# Patient Record
Sex: Male | Born: 1975 | Race: White | Hispanic: No | Marital: Married | Smoking: Former smoker
Health system: Southern US, Community
[De-identification: ages and names within clinical notes are randomized; demographics above are authoritative.]

## PROBLEM LIST (undated history)

## (undated) DIAGNOSIS — E785 Hyperlipidemia, unspecified: Secondary | ICD-10-CM

## (undated) DIAGNOSIS — Z720 Tobacco use: Secondary | ICD-10-CM

## (undated) DIAGNOSIS — I219 Acute myocardial infarction, unspecified: Secondary | ICD-10-CM

## (undated) DIAGNOSIS — Z8719 Personal history of other diseases of the digestive system: Secondary | ICD-10-CM

## (undated) DIAGNOSIS — K219 Gastro-esophageal reflux disease without esophagitis: Secondary | ICD-10-CM

## (undated) DIAGNOSIS — I10 Essential (primary) hypertension: Secondary | ICD-10-CM

## (undated) DIAGNOSIS — F32A Depression, unspecified: Secondary | ICD-10-CM

## (undated) DIAGNOSIS — J302 Other seasonal allergic rhinitis: Secondary | ICD-10-CM

## (undated) DIAGNOSIS — Q8740 Marfan's syndrome, unspecified: Secondary | ICD-10-CM

## (undated) DIAGNOSIS — M25561 Pain in right knee: Secondary | ICD-10-CM

## (undated) DIAGNOSIS — Z872 Personal history of diseases of the skin and subcutaneous tissue: Secondary | ICD-10-CM

## (undated) DIAGNOSIS — R7301 Impaired fasting glucose: Secondary | ICD-10-CM

## (undated) DIAGNOSIS — G8929 Other chronic pain: Secondary | ICD-10-CM

## (undated) HISTORY — DX: Other seasonal allergic rhinitis: J30.2

## (undated) HISTORY — DX: Impaired fasting glucose: R73.01

## (undated) HISTORY — DX: Essential (primary) hypertension: I10

## (undated) HISTORY — DX: Hyperlipidemia, unspecified: E78.5

## (undated) HISTORY — DX: Tobacco use: Z72.0

## (undated) HISTORY — DX: Marfan syndrome, unspecified: Q87.40

## (undated) HISTORY — DX: Pain in right knee: M25.561

## (undated) HISTORY — DX: Acute myocardial infarction, unspecified: I21.9

## (undated) HISTORY — DX: Depression, unspecified: F32.A

## (undated) HISTORY — DX: Personal history of diseases of the skin and subcutaneous tissue: Z87.2

## (undated) HISTORY — PX: TONSILECTOMY, ADENOIDECTOMY, BILATERAL MYRINGOTOMY AND TUBES: SHX2538

## (undated) HISTORY — DX: Personal history of other diseases of the digestive system: Z87.19

## (undated) HISTORY — DX: Gastro-esophageal reflux disease without esophagitis: K21.9

## (undated) HISTORY — DX: Other chronic pain: G89.29

---

## 1998-06-06 HISTORY — PX: OTHER SURGICAL HISTORY: SHX169

## 2004-02-06 HISTORY — PX: AORTIC VALVE SURGERY: SHX549

## 2006-07-30 ENCOUNTER — Ambulatory Visit: Payer: Self-pay | Admitting: Family Medicine

## 2012-02-06 DIAGNOSIS — Z8711 Personal history of peptic ulcer disease: Secondary | ICD-10-CM

## 2012-02-06 HISTORY — DX: Personal history of peptic ulcer disease: Z87.11

## 2012-06-27 ENCOUNTER — Ambulatory Visit: Payer: Self-pay | Admitting: Physician Assistant

## 2012-06-27 ENCOUNTER — Ambulatory Visit (INDEPENDENT_AMBULATORY_CARE_PROVIDER_SITE_OTHER): Payer: PRIVATE HEALTH INSURANCE | Admitting: Physician Assistant

## 2012-06-27 ENCOUNTER — Encounter: Payer: Self-pay | Admitting: Physician Assistant

## 2012-06-27 VITALS — BP 132/81 | HR 60 | Temp 97.0°F | Ht >= 80 in | Wt 300.0 lb

## 2012-06-27 DIAGNOSIS — I10 Essential (primary) hypertension: Secondary | ICD-10-CM | POA: Insufficient documentation

## 2012-06-27 DIAGNOSIS — M25569 Pain in unspecified knee: Secondary | ICD-10-CM

## 2012-06-27 DIAGNOSIS — F411 Generalized anxiety disorder: Secondary | ICD-10-CM | POA: Insufficient documentation

## 2012-06-27 MED ORDER — CITALOPRAM HYDROBROMIDE 20 MG PO TABS: 40.0000 mg | ORAL_TABLET | Freq: Every day | ORAL | Status: DC

## 2012-06-27 MED ORDER — METOPROLOL TARTRATE 100 MG PO TABS: 100.0000 mg | ORAL_TABLET | Freq: Two times a day (BID) | ORAL | Status: DC

## 2012-06-27 NOTE — Progress Notes (Signed)
Subjective:     Patient ID: Carl Bruce, male   DOB: [birth year], 37 y.o.   MRN: [id number]  HPI Pt here for review of HTN, anxiety, and knee pain He states he has been doing well Pt had episode of chest pain ~ 3 weeks ago and went to the ER Full work up there was negative and it was felt sx were due to stress He states he has been working a lot recently and stress at the work place has been high He has noted a shorter fuse than usually due to the stress His knee pain has been pretty good and he is having more issues with his feet than knees Pt has appt with his Card in the future regarding his aortic arch   Review of Systems  All other systems reviewed and are negative.       Objective:   Physical Exam  Nursing note and vitals reviewed. Constitutional: He appears well-developed and well-nourished.  Neck: Neck supple. No JVD present. No tracheal deviation present.  No bruits  Cardiovascular: Normal rate, regular rhythm, normal heart sounds and intact distal pulses.   No lower ext edema  Pulmonary/Chest: Effort normal and breath sounds normal.  Lymphadenopathy:    He has no cervical adenopathy.  Psychiatric: He has a normal mood and affect. His behavior is normal. Judgment and thought content normal.       Assessment:     1. HTN (hypertension)   2. Anxiety state, unspecified   3. Knee pain, unspecified laterality        Plan:     Will increase the Celexa to 40mg  qd Cont other meds Will get EKG/labs from recent hosp eval Cont regular f/u with Card Cont other meds

## 2012-06-27 NOTE — Patient Instructions (Signed)

## 2012-07-10 ENCOUNTER — Ambulatory Visit (INDEPENDENT_AMBULATORY_CARE_PROVIDER_SITE_OTHER): Payer: PRIVATE HEALTH INSURANCE | Admitting: Physician Assistant

## 2012-07-10 ENCOUNTER — Encounter: Payer: Self-pay | Admitting: Physician Assistant

## 2012-07-10 VITALS — BP 134/81 | HR 64 | Temp 97.2°F | Ht >= 80 in | Wt 310.0 lb

## 2012-07-10 DIAGNOSIS — M25561 Pain in right knee: Secondary | ICD-10-CM

## 2012-07-10 DIAGNOSIS — M25569 Pain in unspecified knee: Secondary | ICD-10-CM

## 2012-07-10 NOTE — Progress Notes (Signed)
  Subjective:    Patient ID: Carl Bruce, male    DOB: 1975/07/12, 37 y.o.   MRN: [id number]  HPI Pt with chronic intermit R knee pain He has been doing well with intermit use of Mobic There has been a recent flare of his R knee pain since having to kneel under a machine at work He would like injection that prev has worked well   Review of Systems  All other systems reviewed and are negative.       Objective:   Physical Exam  Nursing note and vitals reviewed. + effusion to the R knee FROM of the knee Sl click No laxity noted + TTP ant medial/lateral knee Good strength Betadine pre done to knee 1.5cc Mar/1.5cc Kenalog under sterile techniq. to the R knee Bandage placed        Assessment & Plan:  A- chronic knee pain P- Cont Mobic      Heat/Ice      Try to minimize kneeling      F/U prn

## 2012-07-10 NOTE — Patient Instructions (Signed)
Knee Pain  The knee is the complex joint between your thigh and your lower leg. It is made up of bones, tendons, ligaments, and cartilage. The bones that make up the knee are:   The femur in the thigh.   The tibia and fibula in the lower leg.   The patella or kneecap riding in the groove on the lower femur.  CAUSES   Knee pain is a common complaint with many causes. A few of these causes are:   Injury, such as:   A ruptured ligament or tendon injury.   Torn cartilage.   Medical conditions, such as:   Gout   Arthritis   Infections   Overuse, over training or overdoing a physical activity.  Knee pain can be minor or severe. Knee pain can accompany debilitating injury. Minor knee problems often respond well to self-care measures or get well on their own. More serious injuries may need medical intervention or even surgery.  SYMPTOMS  The knee is complex. Symptoms of knee problems can vary widely. Some of the problems are:   Pain with movement and weight bearing.   Swelling and tenderness.   Buckling of the knee.   Inability to straighten or extend your knee.   Your knee locks and you cannot straighten it.   Warmth and redness with pain and fever.   Deformity or dislocation of the kneecap.  DIAGNOSIS   Determining what is wrong may be very straight forward such as when there is an injury. It can also be challenging because of the complexity of the knee. Tests to make a diagnosis may include:   Your caregiver taking a history and doing a physical exam.   Routine X-rays can be used to rule out other problems. X-rays will not reveal a cartilage tear. Some injuries of the knee can be diagnosed by:   Arthroscopy a surgical technique by which a small video camera is inserted through tiny incisions on the sides of the knee. This procedure is used to examine and repair internal knee joint problems. Tiny instruments can be used during arthroscopy to repair the torn knee cartilage (meniscus).   Arthrography  is a radiology technique. A contrast liquid is directly injected into the knee joint. Internal structures of the knee joint then become visible on X-ray film.   An MRI scan is a non x-ray radiology procedure in which magnetic fields and a computer produce two- or three-dimensional images of the inside of the knee. Cartilage tears are often visible using an MRI scanner. MRI scans have largely replaced arthrography in diagnosing cartilage tears of the knee.   Blood work.   Examination of the fluid that helps to lubricate the knee joint (synovial fluid). This is done by taking a sample out using a needle and a syringe.  TREATMENT  The treatment of knee problems depends on the cause. Some of these treatments are:   Depending on the injury, proper casting, splinting, surgery or physical therapy care will be needed.   Give yourself adequate recovery time. Do not overuse your joints. If you begin to get sore during workout routines, back off. Slow down or do fewer repetitions.   For repetitive activities such as cycling or running, maintain your strength and nutrition.   Alternate muscle groups. For example if you are a weight lifter, work the upper body on one day and the lower body the next.   Either tight or weak muscles do not give the proper support for your   knee. Tight or weak muscles do not absorb the stress placed on the knee joint. Keep the muscles surrounding the knee strong.   Take care of mechanical problems.   If you have flat feet, orthotics or special shoes may help. See your caregiver if you need help.   Arch supports, sometimes with wedges on the inner or outer aspect of the heel, can help. These can shift pressure away from the side of the knee most bothered by osteoarthritis.   A brace called an "unloader" brace also may be used to help ease the pressure on the most arthritic side of the knee.   If your caregiver has prescribed crutches, braces, wraps or ice, use as directed. The acronym for  this is PRICE. This means protection, rest, ice, compression and elevation.   Nonsteroidal anti-inflammatory drugs (NSAID's), can help relieve pain. But if taken immediately after an injury, they may actually increase swelling. Take NSAID's with food in your stomach. Stop them if you develop stomach problems. Do not take these if you have a history of ulcers, stomach pain or bleeding from the bowel. Do not take without your caregiver's approval if you have problems with fluid retention, heart failure, or kidney problems.   For ongoing knee problems, physical therapy may be helpful.   Glucosamine and chondroitin are over-the-counter dietary supplements. Both may help relieve the pain of osteoarthritis in the knee. These medicines are different from the usual anti-inflammatory drugs. Glucosamine may decrease the rate of cartilage destruction.   Injections of a corticosteroid drug into your knee joint may help reduce the symptoms of an arthritis flare-up. They may provide pain relief that lasts a few months. You may have to wait a few months between injections. The injections do have a small increased risk of infection, water retention and elevated blood sugar levels.   Hyaluronic acid injected into damaged joints may ease pain and provide lubrication. These injections may work by reducing inflammation. A series of shots may give relief for as long as 6 months.   Topical painkillers. Applying certain ointments to your skin may help relieve the pain and stiffness of osteoarthritis. Ask your pharmacist for suggestions. Many over the-counter products are approved for temporary relief of arthritis pain.   In some countries, doctors often prescribe topical NSAID's for relief of chronic conditions such as arthritis and tendinitis. A review of treatment with NSAID creams found that they worked as well as oral medications but without the serious side effects.  PREVENTION   Maintain a healthy weight. Extra pounds put  more strain on your joints.   Get strong, stay limber. Weak muscles are a common cause of knee injuries. Stretching is important. Include flexibility exercises in your workouts.   Be smart about exercise. If you have osteoarthritis, chronic knee pain or recurring injuries, you may need to change the way you exercise. This does not mean you have to stop being active. If your knees ache after jogging or playing basketball, consider switching to swimming, water aerobics or other low-impact activities, at least for a few days a week. Sometimes limiting high-impact activities will provide relief.   Make sure your shoes fit well. Choose footwear that is right for your sport.   Protect your knees. Use the proper gear for knee-sensitive activities. Use kneepads when playing volleyball or laying carpet. Buckle your seat belt every time you drive. Most shattered kneecaps occur in car accidents.   Rest when you are tired.  SEEK MEDICAL CARE IF:     You have knee pain that is continual and does not seem to be getting better.   SEEK IMMEDIATE MEDICAL CARE IF:   Your knee joint feels hot to the touch and you have a high fever.  MAKE SURE YOU:    Understand these instructions.   Will watch your condition.   Will get help right away if you are not doing well or get worse.  Document Released: 11/19/2006 Document Revised: 04/16/2011 Document Reviewed: 11/19/2006  [employer] Patient Information 2014 [employer].

## 2012-09-02 ENCOUNTER — Telehealth: Payer: Self-pay | Admitting: *Deleted

## 2012-09-02 NOTE — Telephone Encounter (Signed)
Please set up for referral Knee pain failure of NSAIDS and joint injections

## 2012-09-02 NOTE — Telephone Encounter (Signed)
Can we please referral him to [facility] here at our office for knee pain please.

## 2012-09-05 ENCOUNTER — Other Ambulatory Visit: Payer: Self-pay

## 2012-09-05 DIAGNOSIS — M25561 Pain in right knee: Secondary | ICD-10-CM

## 2012-09-11 ENCOUNTER — Other Ambulatory Visit: Payer: Self-pay | Admitting: Orthopedic Surgery

## 2012-09-11 ENCOUNTER — Ambulatory Visit (INDEPENDENT_AMBULATORY_CARE_PROVIDER_SITE_OTHER): Payer: PRIVATE HEALTH INSURANCE

## 2012-09-11 DIAGNOSIS — R52 Pain, unspecified: Secondary | ICD-10-CM

## 2012-11-04 ENCOUNTER — Encounter: Payer: Self-pay | Admitting: Nurse Practitioner

## 2012-11-04 ENCOUNTER — Ambulatory Visit (INDEPENDENT_AMBULATORY_CARE_PROVIDER_SITE_OTHER): Payer: PRIVATE HEALTH INSURANCE | Admitting: Nurse Practitioner

## 2012-11-04 ENCOUNTER — Ambulatory Visit (INDEPENDENT_AMBULATORY_CARE_PROVIDER_SITE_OTHER): Payer: PRIVATE HEALTH INSURANCE

## 2012-11-04 VITALS — BP 149/84 | HR 69 | Temp 97.1°F | Ht >= 80 in | Wt 313.0 lb

## 2012-11-04 DIAGNOSIS — M79609 Pain in unspecified limb: Secondary | ICD-10-CM

## 2012-11-04 DIAGNOSIS — M79641 Pain in right hand: Secondary | ICD-10-CM

## 2012-11-04 IMAGING — CR DG HAND COMPLETE 3+V*R*
3 series · 3 of 3 positions shown · non-contrast
Comparison: None.

CLINICAL DATA: Pain

EXAM:
RIGHT HAND - COMPLETE 3+ VIEW

[view not recorded (1 of 3)]
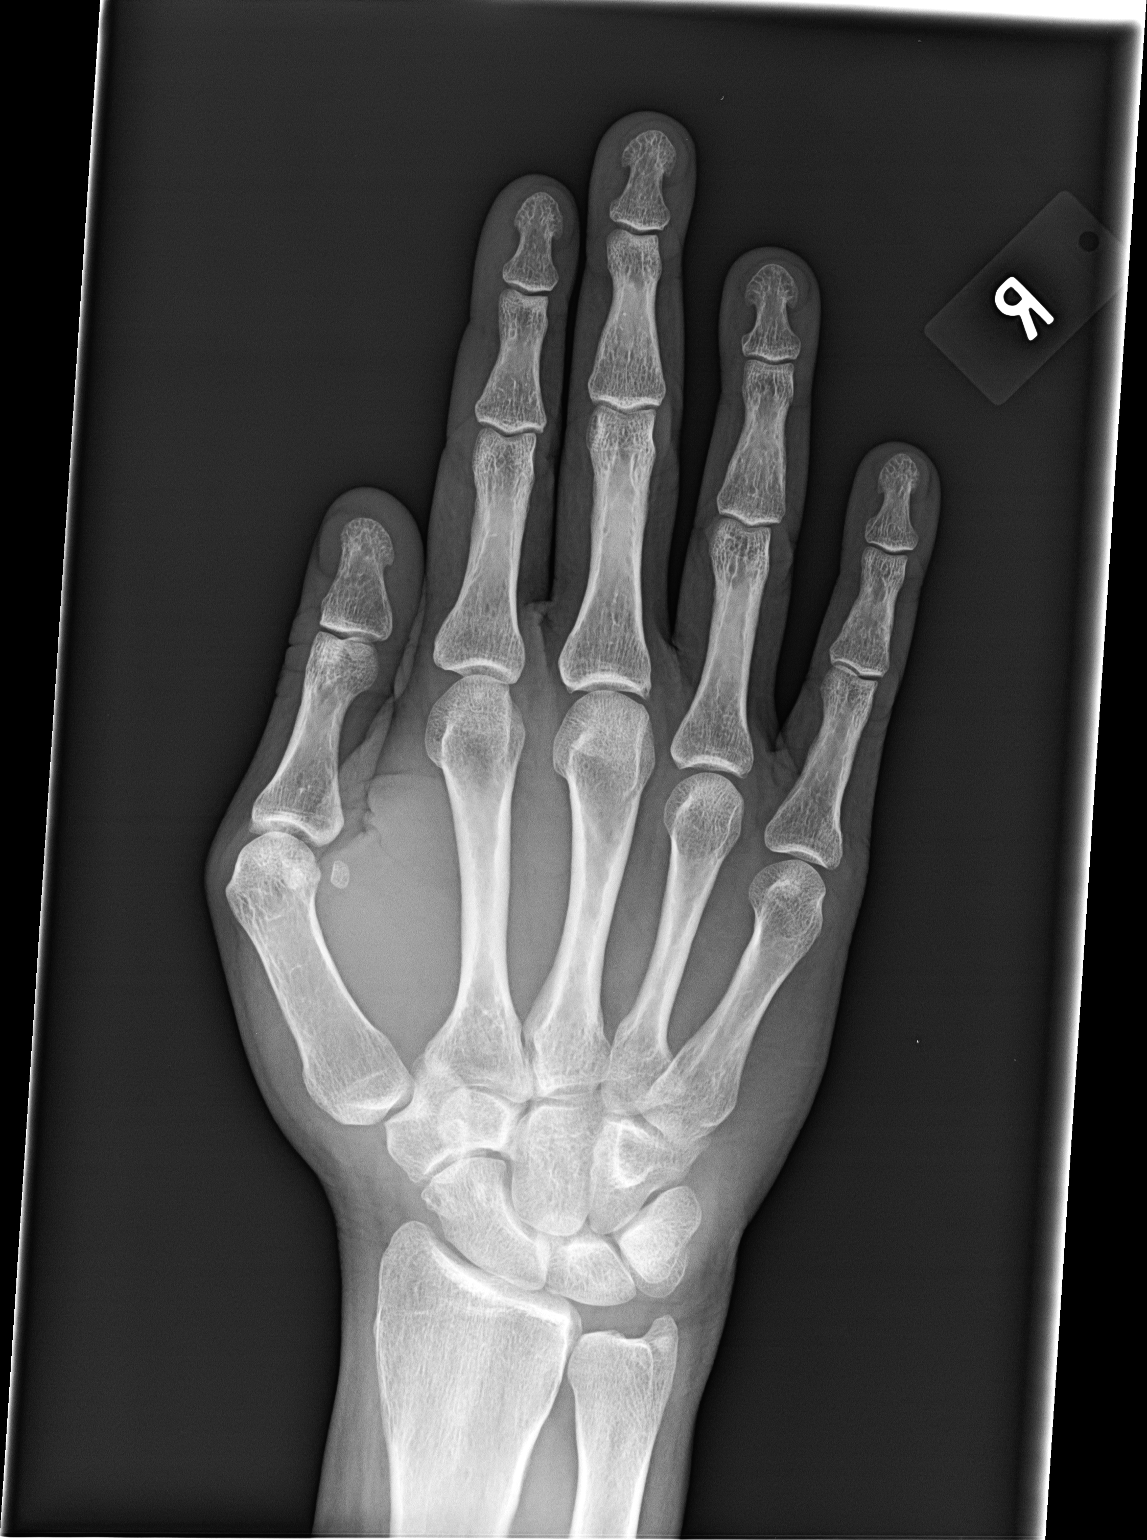

[view not recorded (2 of 3)]
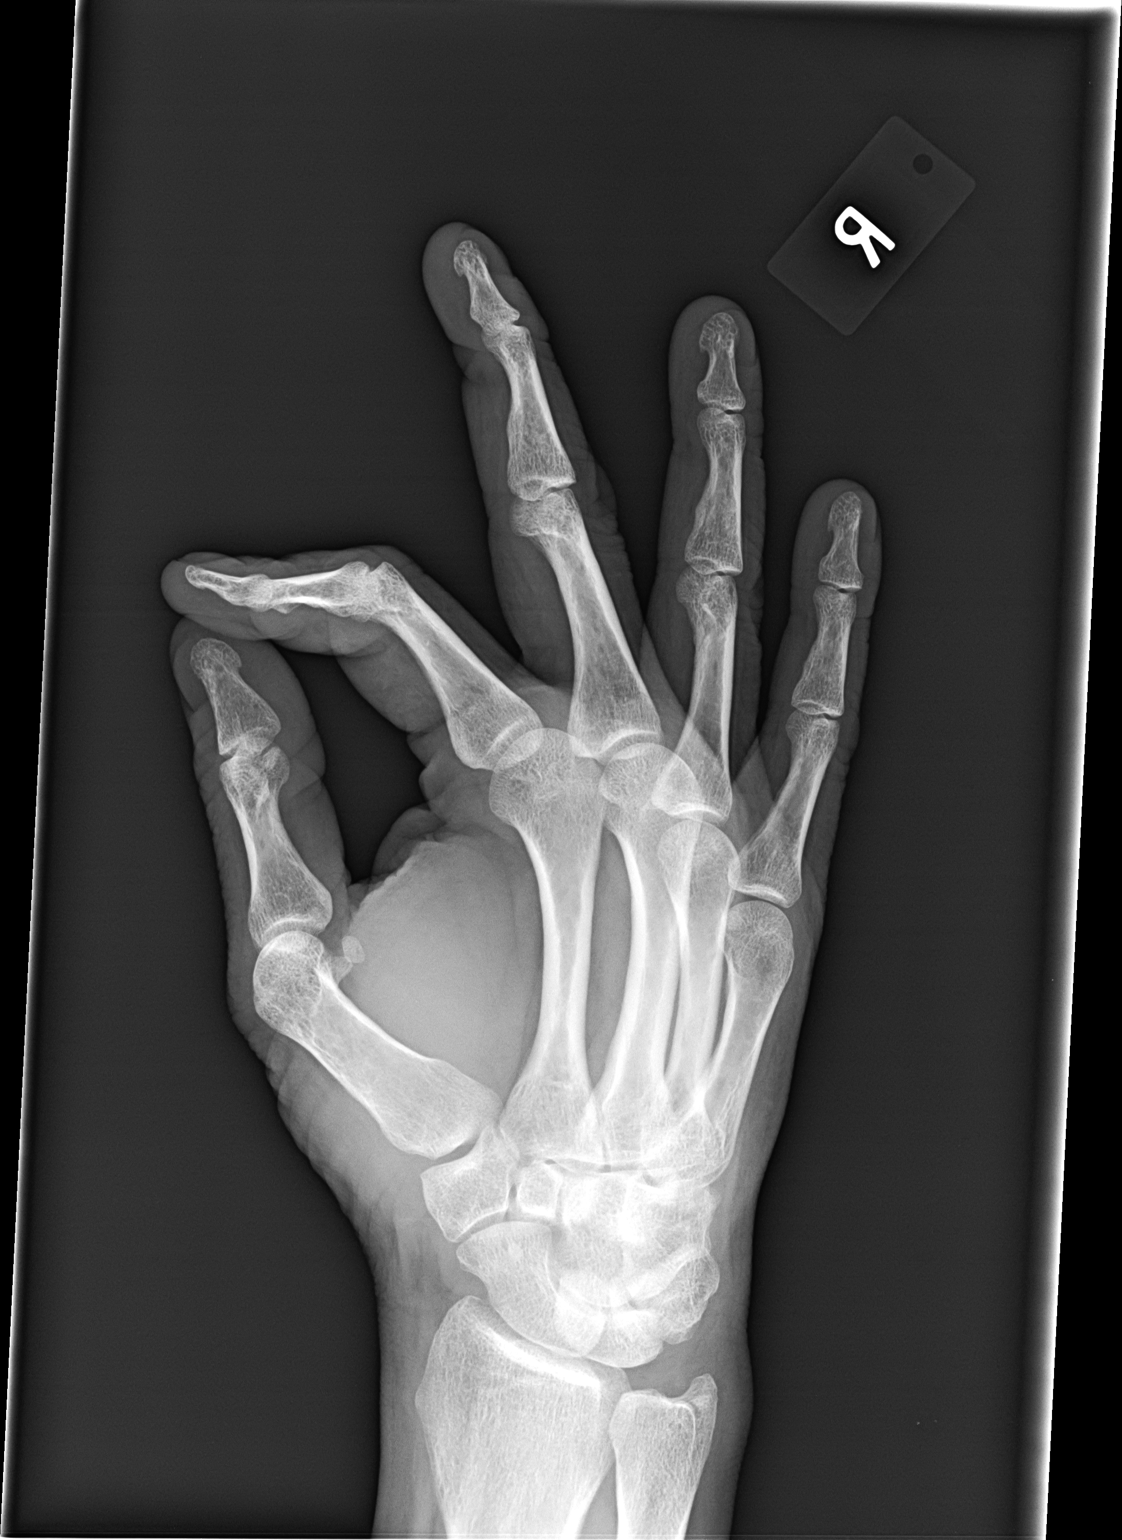

[view not recorded (3 of 3)]
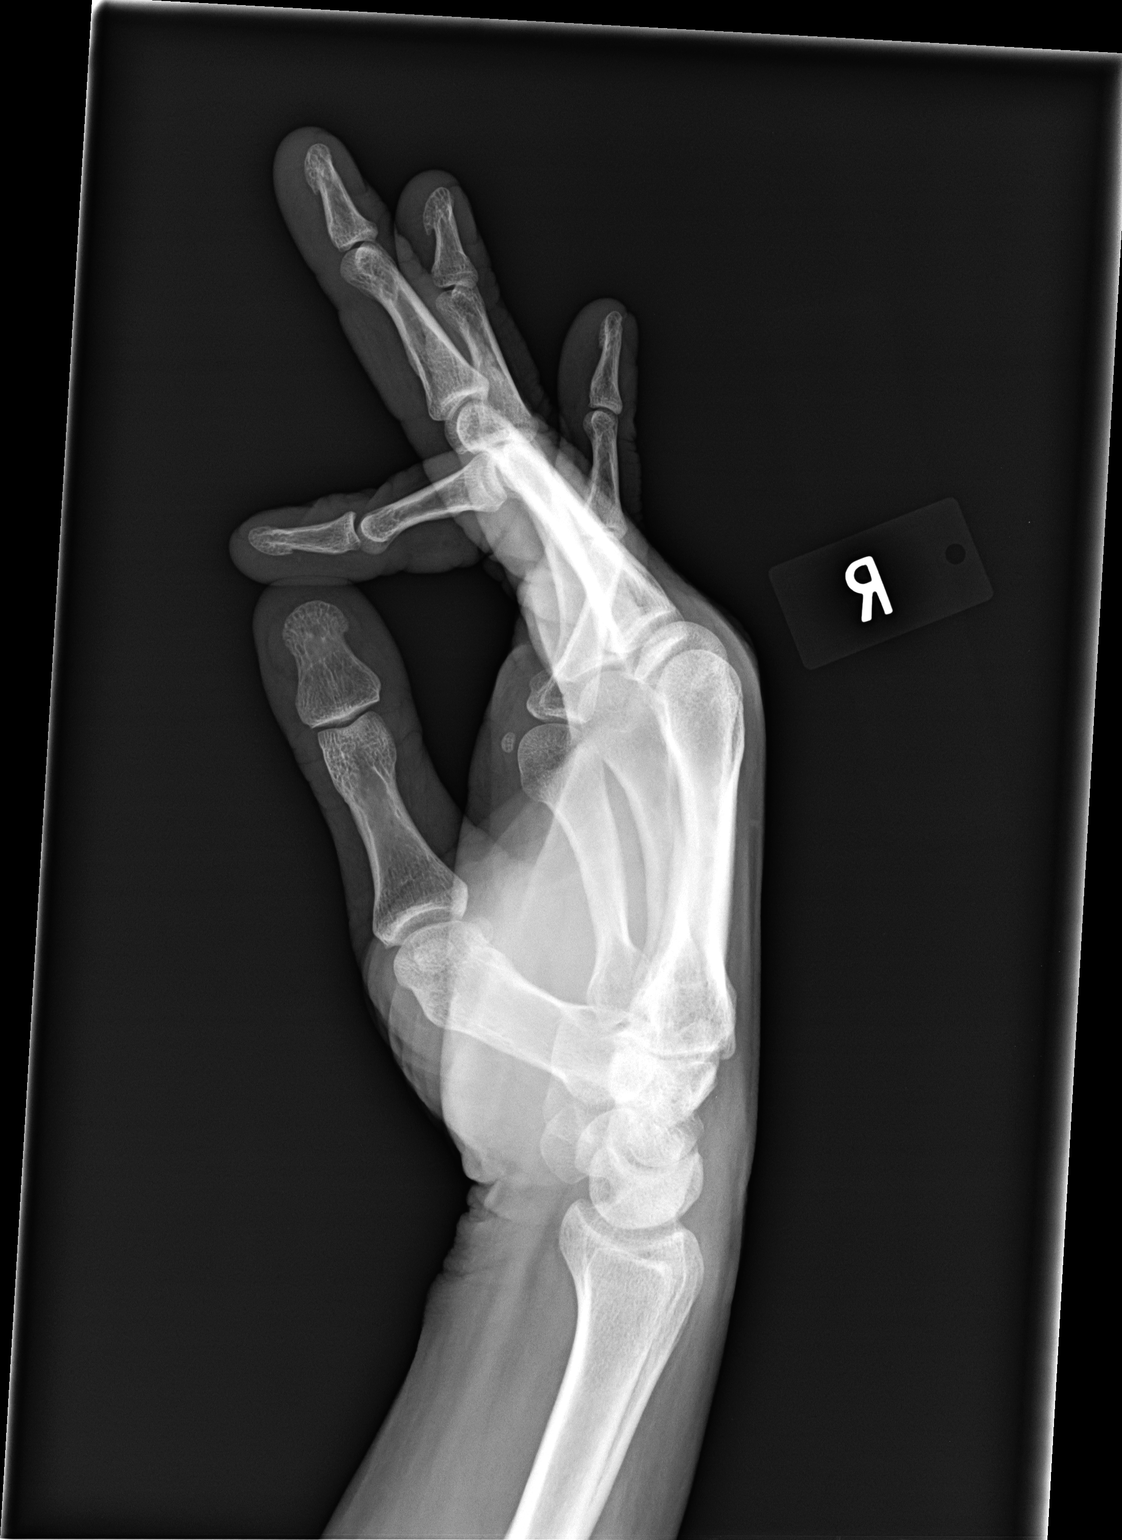

[3 of 3 positions shown; findings below may reference images not displayed]

FINDINGS: Frontal, oblique, and lateral views were obtained. There is no
fracture or dislocation. The joint spaces appear intact. No erosive
change.
IMPRESSION: No fracture or appreciable arthropathy. Note that on submitted
images, the patient does persistently flex at the 1st MCP joint. The
significance of this finding is uncertain.

## 2012-11-04 MED ORDER — HYDROCODONE-ACETAMINOPHEN 5-325 MG PO TABS: 1.0000 | ORAL_TABLET | Freq: Four times a day (QID) | ORAL | Status: DC | PRN

## 2012-11-04 MED ORDER — PREDNISONE (PAK) 10 MG PO TABS: 10.0000 mg | ORAL_TABLET | Freq: Every day | ORAL | Status: DC

## 2012-11-04 NOTE — Patient Instructions (Signed)
Arthritis, Nonspecific  Arthritis is pain, redness, warmth, or puffiness (inflammation) of a joint. The joint may be stiff or hurt when you move it. One or more joints may be affected. There are many types of arthritis. Your doctor may not know what type you have right away. The most common cause of arthritis is wear and tear on the joint (osteoarthritis).  HOME CARE   · Only take medicine as told by your doctor.  · Rest the joint as much as possible.  · Raise (elevate) your joint if it is puffy.  · Use crutches if the painful joint is in your leg.  · Drink enough fluids to keep your pee (urine) clear or pale yellow.  · Follow your doctor's diet instructions.  · Use cold packs for very bad joint pain for 10 to 15 minutes every hour. Ask your doctor if it is okay for you to use hot packs.  · Exercise as told by your doctor.  · Take a warm shower if you have stiffness in the morning.  · Move your sore joints throughout the day.  GET HELP RIGHT AWAY IF:   · You have a fever.  · You have very bad joint pain, puffiness, or redness.  · You have many joints that are painful and puffy.  · You are not getting better with treatment.  · You have very bad back pain or leg weakness.  · You cannot control when you poop (bowel movement) or pee (urinate).  · You do not feel better in 24 hours or are getting worse.  · You are having side effects from your medicine.  MAKE SURE YOU:   · Understand these instructions.  · Will watch your condition.  · Will get help right away if you are not doing well or get worse.  Document Released: 04/18/2009 Document Revised: 07/24/2011 Document Reviewed: 04/18/2009  [employer]® Patient Information ©2014 [employer].

## 2012-11-04 NOTE — Progress Notes (Signed)
  Subjective:    Patient ID: Carl Bruce, male    DOB: 1975/07/18, 37 y.o.   MRN: [id number]  HPI Patient in c/o pain in right hand- started several days ago- any movement of his hand causes pain.    Review of Systems  All other systems reviewed and are negative.       Objective:   Physical Exam  Constitutional: He appears well-developed and well-nourished.  Cardiovascular: Normal rate, regular rhythm and normal heart sounds.   Pulmonary/Chest: Effort normal and breath sounds normal.  Musculoskeletal:  Mild edema right 1st and 2nd metacarpal heads- pain on full extension of fingers.  Grips + bil.   Xray- no fracture- Preliminary reading by Paulene Floor, FNP  [facility] BP 149/84  Pulse 69  Temp(Src) 97.1 F (36.2 C) (Oral)  Ht 7' (2.134 m)  Wt 313 lb (141.976 kg)  BMI 31.18 kg/m2        Assessment & Plan:   1. Hand pain, right    Orders Placed This Encounter  Procedures  . DG Hand Complete Right    Standing Status: Future     Number of Occurrences: 1     Standing Expiration Date: 01/04/2014    Order Specific Question:  Reason for Exam (SYMPTOM  OR DIAGNOSIS REQUIRED)    Answer:  pain    Order Specific Question:  Preferred imaging location?    Answer:  Internal  . Arthritis Panel   Meds ordered this encounter  Medications  . predniSONE (STERAPRED UNI-PAK) 10 MG tablet    Sig: Take 1 tablet (10 mg total) by mouth daily. As directed X6 days    Dispense:  21 tablet    Refill:  0    Order Specific Question:  Supervising Provider    Answer:  Ernestina Penna [[id number]]  . HYDROcodone-acetaminophen (NORCO/VICODIN) 5-325 MG per tablet    Sig: Take 1 tablet by mouth every 6 (six) hours as needed for pain.    Dispense:  30 tablet    Refill:  0    Order Specific Question:  Supervising Provider    Answer:  Ernestina Penna [[id number]]  rest Ice hand if helps  Mary-Margaret Daphine Deutscher, FNP

## 2012-11-05 LAB — ARTHRITIS PANEL
Basos: 1 %
Eosinophils Absolute: 0.4 10*3/uL (ref 0.0–0.4)
HCT: 43.1 % (ref 37.5–51.0)
Hemoglobin: 14.3 g/dL (ref 12.6–17.7)
MCH: 27.5 pg (ref 26.6–33.0)
MCHC: 33.2 g/dL (ref 31.5–35.7)
Monocytes Absolute: 0.7 10*3/uL (ref 0.1–0.9)
Neutrophils Absolute: 4.8 10*3/uL (ref 1.4–7.0)
Sed Rate: 3 mm/hr (ref 0–15)
WBC: 8.8 10*3/uL (ref 3.4–10.8)

## 2013-01-16 ENCOUNTER — Encounter: Payer: Self-pay | Admitting: Nurse Practitioner

## 2013-01-16 ENCOUNTER — Ambulatory Visit (INDEPENDENT_AMBULATORY_CARE_PROVIDER_SITE_OTHER): Payer: PRIVATE HEALTH INSURANCE | Admitting: Nurse Practitioner

## 2013-01-16 VITALS — BP 138/77 | HR 73 | Temp 97.2°F | Ht >= 80 in | Wt 311.0 lb

## 2013-01-16 DIAGNOSIS — I10 Essential (primary) hypertension: Secondary | ICD-10-CM

## 2013-01-16 DIAGNOSIS — Z23 Encounter for immunization: Secondary | ICD-10-CM

## 2013-01-16 DIAGNOSIS — Q8740 Marfan's syndrome, unspecified: Secondary | ICD-10-CM

## 2013-01-16 DIAGNOSIS — Z0000 Encounter for general adult medical examination without abnormal findings: Secondary | ICD-10-CM

## 2013-01-16 DIAGNOSIS — Q87410 Marfan's syndrome with aortic dilation: Secondary | ICD-10-CM | POA: Insufficient documentation

## 2013-01-16 NOTE — Patient Instructions (Signed)

## 2013-01-16 NOTE — Addendum Note (Signed)
Addended by: Almeta Monas on: 01/16/2013 11:04 AM   Modules accepted: Orders

## 2013-01-16 NOTE — Progress Notes (Signed)
   Subjective:    Patient ID: Carl Bruce, male    DOB: October 04, 1975, 37 y.o.   MRN: [id number]  HPI Patient here today for CPE- he is doing well without complaints today.  Patient Active Problem List   Diagnosis Date Noted  . Marfan's syndrome with aortic dilation 01/16/2013  . HTN (hypertension) 06/27/2012  . Anxiety state, unspecified 06/27/2012    Outpatient Encounter Prescriptions as of 01/16/2013  Medication Sig  . citalopram (CELEXA) 20 MG tablet Take 20 mg by mouth daily.  . meloxicam (MOBIC) 15 MG tablet Take 15 mg by mouth as needed for pain.  . metoprolol (LOPRESSOR) 100 MG tablet Take 1 tablet (100 mg total) by mouth 2 (two) times daily.  . [DISCONTINUED] HYDROcodone-acetaminophen (NORCO/VICODIN) 5-325 MG per tablet Take 1 tablet by mouth every 6 (six) hours as needed for pain.  . [DISCONTINUED] predniSONE (STERAPRED UNI-PAK) 10 MG tablet Take 1 tablet (10 mg total) by mouth daily. As directed X6 days        Review of Systems  Constitutional: Negative.   HENT: Negative.   Respiratory: Negative.   Cardiovascular: Negative.   Gastrointestinal: Negative.   Genitourinary: Negative.   Musculoskeletal: Negative.   Neurological: Negative.   Hematological: Negative.   Psychiatric/Behavioral: Negative.   All other systems reviewed and are negative.       Objective:   Physical Exam  Constitutional: He is oriented to person, place, and time. He appears well-developed and well-nourished.  HENT:  Head: Normocephalic.  Right Ear: External ear normal.  Left Ear: External ear normal.  Nose: Nose normal.  Mouth/Throat: Oropharynx is clear and moist.  Eyes: EOM are normal. Pupils are equal, round, and reactive to light.  Neck: Normal range of motion. Neck supple. No JVD present. No thyromegaly present.  Cardiovascular: Normal rate, regular rhythm, normal heart sounds and intact distal pulses.  Exam reveals no gallop and no friction rub.   No murmur  heard. Pulmonary/Chest: Effort normal and breath sounds normal. No respiratory distress. He has no wheezes. He has no rales. He exhibits no tenderness.  Abdominal: Soft. Bowel sounds are normal. He exhibits no mass. There is no tenderness.  Musculoskeletal: Normal range of motion. He exhibits no edema.  Lymphadenopathy:    He has no cervical adenopathy.  Neurological: He is alert and oriented to person, place, and time. No cranial nerve deficit.  Skin: Skin is warm and dry.  Psychiatric: He has a normal mood and affect. His behavior is normal. Judgment and thought content normal.      BP 138/77  Pulse 73  Temp(Src) 97.2 F (36.2 C) (Oral)  Ht 7' (2.134 m)  Wt 311 lb (141.069 kg)  BMI 30.98 kg/m2 EKG- unchanged from previous.    Assessment & Plan:   1. Marfan's syndrome with aortic dilation   2. HTN (hypertension)    Orders Placed This Encounter  Procedures  . CMP14+EGFR  . NMR, lipoprofile  . EKG 12-Lead   Tetanus shot today Continue all meds Labs pending Diet and exercise encouraged Health maintenance reviewed Follow up in 6 months  Mary-Margaret Daphine Deutscher, FNP

## 2013-01-18 LAB — NMR, LIPOPROFILE
Cholesterol: 211 mg/dL — ABNORMAL HIGH (ref <200)
HDL Cholesterol by NMR: 42 mg/dL (ref >=40)
HDL Particle Number: 36.8 umol/L (ref >=30.5)
LP-IR Score: 83 — ABNORMAL HIGH (ref <=45)
Small LDL Particle Number: 1433 nmol/L — ABNORMAL HIGH (ref <=527)

## 2013-01-18 LAB — CMP14+EGFR
ALT: 48 IU/L — ABNORMAL HIGH (ref 0–44)
AST: 25 IU/L (ref 0–40)
Albumin/Globulin Ratio: 2.3 (ref 1.1–2.5)
Albumin: 4.9 g/dL (ref 3.5–5.5)
Alkaline Phosphatase: 78 IU/L (ref 39–117)
BUN/Creatinine Ratio: 14 (ref 8–19)
BUN: 14 mg/dL (ref 6–20)
Calcium: 10.2 mg/dL (ref 8.7–10.2)
Creatinine, Ser: 1 mg/dL (ref 0.76–1.27)
GFR calc Af Amer: 111 mL/min/{1.73_m2} (ref >59)
GFR calc non Af Amer: 96 mL/min/{1.73_m2} (ref >59)
Globulin, Total: 2.1 g/dL (ref 1.5–4.5)
Sodium: 140 mmol/L (ref 134–144)

## 2013-01-20 ENCOUNTER — Telehealth: Payer: Self-pay | Admitting: *Deleted

## 2013-01-20 NOTE — Telephone Encounter (Signed)
Will NTBS to get a work note

## 2013-01-20 NOTE — Telephone Encounter (Signed)
Patient is down with his back and having pain that goes down to his knees. Can he get a work note for a few days until he gets things straightened out. He already has pain meds that were given at last visit. If he needs to be seen he will be happy to come in this afternoon. Not sure if he will use work note but would like to have it just in case. Please advise

## 2013-01-26 NOTE — Telephone Encounter (Signed)
Wife aware

## 2013-04-23 ENCOUNTER — Other Ambulatory Visit: Payer: Self-pay | Admitting: Nurse Practitioner

## 2013-04-23 ENCOUNTER — Other Ambulatory Visit (INDEPENDENT_AMBULATORY_CARE_PROVIDER_SITE_OTHER): Payer: 59

## 2013-04-23 DIAGNOSIS — R5383 Other fatigue: Principal | ICD-10-CM

## 2013-04-23 DIAGNOSIS — R5381 Other malaise: Secondary | ICD-10-CM

## 2013-04-23 NOTE — Progress Notes (Signed)
Patient came in for labs only.

## 2013-04-24 LAB — ANEMIA PROFILE B
Basophils Absolute: 0 10*3/uL (ref 0.0–0.2)
Basos: 0 %
EOS: 5 %
Eosinophils Absolute: 0.3 10*3/uL (ref 0.0–0.4)
FERRITIN: 90 ng/mL (ref 30–400)
Folate: 14.3 ng/mL (ref >3.0)
HCT: 46.2 % (ref 37.5–51.0)
Hemoglobin: 15.8 g/dL (ref 12.6–17.7)
IRON: 69 ug/dL (ref 40–155)
Immature Grans (Abs): 0.1 10*3/uL (ref 0.0–0.1)
Immature Granulocytes: 1 %
Iron Saturation: 21 % (ref 15–55)
LYMPHS ABS: 2.4 10*3/uL (ref 0.7–3.1)
Lymphs: 35 %
MCH: 26.9 pg (ref 26.6–33.0)
MCHC: 34.2 g/dL (ref 31.5–35.7)
MCV: 79 fL (ref 79–97)
Monocytes Absolute: 0.5 10*3/uL (ref 0.1–0.9)
Monocytes: 8 %
Neutrophils Absolute: 3.5 10*3/uL (ref 1.4–7.0)
Neutrophils Relative %: 51 %
Platelets: 246 10*3/uL (ref 150–379)
RBC: 5.87 x10E6/uL — AB (ref 4.14–5.80)
RDW: 14.1 % (ref 12.3–15.4)
Retic Ct Pct: 2.1 % (ref 0.6–2.6)
TIBC: 322 ug/dL (ref 250–450)
UIBC: 253 ug/dL (ref 150–375)
Vitamin B-12: 478 pg/mL (ref 211–946)
WBC: 6.9 10*3/uL (ref 3.4–10.8)

## 2013-04-24 LAB — CMP14+EGFR
ALK PHOS: 89 IU/L (ref 39–117)
ALT: 40 IU/L (ref 0–44)
AST: 22 IU/L (ref 0–40)
Albumin/Globulin Ratio: 1.8 (ref 1.1–2.5)
Albumin: 4.6 g/dL (ref 3.5–5.5)
BILIRUBIN TOTAL: 0.5 mg/dL (ref 0.0–1.2)
BUN/Creatinine Ratio: 11 (ref 8–19)
BUN: 12 mg/dL (ref 6–20)
CO2: 26 mmol/L (ref 18–29)
CREATININE: 1.07 mg/dL (ref 0.76–1.27)
Calcium: 9.9 mg/dL (ref 8.7–10.2)
Chloride: 100 mmol/L (ref 97–108)
GFR calc Af Amer: 102 mL/min/{1.73_m2} (ref >59)
GFR calc non Af Amer: 88 mL/min/{1.73_m2} (ref >59)
Globulin, Total: 2.5 g/dL (ref 1.5–4.5)
Glucose: 94 mg/dL (ref 65–99)
Potassium: 5.2 mmol/L (ref 3.5–5.2)
Sodium: 144 mmol/L (ref 134–144)
Total Protein: 7.1 g/dL (ref 6.0–8.5)

## 2013-04-24 LAB — THYROID PANEL WITH TSH
Free Thyroxine Index: 1.6 (ref 1.2–4.9)
T3 Uptake Ratio: 24 % (ref 24–39)
T4, Total: 6.7 ug/dL (ref 4.5–12.0)
TSH: 2.26 u[IU]/mL (ref 0.450–4.500)

## 2013-05-05 ENCOUNTER — Other Ambulatory Visit: Payer: Self-pay | Admitting: Physician Assistant

## 2013-05-18 ENCOUNTER — Encounter: Payer: Self-pay | Admitting: Nurse Practitioner

## 2013-05-18 ENCOUNTER — Other Ambulatory Visit: Payer: Self-pay | Admitting: Nurse Practitioner

## 2013-05-18 ENCOUNTER — Ambulatory Visit (INDEPENDENT_AMBULATORY_CARE_PROVIDER_SITE_OTHER): Payer: 59 | Admitting: Nurse Practitioner

## 2013-05-18 VITALS — BP 110/65 | HR 58 | Temp 97.0°F | Ht >= 80 in | Wt 318.0 lb

## 2013-05-18 DIAGNOSIS — R234 Changes in skin texture: Secondary | ICD-10-CM

## 2013-05-18 DIAGNOSIS — L988 Other specified disorders of the skin and subcutaneous tissue: Secondary | ICD-10-CM

## 2013-05-18 MED ORDER — ATORVASTATIN CALCIUM 40 MG PO TABS: 40.0000 mg | ORAL_TABLET | Freq: Every day | ORAL | Status: DC

## 2013-05-18 NOTE — Progress Notes (Signed)
   Subjective:    Patient ID: Carl Bruce, male    DOB: Nov 21, 1975, [age] y.o.   MRN: [id number]  HPI Patient in c/o fissure bottom of left first toe- Wife has been using antibiotic ointment on it and soak it in salt water- seems to be getting worse and hurts to walk on . He has had this for 5-6 months.    Review of Systems  All other systems reviewed and are negative.      Objective:   Physical Exam  Constitutional: He appears well-developed and well-nourished.  Cardiovascular: Normal rate, regular rhythm and normal heart sounds.   Pulmonary/Chest: Effort normal and breath sounds normal.  Skin: Skin is warm and dry.  Deep fissure 2.5 cm long bast of left first toe.   BP 110/65  Pulse 58  Temp(Src) 97 F (36.1 C) (Oral)  Ht 7' (2.134 m)  Wt 318 lb (144.244 kg)  BMI 31.67 kg/m2        Assessment & Plan:

## 2013-05-26 ENCOUNTER — Encounter (HOSPITAL_BASED_OUTPATIENT_CLINIC_OR_DEPARTMENT_OTHER): Payer: 59 | Attending: General Surgery

## 2013-06-04 ENCOUNTER — Other Ambulatory Visit: Payer: Self-pay | Admitting: Physician Assistant

## 2013-06-08 ENCOUNTER — Telehealth: Payer: Self-pay | Admitting: Family Medicine

## 2013-06-08 ENCOUNTER — Other Ambulatory Visit: Payer: Self-pay | Admitting: Nurse Practitioner

## 2013-06-08 MED ORDER — ROSUVASTATIN CALCIUM 10 MG PO TABS: 10.0000 mg | ORAL_TABLET | Freq: Every day | ORAL | Status: DC

## 2013-06-08 NOTE — Telephone Encounter (Signed)
FYI  Tried to take the Lipitor but made his joints hurt

## 2013-06-08 NOTE — Telephone Encounter (Signed)
Really needss to be on statin- will he agree to try crestor?

## 2013-06-08 NOTE — Telephone Encounter (Signed)
rx for crestor sent to pharmacy- leave patient coupon up front to pick up and take to pharmacy

## 2013-06-08 NOTE — Telephone Encounter (Signed)
Just go ahead and send it in

## 2013-06-08 NOTE — Telephone Encounter (Signed)
PAtient aware 

## 2013-08-12 ENCOUNTER — Telehealth: Payer: Self-pay | Admitting: Family Medicine

## 2013-08-12 NOTE — Telephone Encounter (Signed)
Will you please put a referral in for Carl Bruce to see Dr Mer[facility]ucio [facility] when he comes here to office on August 6? He just needs to review his MRI and decide what to do next. Thank you

## 2013-08-13 ENCOUNTER — Other Ambulatory Visit: Payer: Self-pay | Admitting: Family Medicine

## 2013-08-13 MED ORDER — METOPROLOL TARTRATE 100 MG PO TABS: ORAL_TABLET | ORAL | Status: DC

## 2013-08-14 NOTE — Telephone Encounter (Signed)
Already taken care of

## 2013-09-10 ENCOUNTER — Ambulatory Visit (INDEPENDENT_AMBULATORY_CARE_PROVIDER_SITE_OTHER): Payer: 59

## 2013-09-10 ENCOUNTER — Other Ambulatory Visit: Payer: 59

## 2013-09-10 ENCOUNTER — Other Ambulatory Visit: Payer: Self-pay | Admitting: Orthopedic Surgery

## 2013-09-10 DIAGNOSIS — M25569 Pain in unspecified knee: Secondary | ICD-10-CM

## 2013-09-10 DIAGNOSIS — M25562 Pain in left knee: Principal | ICD-10-CM

## 2013-09-10 DIAGNOSIS — M25561 Pain in right knee: Secondary | ICD-10-CM

## 2013-09-17 DIAGNOSIS — Z01818 Encounter for other preprocedural examination: Secondary | ICD-10-CM | POA: Insufficient documentation

## 2013-09-18 ENCOUNTER — Ambulatory Visit: Payer: Self-pay | Admitting: Orthopedic Surgery

## 2013-09-18 NOTE — Progress Notes (Signed)
Preoperative surgical orders have been place into the Epic hospital system for Carl Bruce on 09/18/2013, 1:03 PM  by Patrica DuelPERKINS, ALEXZANDREW for surgery on 10/07/2013.  Preop Knee Scope orders including IV Tylenol and IV Decadron as long as there are no contraindications to the above medications. Avel Peacerew Perkins, PA-C

## 2013-10-01 ENCOUNTER — Other Ambulatory Visit (HOSPITAL_COMMUNITY): Payer: Self-pay | Admitting: Anesthesiology

## 2013-10-01 NOTE — Progress Notes (Signed)
CARDIAC CLEARANCE NOTE 09-17-13 DR PU BAPTIST ON CHART

## 2013-10-02 ENCOUNTER — Encounter (HOSPITAL_COMMUNITY)
Admission: RE | Admit: 2013-10-02 | Discharge: 2013-10-02 | Disposition: A | Discharge status: Home / Self Care | Payer: 59 | Source: Ambulatory Visit | Attending: Orthopedic Surgery | Admitting: Orthopedic Surgery

## 2013-10-02 ENCOUNTER — Ambulatory Visit (HOSPITAL_COMMUNITY)
Admission: RE | Admit: 2013-10-02 | Discharge: 2013-10-02 | Disposition: A | Discharge status: Home / Self Care | Payer: 59 | Source: Ambulatory Visit | Attending: Anesthesiology | Admitting: Anesthesiology

## 2013-10-02 ENCOUNTER — Encounter (HOSPITAL_COMMUNITY): Payer: Self-pay

## 2013-10-02 ENCOUNTER — Encounter (HOSPITAL_COMMUNITY): Payer: Self-pay | Admitting: Pharmacy Technician

## 2013-10-02 DIAGNOSIS — Z01818 Encounter for other preprocedural examination: Secondary | ICD-10-CM | POA: Diagnosis not present

## 2013-10-02 LAB — CBC
HCT: 41.9 % (ref 39.0–52.0)
Hemoglobin: 14.3 g/dL (ref 13.0–17.0)
MCH: 27.6 pg (ref 26.0–34.0)
MCHC: 34.1 g/dL (ref 30.0–36.0)
MCV: 80.9 fL (ref 78.0–100.0)
PLATELETS: 240 10*3/uL (ref 150–400)
RBC: 5.18 MIL/uL (ref 4.22–5.81)
RDW: 13.1 % (ref 11.5–15.5)
WBC: 7.2 10*3/uL (ref 4.0–10.5)

## 2013-10-02 LAB — BASIC METABOLIC PANEL
ANION GAP: 12 (ref 5–15)
BUN: 13 mg/dL (ref 6–23)
CO2: 28 mEq/L (ref 19–32)
Calcium: 9.7 mg/dL (ref 8.4–10.5)
Chloride: 101 mEq/L (ref 96–112)
Creatinine, Ser: 1.12 mg/dL (ref 0.50–1.35)
GFR calc Af Amer: >90 mL/min (ref >90)
GFR, EST NON AFRICAN AMERICAN: 82 mL/min — AB (ref >90)
Glucose, Bld: 101 mg/dL — ABNORMAL HIGH (ref 70–99)
POTASSIUM: 4.3 meq/L (ref 3.7–5.3)
SODIUM: 141 meq/L (ref 137–147)

## 2013-10-02 IMAGING — CR DG CHEST 2V
3 series · 3 of 3 positions shown · non-contrast
Comparison: None.

CLINICAL DATA: Aortic valve reconstruction, preoperative evaluation
for left knee surgery

EXAM:
CHEST - 2 VIEW

[w chest pa]
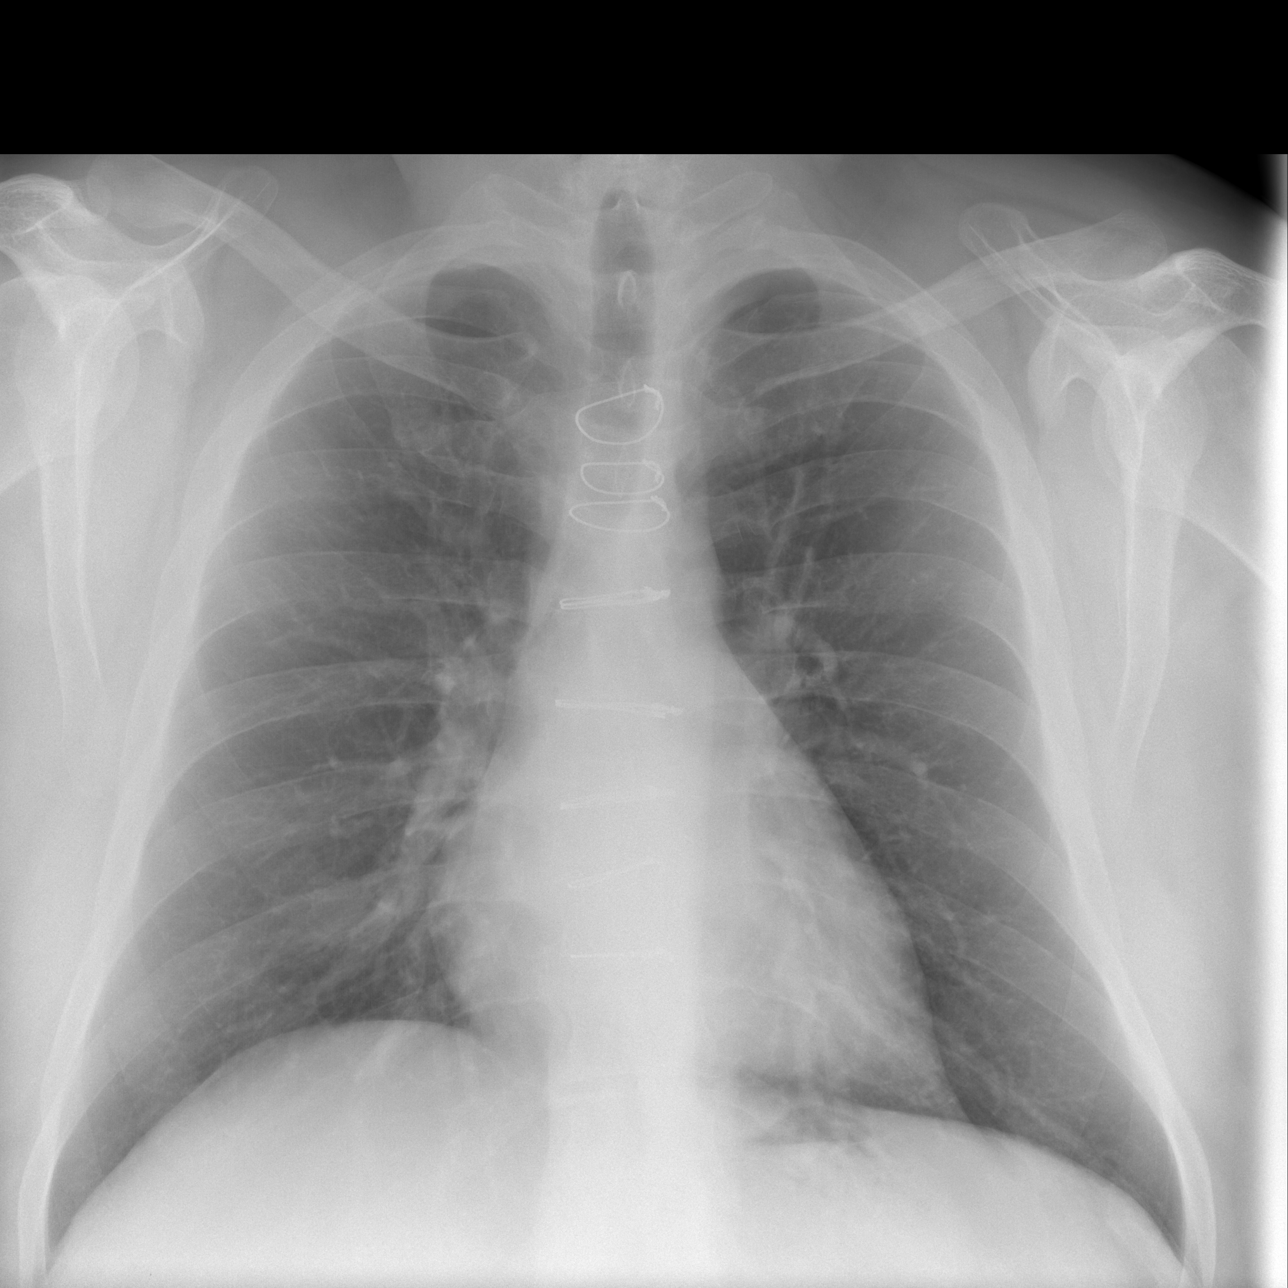

[w chest lat (1 of 2)]
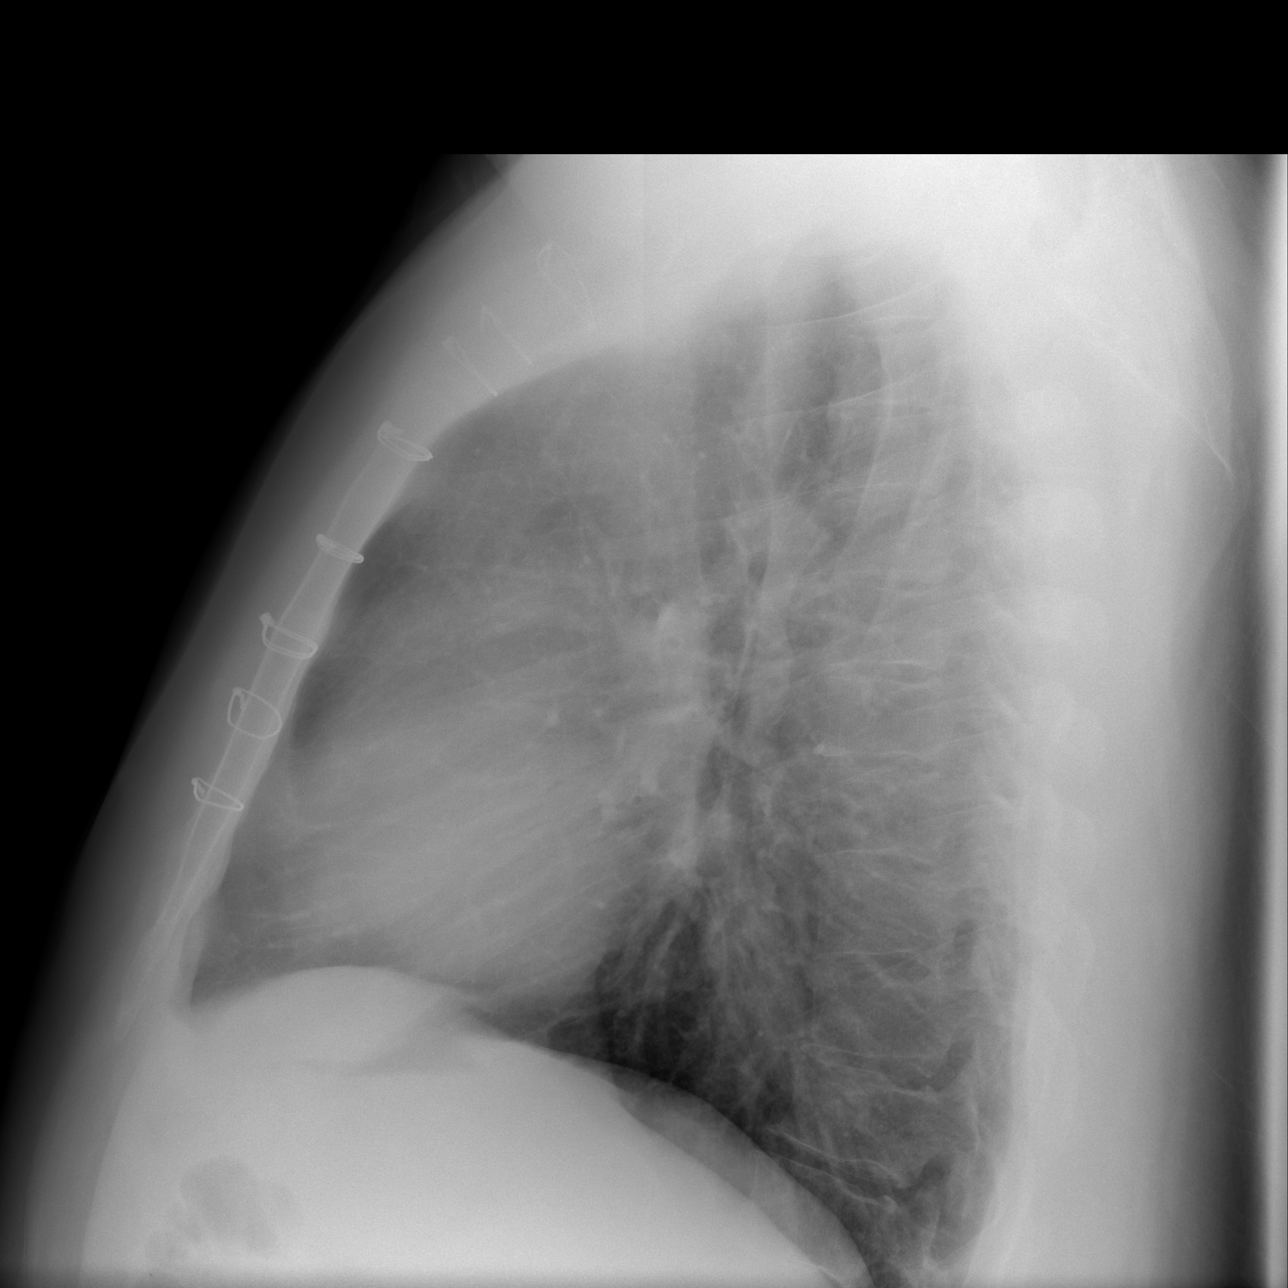

[w chest lat (2 of 2)]
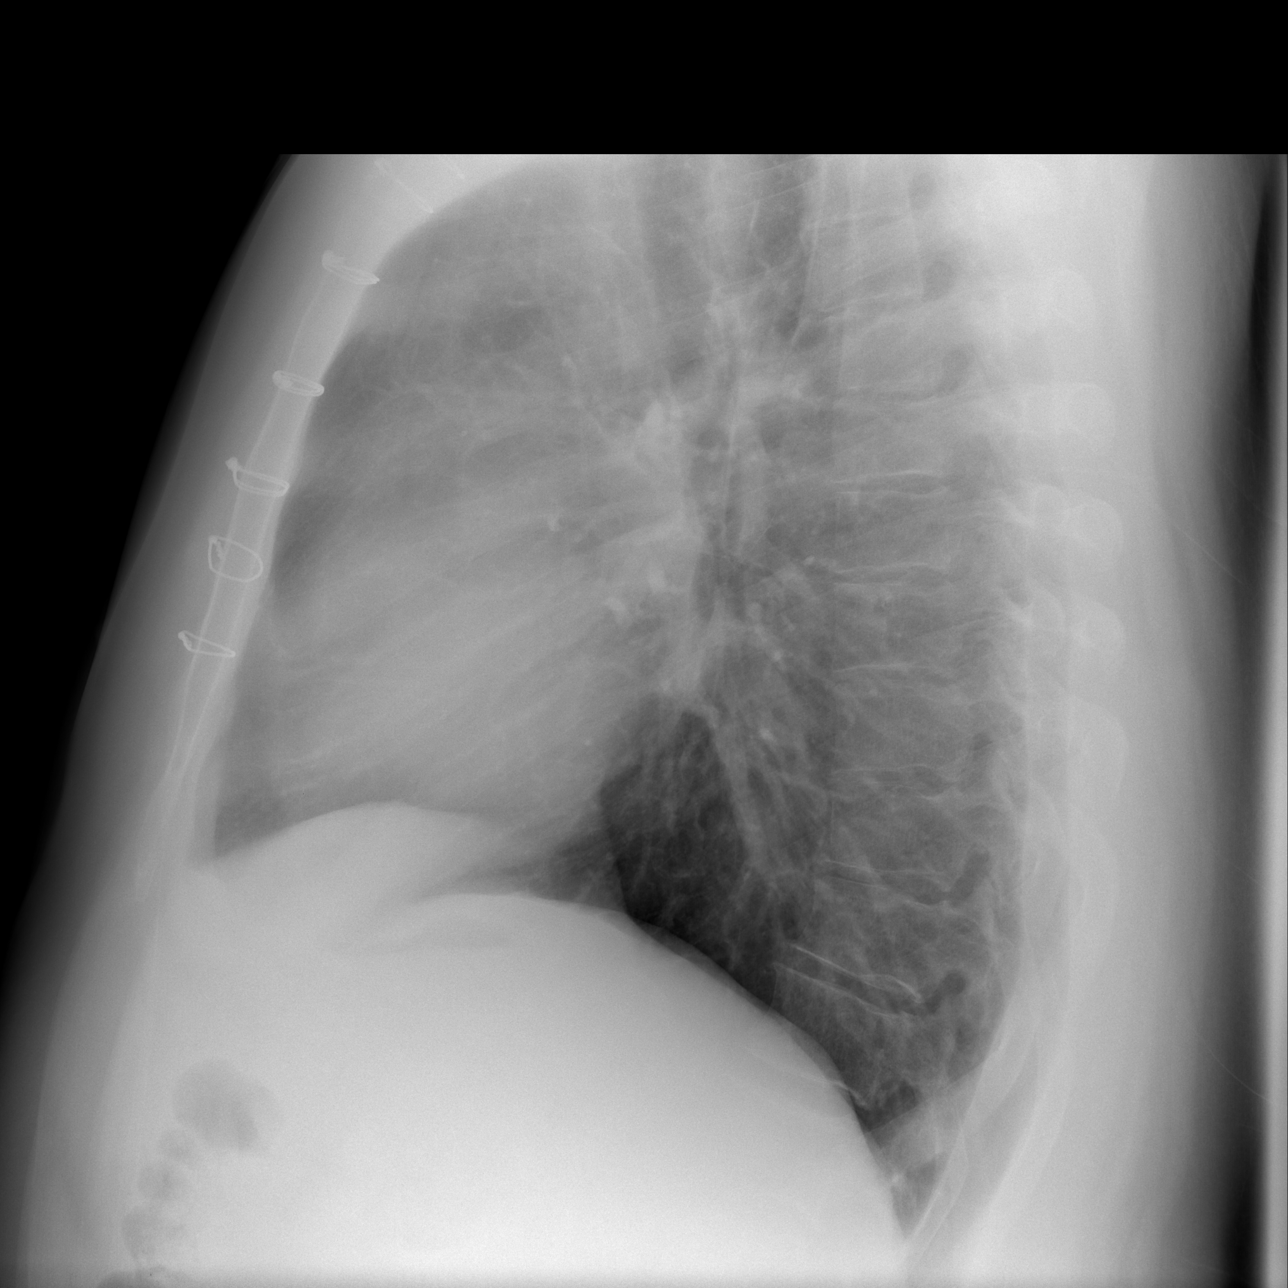

[3 of 3 positions shown; findings below may reference images not displayed]

FINDINGS: Prior median sternotomy noted. Normal heart size and vascularity.
Clear lungs. No focal pneumonia, collapse or consolidation. No
edema, effusion or pneumothorax. Trachea midline.
IMPRESSION: Postop changes.  No acute chest process

## 2013-10-02 NOTE — Patient Instructions (Addendum)
20     Your procedure is scheduled on:  Wednesday 10/07/13  Report to [facility] Main Entrance and follow signs to Short Stay  at 1245 PM.  Call this number if you have problems the night before or morning of surgery:  [phone]   Remember:          Do not eat food AFTER MIDNIGHT! MAY HAVE CLEAR LIQUIDS FROM MIDNIGHT UP UNTIL 0845 AM THEN NOTHING UNTIL AFTER SURGERY!  Take these medicines the morning of surgery with A SIP OF WATER: Norvasc, Metoprolol    Carl Bruce IS NOT RESPONSIBLE FOR ANY BELONGINGS OR VALUABLES BROUGHT TO HOSPITAL.  Marland Kitchen  Leave suitcase in the car. After surgery it may be brought to your room.  For patients admitted to the hospital, checkout time is 11:00 AM the day of              Discharge.    DO NOT WEAR JEWELRY,MAKE-UP,LOTIONS,POWDERS,PERFUMES,CONTACTS , DENTURES OR BRIDGEWORK ,AND DO NOT WEAR FALSE EYELASHES                                    Patients discharged the day of surgery will not be allowed to drive home.  If going home the same day of surgery, must have someone stay with you first 24 hrs.at home and arrange for someone to drive you home from the              Hospital.              YOUR DRIVER IS: Carl Bruce-spouse   Special Instructions:              Please read over the following fact sheets that you were given:             1. [place] PREPARING FOR SURGERY SHEET              2.INCENTIVE SPIROMETRY                                                        [place] - Preparing for Surgery Before surgery, you can play an important role.  Because skin is not sterile, your skin needs to be as free of germs as possible.  You can reduce the number of germs on your skin by washing with CHG (chlorahexidine gluconate) soap before surgery.  CHG is an antiseptic cleaner which kills germs and bonds with the skin to continue killing germs even after washing. Please DO NOT use if you have an allergy to CHG or antibacterial soaps.  If your skin becomes  reddened/irritated stop using the CHG and inform your nurse when you arrive at Short Stay. Do not shave (including legs and underarms) for at least 48 hours prior to the first CHG shower.  You may shave your face/neck. Please follow these instructions carefully:  1.  Shower with CHG Soap the night before surgery and the  morning of Surgery.  2.  If you choose to wash your hair, wash your hair first as usual with your  normal  shampoo.  3.  After you shampoo, rinse your hair and body thoroughly to remove the  shampoo.  4.  Use CHG as you would any other liquid soap.  You can apply chg directly  to the skin and wash                       Gently with a scrungie or clean washcloth.  5.  Apply the CHG Soap to your body ONLY FROM THE NECK DOWN.   Do not use on face/ open                           Wound or open sores. Avoid contact with eyes, ears mouth and genitals (private parts).                       Wash face,  Genitals (private parts) with your normal soap.             6.  Wash thoroughly, paying special attention to the area where your surgery  will be performed.  7.  Thoroughly rinse your body with warm water from the neck down.  8.  DO NOT shower/wash with your normal soap after using and rinsing off  the CHG Soap.                9.  Pat yourself dry with a clean towel.            10.  Wear clean pajamas.            11.  Place clean sheets on your bed the night of your first shower and do not  sleep with pets. Day of Surgery : Do not apply any lotions/deodorants the morning of surgery.  Please wear clean clothes to the hospital/surgery center.  FAILURE TO FOLLOW THESE INSTRUCTIONS MAY RESULT IN THE CANCELLATION OF YOUR SURGERY PATIENT SIGNATURE_________________________________  NURSE SIGNATURE__________________________________  ________________________________________________________________________   Carl Bruce  An incentive spirometer is a tool that  can help keep your lungs clear and active. This tool measures how well you are filling your lungs with each breath. Taking long deep breaths may help reverse or decrease the chance of developing breathing (pulmonary) problems (especially infection) following:  A long period of time when you are unable to move or be active. BEFORE THE PROCEDURE   If the spirometer includes an indicator to show your best effort, your nurse or respiratory therapist will set it to a desired goal.  If possible, sit up straight or lean slightly forward. Try not to slouch.  Hold the incentive spirometer in an upright position. INSTRUCTIONS FOR USE  1. Sit on the edge of your bed if possible, or sit up as far as you can in bed or on a chair. 2. Hold the incentive spirometer in an upright position. 3. Breathe out normally. 4. Place the mouthpiece in your mouth and seal your lips tightly around it. 5. Breathe in slowly and as deeply as possible, raising the piston or the ball toward the top of the column. 6. Hold your breath for 3-5 seconds or for as long as possible. Allow the piston or ball to fall to the bottom of the column. 7. Remove the mouthpiece from your mouth and breathe out normally. 8. Rest for a few seconds and repeat Steps 1 through 7 at least 10 times every 1-2 hours when you are awake. Take your time and take a few normal breaths between deep breaths. 9. The spirometer may include an indicator to show  your best effort. Use the indicator as a goal to work toward during each repetition. 10. After each set of 10 deep breaths, practice coughing to be sure your lungs are clear. If you have an incision (the cut made at the time of surgery), support your incision when coughing by placing a pillow or rolled up towels firmly against it. Once you are able to get out of bed, walk around indoors and cough well. You may stop using the incentive spirometer when instructed by your caregiver.  RISKS AND  COMPLICATIONS  Take your time so you do not get dizzy or light-headed.  If you are in pain, you may need to take or ask for pain medication before doing incentive spirometry. It is harder to take a deep breath if you are having pain. AFTER USE  Rest and breathe slowly and easily.  It can be helpful to keep track of a log of your progress. Your caregiver can provide you with a simple table to help with this. If you are using the spirometer at home, follow these instructions: [place] IF:   You are having difficultly using the spirometer.  You have trouble using the spirometer as often as instructed.  Your pain medication is not giving enough relief while using the spirometer.  You develop fever of 100.5 F (38.1 C) or higher. SEEK IMMEDIATE MEDICAL CARE IF:   You cough up bloody sputum that had not been present before.  You develop fever of 102 F (38.9 C) or greater.  You develop worsening pain at or near the incision site. MAKE SURE YOU:   Understand these instructions.  Will watch your condition.  Will get help right away if you are not doing well or get worse. Document Released: 06/04/2006 Document Revised: 04/16/2011 Document Reviewed: 08/05/2006 [facility] Patient Information 2014 [place], [place].   ________________________________________________________________________

## 2013-10-02 NOTE — Progress Notes (Signed)
10/02/13 0956  OBSTRUCTIVE SLEEP APNEA  Have you ever been diagnosed with sleep apnea through a sleep study? No  Do you snore loudly (loud enough to be heard through closed doors)?  1  Do you often feel tired, fatigued, or sleepy during the daytime? 1  Has anyone observed you stop breathing during your sleep? 0  Do you have, or are you being treated for high blood pressure? 1  BMI more than 35 kg/m2? 0  Age over 37 years old? 0  Neck circumference greater than 40 cm/16 inches? 1  Gender: 1  Obstructive Sleep Apnea Score 5  Score 4 or greater  Results sent to PCP

## 2013-10-05 ENCOUNTER — Other Ambulatory Visit: Payer: Self-pay | Admitting: Family Medicine

## 2013-10-06 MED ORDER — SODIUM CHLORIDE 0.9 % IV SOLN
1500.0000 mg | INTRAVENOUS | Status: AC
  Administered 2013-10-07: 1500 mg via INTRAVENOUS
  Filled 2013-10-06: qty 1500

## 2013-10-07 ENCOUNTER — Ambulatory Visit (HOSPITAL_COMMUNITY): Payer: 59 | Admitting: Anesthesiology

## 2013-10-07 ENCOUNTER — Ambulatory Visit (HOSPITAL_COMMUNITY)
Admission: RE | Admit: 2013-10-07 | Discharge: 2013-10-07 | Disposition: A | Discharge status: Home / Self Care | Payer: 59 | Source: Ambulatory Visit | Attending: Orthopedic Surgery | Admitting: Orthopedic Surgery

## 2013-10-07 ENCOUNTER — Encounter (HOSPITAL_COMMUNITY)
Admission: RE | Disposition: A | Discharge status: Home / Self Care | Payer: Self-pay | Source: Ambulatory Visit | Attending: Orthopedic Surgery

## 2013-10-07 ENCOUNTER — Encounter (HOSPITAL_COMMUNITY): Payer: 59 | Admitting: Anesthesiology

## 2013-10-07 ENCOUNTER — Encounter (HOSPITAL_COMMUNITY): Payer: Self-pay

## 2013-10-07 DIAGNOSIS — M23329 Other meniscus derangements, posterior horn of medial meniscus, unspecified knee: Secondary | ICD-10-CM | POA: Insufficient documentation

## 2013-10-07 DIAGNOSIS — I10 Essential (primary) hypertension: Secondary | ICD-10-CM | POA: Insufficient documentation

## 2013-10-07 DIAGNOSIS — Z87891 Personal history of nicotine dependence: Secondary | ICD-10-CM | POA: Diagnosis not present

## 2013-10-07 DIAGNOSIS — M25569 Pain in unspecified knee: Secondary | ICD-10-CM | POA: Diagnosis present

## 2013-10-07 DIAGNOSIS — Z79899 Other long term (current) drug therapy: Secondary | ICD-10-CM | POA: Insufficient documentation

## 2013-10-07 DIAGNOSIS — S83249A Other tear of medial meniscus, current injury, unspecified knee, initial encounter: Secondary | ICD-10-CM | POA: Diagnosis present

## 2013-10-07 DIAGNOSIS — S83242D Other tear of medial meniscus, current injury, left knee, subsequent encounter: Secondary | ICD-10-CM

## 2013-10-07 HISTORY — PX: KNEE ARTHROSCOPY: SHX127

## 2013-10-07 SURGERY — ARTHROSCOPY, KNEE
Anesthesia: General | Site: Knee | Laterality: Left

## 2013-10-07 MED ORDER — ONDANSETRON HCL 4 MG/2ML IJ SOLN
INTRAMUSCULAR | Status: DC | PRN
  Administered 2013-10-07: 4 mg via INTRAVENOUS

## 2013-10-07 MED ORDER — DEXAMETHASONE SODIUM PHOSPHATE 10 MG/ML IJ SOLN
INTRAMUSCULAR | Status: AC
  Filled 2013-10-07: qty 1

## 2013-10-07 MED ORDER — HYDROCODONE-ACETAMINOPHEN 5-325 MG PO TABS
1.0000 | ORAL_TABLET | ORAL | Status: DC | PRN
Start: 2013-10-07 — End: 2014-06-03

## 2013-10-07 MED ORDER — DIPHENHYDRAMINE HCL 50 MG/ML IJ SOLN
12.5000 mg | Freq: Once | INTRAMUSCULAR | Status: AC
  Administered 2013-10-07: 12.5 mg via INTRAVENOUS

## 2013-10-07 MED ORDER — HYDROMORPHONE HCL PF 1 MG/ML IJ SOLN
INTRAMUSCULAR | Status: AC
  Filled 2013-10-07: qty 1

## 2013-10-07 MED ORDER — PROPOFOL 10 MG/ML IV BOLUS
INTRAVENOUS | Status: AC
  Filled 2013-10-07: qty 20

## 2013-10-07 MED ORDER — SODIUM CHLORIDE 0.9 % IV SOLN: INTRAVENOUS | Status: DC

## 2013-10-07 MED ORDER — PROMETHAZINE HCL 25 MG/ML IJ SOLN: 6.2500 mg | INTRAMUSCULAR | Status: DC | PRN

## 2013-10-07 MED ORDER — VANCOMYCIN HCL IN DEXTROSE 1-5 GM/200ML-% IV SOLN
INTRAVENOUS | Status: AC
  Filled 2013-10-07: qty 200

## 2013-10-07 MED ORDER — MEPERIDINE HCL 50 MG/ML IJ SOLN: 6.2500 mg | INTRAMUSCULAR | Status: DC | PRN

## 2013-10-07 MED ORDER — HYDROMORPHONE HCL PF 1 MG/ML IJ SOLN
0.2500 mg | INTRAMUSCULAR | Status: DC | PRN
  Administered 2013-10-07 (×2): 0.5 mg via INTRAVENOUS

## 2013-10-07 MED ORDER — DIPHENHYDRAMINE HCL 50 MG/ML IJ SOLN
INTRAMUSCULAR | Status: AC
  Filled 2013-10-07: qty 1

## 2013-10-07 MED ORDER — FENTANYL CITRATE 0.05 MG/ML IJ SOLN
INTRAMUSCULAR | Status: DC | PRN
  Administered 2013-10-07 (×2): 50 ug via INTRAVENOUS

## 2013-10-07 MED ORDER — ACETAMINOPHEN 10 MG/ML IV SOLN
1000.0000 mg | Freq: Once | INTRAVENOUS | Status: AC
  Administered 2013-10-07: 1000 mg via INTRAVENOUS
  Filled 2013-10-07: qty 100

## 2013-10-07 MED ORDER — LACTATED RINGERS IV SOLN
INTRAVENOUS | Status: DC
  Administered 2013-10-07: 17:00:00 via INTRAVENOUS
  Administered 2013-10-07: 1000 mL via INTRAVENOUS

## 2013-10-07 MED ORDER — MIDAZOLAM HCL 2 MG/2ML IJ SOLN
INTRAMUSCULAR | Status: AC
  Filled 2013-10-07: qty 2

## 2013-10-07 MED ORDER — LACTATED RINGERS IR SOLN
Status: DC | PRN
Start: 2013-10-07 — End: 2013-10-07
  Administered 2013-10-07: 9000 mL

## 2013-10-07 MED ORDER — OXYCODONE HCL 5 MG PO TABS: 5.0000 mg | ORAL_TABLET | Freq: Once | ORAL | Status: DC | PRN

## 2013-10-07 MED ORDER — METHOCARBAMOL 500 MG PO TABS: 500.0000 mg | ORAL_TABLET | Freq: Four times a day (QID) | ORAL | Status: DC

## 2013-10-07 MED ORDER — BUPIVACAINE-EPINEPHRINE (PF) 0.25% -1:200000 IJ SOLN
INTRAMUSCULAR | Status: AC
  Filled 2013-10-07: qty 30

## 2013-10-07 MED ORDER — BUPIVACAINE-EPINEPHRINE 0.25% -1:200000 IJ SOLN
INTRAMUSCULAR | Status: DC | PRN
  Administered 2013-10-07: 20 mL

## 2013-10-07 MED ORDER — FENTANYL CITRATE 0.05 MG/ML IJ SOLN
INTRAMUSCULAR | Status: AC
  Filled 2013-10-07: qty 5

## 2013-10-07 MED ORDER — DEXAMETHASONE SODIUM PHOSPHATE 10 MG/ML IJ SOLN
10.0000 mg | Freq: Once | INTRAMUSCULAR | Status: AC
  Administered 2013-10-07: 10 mg via INTRAVENOUS

## 2013-10-07 MED ORDER — ONDANSETRON HCL 4 MG/2ML IJ SOLN
INTRAMUSCULAR | Status: AC
  Filled 2013-10-07: qty 2

## 2013-10-07 MED ORDER — PROPOFOL 10 MG/ML IV BOLUS
INTRAVENOUS | Status: DC | PRN
  Administered 2013-10-07: 300 mg via INTRAVENOUS

## 2013-10-07 MED ORDER — LIDOCAINE HCL (CARDIAC) 20 MG/ML IV SOLN
INTRAVENOUS | Status: DC | PRN
  Administered 2013-10-07: 100 mg via INTRAVENOUS

## 2013-10-07 MED ORDER — MIDAZOLAM HCL 5 MG/5ML IJ SOLN
INTRAMUSCULAR | Status: DC | PRN
  Administered 2013-10-07: 2 mg via INTRAVENOUS

## 2013-10-07 MED ORDER — OXYCODONE HCL 5 MG/5ML PO SOLN
5.0000 mg | Freq: Once | ORAL | Status: DC | PRN
  Filled 2013-10-07: qty 5

## 2013-10-07 SURGICAL SUPPLY — 26 items
BLADE 4.2CUDA (BLADE) ×3 IMPLANT
CLOTH BEACON ORANGE TIMEOUT ST (SAFETY) ×3 IMPLANT
COUNTER NEEDLE 20 DBL MAG RED (NEEDLE) ×3 IMPLANT
CUFF TOURN SGL QUICK 34 (TOURNIQUET CUFF) ×2
CUFF TRNQT CYL 34X4X40X1 (TOURNIQUET CUFF) ×1 IMPLANT
DRAPE LG THREE QUARTER DISP (DRAPES) ×3 IMPLANT
DRAPE U-SHAPE 47X51 STRL (DRAPES) ×3 IMPLANT
DRSG EMULSION OIL 3X3 NADH (GAUZE/BANDAGES/DRESSINGS) ×3 IMPLANT
DURAPREP 26ML APPLICATOR (WOUND CARE) ×3 IMPLANT
GLOVE BIO SURGEON STRL SZ8 (GLOVE) ×3 IMPLANT
GLOVE BIOGEL PI IND STRL 8 (GLOVE) ×2 IMPLANT
GLOVE BIOGEL PI INDICATOR 8 (GLOVE) ×4
GLOVE ECLIPSE 7.5 STRL STRAW (GLOVE) ×3 IMPLANT
GOWN SPEC L3 XXLG W/TWL (GOWN DISPOSABLE) ×3 IMPLANT
GOWN STRL REUS W/TWL LRG LVL3 (GOWN DISPOSABLE) ×3 IMPLANT
MANIFOLD NEPTUNE II (INSTRUMENTS) ×3 IMPLANT
PACK ARTHROSCOPY WL (CUSTOM PROCEDURE TRAY) ×3 IMPLANT
PACK ICE MAXI GEL EZY WRAP (MISCELLANEOUS) ×9 IMPLANT
PADDING CAST COTTON 6X4 STRL (CAST SUPPLIES) ×6 IMPLANT
POSITIONER SURGICAL ARM (MISCELLANEOUS) ×3 IMPLANT
SET ARTHROSCOPY TUBING (MISCELLANEOUS) ×2
SET ARTHROSCOPY TUBING LN (MISCELLANEOUS) ×1 IMPLANT
SUT ETHILON 4 0 PS 2 18 (SUTURE) ×3 IMPLANT
TOWEL OR 17X26 10 PK STRL BLUE (TOWEL DISPOSABLE) ×3 IMPLANT
WAND 90 DEG TURBOVAC W/CORD (SURGICAL WAND) ×3 IMPLANT
WRAP KNEE MAXI GEL POST OP (GAUZE/BANDAGES/DRESSINGS) ×3 IMPLANT

## 2013-10-07 NOTE — Op Note (Signed)
Preoperative diagnosis-  Left knee medial meniscal tear  Postoperative diagnosis Left- knee medial meniscal tear plus    Procedure- Left knee arthroscopy with medial meniscal debridement    Surgeon- Gus Rankin. Jennah Satchell, MD  Anesthesia-General  EBL-  Minimal  Complications- None  Condition- PACU - hemodynamically stable.  Brief clinical note- -Carl Bruce is a 38 y.o.  male with a several month history of left knee pain and mechanical symptoms. Exam and history suggested medial meniscal tear confirmed by MRI. The patient presents now for arthroscopy and debridement  Procedure in detail -       After successful administration of General anesthetic, a tourmiquet is placed high on the Left  thigh and the Left lower extremity is prepped and draped in the usual sterile fashion. Time out is performed by the surgical team. Standard superomedial and inferolateral portal sites are marked and incisions made with an 11 blade. The inflow cannula is passed through the superomedial portal and camera through the inferolateral portal and inflow is initiated. Arthroscopic visualization proceeds.      The undersurface of the patella and trochlea are visualized and they appear normal. The medial and lateral gutters are visualized and there are no loose bodies. Flexion and valgus force is applied to the knee and the medial compartment is entered. A spinal needle is passed into the joint through the site marked for the inferomedial portal. A small incision is made and the dilator passed into the joint. The findings for the medial compartment are unstable tear body and posterior horn medial meniscus with fragment flipped under body . The tear is debrided to a stable base with baskets and a shaver and sealed off with the Arthrocare. It is probed and found to be stable.    The intercondylar notch is visualized and the ACL appears normal. The lateral compartment is entered and the findings are normal .      The joint  is again inspected and there are no other tears, defects or loose bodies identified. The arthroscopic equipment is then removed from the inferior portals which are closed with interrupted 4-0 nylon. 20 ml of .25% Marcaine with epinephrine are injected through the inflow cannula and the cannula is then removed and the portal closed with nylon. The incisions are cleaned and dried and a bulky sterile dressing is applied. The patient is then awakened and transported to recovery in stable condition.   10/07/2013, 3:30 PM

## 2013-10-07 NOTE — Transfer of Care (Signed)
Immediate Anesthesia Transfer of Care Note  Patient: Carl Bruce  Procedure(s) Performed: Procedure(s): LEFT ARTHROSCOPY KNEE WITH MEDIAL MENISCUS DEBRIDEMENT (Left)  Patient Location: PACU  Anesthesia Type:General  Level of Consciousness: awake, alert , oriented and patient cooperative  Airway & Oxygen Therapy: Patient Spontanous Breathing and Patient connected to face mask oxygen  Post-op Assessment: Report given to PACU RN, Post -op Vital signs reviewed and stable and Patient moving all extremities  Post vital signs: Reviewed and stable  Complications: No apparent anesthesia complications

## 2013-10-07 NOTE — Anesthesia Preprocedure Evaluation (Addendum)
Anesthesia Evaluation  Patient identified by MRN, date of birth, ID band Patient awake    Reviewed: Allergy & Precautions, H&P , NPO status , Patient's Chart, lab work & pertinent test results  Airway Mallampati: II TM Distance: >3 FB Neck ROM: Full    Dental no notable dental hx.    Pulmonary neg pulmonary ROS, former smoker,  breath sounds clear to auscultation  Pulmonary exam normal       Cardiovascular hypertension, Pt. on medications negative cardio ROS  + Valvular Problems/Murmurs Rhythm:Regular Rate:Normal     Neuro/Psych negative neurological ROS  negative psych ROS   GI/Hepatic negative GI ROS, Neg liver ROS,   Endo/Other  negative endocrine ROS  Renal/GU negative Renal ROS     Musculoskeletal negative musculoskeletal ROS (+)   Abdominal   Peds  Hematology negative hematology ROS (+)   Anesthesia Other Findings   Reproductive/Obstetrics negative OB ROS                         Anesthesia Physical Anesthesia Plan  ASA: II  Anesthesia Plan: General   Post-op Pain Management:    Induction: Intravenous  Airway Management Planned: LMA  Additional Equipment:   Intra-op Plan:   Post-operative Plan: Extubation in OR  Informed Consent: I have reviewed the patients History and Physical, chart, labs and discussed the procedure including the risks, benefits and alternatives for the proposed anesthesia with the patient or authorized representative who has indicated his/her understanding and acceptance.   Dental advisory given  Plan Discussed with: CRNA  Anesthesia Plan Comments:         Anesthesia Quick Evaluation

## 2013-10-07 NOTE — Discharge Instructions (Signed)
Arthroscopic Procedure, Knee °An arthroscopic procedure can find what is wrong with your knee. °PROCEDURE °Arthroscopy is a surgical technique that allows your orthopedic surgeon to diagnose and treat your knee injury with accuracy. They will look into your knee through a small instrument. This is almost like a small (pencil sized) telescope. Because arthroscopy affects your knee less than open knee surgery, you can anticipate a more rapid recovery. Taking an active role by following your caregiver's instructions will help with rapid and complete recovery. Use crutches, rest, elevation, ice, and knee exercises as instructed. The length of recovery depends on various factors including type of injury, age, physical condition, medical conditions, and your rehabilitation. °Your knee is the joint between the large bones (femur and tibia) in your leg. Cartilage covers these bone ends which are smooth and slippery and allow your knee to bend and move smoothly. Two menisci, thick, semi-lunar shaped pads of cartilage which form a rim inside the joint, help absorb shock and stabilize your knee. Ligaments bind the bones together and support your knee joint. Muscles move the joint, help support your knee, and take stress off the joint itself. Because of this all programs and physical therapy to rehabilitate an injured or repaired knee require rebuilding and strengthening your muscles. °AFTER THE PROCEDURE °· After the procedure, you will be moved to a recovery area until most of the effects of the medication have worn off. Your caregiver will discuss the test results with you.  °· Only take over-the-counter or prescription medicines for pain, discomfort, or fever as directed by your caregiver.  °SEEK MEDICAL CARE IF:  °· You have increased bleeding from your wounds.  °· You see redness, swelling, or have increasing pain in your wounds.  °· You have pus coming from your wound.  °· You have an oral temperature above 102° F (38.9°  C).  °· You notice a bad smell coming from the wound or dressing.  °· You have severe pain with any motion of your knee.  °SEEK IMMEDIATE MEDICAL CARE IF:  °· You develop a rash.  °· You have difficulty breathing.  °· You have any allergic problems.  °FURTHER INSTRUCTIONS: °· You may start showering two days after being discharged home but do not submerge the incisions under water.  °· Change dressing 48 hours after the procedure and then cover the small incisions with band aids until your follow up visit. °· Avoid periods of inactivity such as sitting longer than an hour when not asleep. This helps prevent blood clots.  °· You may put full weight on your legs and walk as much as is comfortable.  °· Do not drive while taking narcotics.  °Wear the elastic stockings for three weeks following surgery during the day but you may remove then at night. °· Make sure you keep all of your appointments after your operation with all of your doctors and caregivers. You should call the office at [phone] and make an appointment for approximately one week after the date of your surgery. °· Please pick up a stool softener and laxative for home use as long as you are requiring pain medications. °· Continue to use ice on the knee for pain and swelling from surgery. You may notice swelling that will progress down to the foot and ankle.  This is normal after surgery.  Elevate the leg when you are not up walking on it.   °RANGE OF MOTION AND STRENGTHENING EXERCISES  °Rehabilitation of the knee is   important following a knee injury or an operation. After just a few days of immobilization, the muscles of the thigh which control the knee become weakened and shrink (atrophy). Knee exercises are designed to build up the tone and strength of the thigh muscles and to improve knee motion. Often times heat used for twenty to thirty minutes before working out will loosen up your tissues and help with improving the range of motion but do not  use heat for the first two weeks following surgery. These exercises can be done on a training (exercise) mat, on the floor, on a table or on a bed. Use what ever works the best and is most comfortable for you Knee exercises include: ° ° ° ° ° ° °QUAD STRENGTHENING EXERCISES °Strengthening Quadriceps Sets ° °Tighten muscles on top of thigh by pushing knees down into floor or table. °Hold for 20 seconds. Repeat 10 times. °Do 2 sessions per day. ° ° ° °Strengthening Terminal Knee Extension ° °With knee bent over bolster, straighten knee by tightening muscle on top of thigh. Be sure to keep bottom of knee on bolster. °Hold for 20 seconds. Repeat 10 times. °Do 2 sessions per day. ° ° °Straight Leg with Bent Knee ° °Lie on back with opposite leg bent. Keep involved knee slightly bent at knee and raise leg 4-6". Hold for 10 seconds. °Repeat 20 times per set. °Do 2 sets per session. °Do 2 sessions per day. ° °

## 2013-10-07 NOTE — Anesthesia Postprocedure Evaluation (Signed)
Anesthesia Post Note  Patient: Carl Bruce  Procedure(s) Performed: Procedure(s) (LRB): LEFT ARTHROSCOPY KNEE WITH MEDIAL MENISCUS DEBRIDEMENT (Left)  Anesthesia type: General  Patient location: PACU  Post pain: Pain level controlled  Post assessment: Post-op Vital signs reviewed  Last Vitals: BP 106/61  Pulse 58  Temp(Src) 36.3 C (Oral)  Resp 16  Ht 7' (2.134 m)  Wt 312 lb (141.522 kg)  BMI 31.08 kg/m2  SpO2 97%  Post vital signs: Reviewed  Level of consciousness: sedated  Complications: No apparent anesthesia complications

## 2013-10-07 NOTE — Progress Notes (Signed)
Itching stopped; face less flushed
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
## 2013-10-07 NOTE — Interval H&P Note (Signed)
History and Physical Interval Note:  10/07/2013 2:33 PM  Carl Bruce  has presented today for surgery, with the diagnosis of LEFT KNEE MEDIAL MENISCUS TEAR  The various methods of treatment have been discussed with the patient and family. After consideration of risks, benefits and other options for treatment, the patient has consented to  Procedure(s): LEFT ARTHROSCOPY KNEE WITH DEBRIDEMENT (Left) as a surgical intervention .  The patient's history has been reviewed, patient examined, no change in status, stable for surgery.  I have reviewed the patient's chart and labs.  Questions were answered to the patient's satisfaction.     Loanne Drilling

## 2013-10-07 NOTE — Progress Notes (Signed)
Benadryl 12.5mg  IVP GIVEN FOR HEAD ITCHING- NO WHELPS NOTED- FACE FLUSHED

## 2013-10-08 ENCOUNTER — Encounter (HOSPITAL_COMMUNITY): Payer: Self-pay | Admitting: Orthopedic Surgery

## 2013-10-15 ENCOUNTER — Telehealth: Payer: Self-pay | Admitting: *Deleted

## 2013-10-15 NOTE — Telephone Encounter (Signed)
No we are no longer allowed to do that due to insurance

## 2013-10-15 NOTE — Telephone Encounter (Signed)
Cardiologist increased Crestor dose to  qhs instead of . Can a new rx be sent in for  qhs so the pills can be broke in half due to cost? Please advise

## 2013-10-16 ENCOUNTER — Other Ambulatory Visit: Payer: Self-pay | Admitting: Family Medicine

## 2013-10-16 MED ORDER — ROSUVASTATIN CALCIUM 20 MG PO TABS: 20.0000 mg | ORAL_TABLET | Freq: Every day | ORAL | Status: DC

## 2013-10-16 NOTE — Telephone Encounter (Signed)
Patient aware.

## 2013-10-30 ENCOUNTER — Other Ambulatory Visit: Payer: Self-pay | Admitting: *Deleted

## 2013-10-30 MED ORDER — AMLODIPINE BESYLATE 5 MG PO TABS: 5.0000 mg | ORAL_TABLET | Freq: Every morning | ORAL | Status: DC

## 2013-10-30 MED ORDER — ROSUVASTATIN CALCIUM 20 MG PO TABS: 20.0000 mg | ORAL_TABLET | Freq: Every day | ORAL | Status: DC

## 2013-10-30 MED ORDER — METOPROLOL TARTRATE 100 MG PO TABS: 100.0000 mg | ORAL_TABLET | Freq: Two times a day (BID) | ORAL | Status: DC

## 2013-10-30 MED ORDER — CITALOPRAM HYDROBROMIDE 20 MG PO TABS: 20.0000 mg | ORAL_TABLET | Freq: Every day | ORAL | Status: DC

## 2013-12-28 ENCOUNTER — Other Ambulatory Visit: Payer: Self-pay | Admitting: *Deleted

## 2013-12-28 DIAGNOSIS — M25511 Pain in right shoulder: Secondary | ICD-10-CM

## 2014-02-24 ENCOUNTER — Other Ambulatory Visit: Payer: Self-pay | Admitting: Nurse Practitioner

## 2014-02-25 ENCOUNTER — Other Ambulatory Visit: Payer: Self-pay | Admitting: Nurse Practitioner

## 2014-03-15 ENCOUNTER — Other Ambulatory Visit: Payer: Self-pay | Admitting: *Deleted

## 2014-03-15 MED ORDER — ROSUVASTATIN CALCIUM 20 MG PO TABS
20.0000 mg | ORAL_TABLET | Freq: Every day | ORAL | Status: DC
Start: 2014-03-15 — End: 2014-06-05

## 2014-03-15 MED ORDER — CITALOPRAM HYDROBROMIDE 20 MG PO TABS: 20.0000 mg | ORAL_TABLET | Freq: Two times a day (BID) | ORAL | Status: DC

## 2014-03-15 MED ORDER — AMLODIPINE BESYLATE 5 MG PO TABS: 5.0000 mg | ORAL_TABLET | Freq: Every morning | ORAL | Status: DC

## 2014-03-15 MED ORDER — METOPROLOL TARTRATE 100 MG PO TABS: ORAL_TABLET | ORAL | Status: DC

## 2014-03-16 ENCOUNTER — Other Ambulatory Visit: Payer: Self-pay | Admitting: *Deleted

## 2014-03-16 ENCOUNTER — Other Ambulatory Visit: Payer: Self-pay | Admitting: Nurse Practitioner

## 2014-03-16 MED ORDER — AZITHROMYCIN 250 MG PO TABS: ORAL_TABLET | ORAL | Status: DC

## 2014-06-03 ENCOUNTER — Ambulatory Visit (INDEPENDENT_AMBULATORY_CARE_PROVIDER_SITE_OTHER): Payer: 59 | Admitting: Family Medicine

## 2014-06-03 ENCOUNTER — Encounter: Payer: Self-pay | Admitting: Family Medicine

## 2014-06-03 VITALS — BP 118/64 | HR 51 | Temp 97.7°F | Resp 18 | Ht >= 80 in | Wt 321.0 lb

## 2014-06-03 DIAGNOSIS — Q8740 Marfan's syndrome, unspecified: Secondary | ICD-10-CM

## 2014-06-03 DIAGNOSIS — F329 Major depressive disorder, single episode, unspecified: Secondary | ICD-10-CM

## 2014-06-03 DIAGNOSIS — F418 Other specified anxiety disorders: Secondary | ICD-10-CM

## 2014-06-03 DIAGNOSIS — I10 Essential (primary) hypertension: Secondary | ICD-10-CM

## 2014-06-03 DIAGNOSIS — F419 Anxiety disorder, unspecified: Secondary | ICD-10-CM

## 2014-06-03 DIAGNOSIS — E785 Hyperlipidemia, unspecified: Secondary | ICD-10-CM | POA: Diagnosis not present

## 2014-06-03 LAB — LIPID PANEL
CHOL/HDL RATIO: 5
Cholesterol: 188 mg/dL (ref 0–200)
HDL: 35.7 mg/dL — ABNORMAL LOW (ref >39.00)
NonHDL: 152.3
Triglycerides: 202 mg/dL — ABNORMAL HIGH (ref 0.0–149.0)
VLDL: 40.4 mg/dL — AB (ref 0.0–40.0)

## 2014-06-03 LAB — COMPREHENSIVE METABOLIC PANEL
ALT: 38 U/L (ref 0–53)
AST: 22 U/L (ref 0–37)
Albumin: 4.5 g/dL (ref 3.5–5.2)
Alkaline Phosphatase: 84 U/L (ref 39–117)
BILIRUBIN TOTAL: 0.5 mg/dL (ref 0.2–1.2)
BUN: 12 mg/dL (ref 6–23)
CALCIUM: 9.7 mg/dL (ref 8.4–10.5)
CO2: 32 meq/L (ref 19–32)
CREATININE: 1.09 mg/dL (ref 0.40–1.50)
Chloride: 101 mEq/L (ref 96–112)
GFR: 80.39 mL/min (ref >60.00)
Glucose, Bld: 108 mg/dL — ABNORMAL HIGH (ref 70–99)
Potassium: 4.6 mEq/L (ref 3.5–5.1)
Sodium: 139 mEq/L (ref 135–145)
Total Protein: 6.7 g/dL (ref 6.0–8.3)

## 2014-06-03 LAB — LDL CHOLESTEROL, DIRECT: LDL DIRECT: 100 mg/dL

## 2014-06-03 MED ORDER — METOPROLOL TARTRATE 100 MG PO TABS: ORAL_TABLET | ORAL | Status: DC

## 2014-06-03 MED ORDER — CITALOPRAM HYDROBROMIDE 20 MG PO TABS: 20.0000 mg | ORAL_TABLET | Freq: Two times a day (BID) | ORAL | Status: DC

## 2014-06-03 MED ORDER — AMLODIPINE BESYLATE 5 MG PO TABS: 5.0000 mg | ORAL_TABLET | Freq: Every morning | ORAL | Status: DC
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
## 2014-06-03 NOTE — Progress Notes (Signed)
Pre visit review using our clinic review tool, if applicable. No additional management support is needed unless otherwise documented below in the visit note. 

## 2014-06-04 ENCOUNTER — Other Ambulatory Visit: Payer: Self-pay | Admitting: Family Medicine

## 2014-06-04 ENCOUNTER — Other Ambulatory Visit (INDEPENDENT_AMBULATORY_CARE_PROVIDER_SITE_OTHER): Payer: 59

## 2014-06-04 DIAGNOSIS — R7301 Impaired fasting glucose: Secondary | ICD-10-CM | POA: Diagnosis not present

## 2014-06-04 LAB — HEMOGLOBIN A1C: Hgb A1c MFr Bld: 5.6 % (ref 4.6–6.5)

## 2014-06-05 ENCOUNTER — Other Ambulatory Visit: Payer: Self-pay | Admitting: Family Medicine

## 2014-06-05 MED ORDER — ROSUVASTATIN CALCIUM 40 MG PO TABS: 40.0000 mg | ORAL_TABLET | Freq: Every day | ORAL | Status: DC

## 2014-06-07 ENCOUNTER — Other Ambulatory Visit: Payer: Self-pay | Admitting: Family Medicine

## 2014-06-07 DIAGNOSIS — E785 Hyperlipidemia, unspecified: Secondary | ICD-10-CM

## 2014-09-23 HISTORY — PX: TRANSTHORACIC ECHOCARDIOGRAM: SHX275

## 2014-11-12 ENCOUNTER — Encounter: Payer: 59 | Admitting: Family Medicine

## 2014-12-03 ENCOUNTER — Ambulatory Visit (INDEPENDENT_AMBULATORY_CARE_PROVIDER_SITE_OTHER): Payer: 59 | Admitting: Family Medicine

## 2014-12-03 ENCOUNTER — Encounter: Payer: Self-pay | Admitting: Family Medicine

## 2014-12-03 VITALS — BP 137/81 | HR 46 | Temp 97.5°F | Resp 17 | Ht >= 80 in | Wt 324.0 lb

## 2014-12-03 DIAGNOSIS — Z0000 Encounter for general adult medical examination without abnormal findings: Secondary | ICD-10-CM | POA: Diagnosis not present

## 2014-12-03 LAB — COMPREHENSIVE METABOLIC PANEL
ALT: 35 U/L (ref 0–53)
AST: 24 U/L (ref 0–37)
Albumin: 4.5 g/dL (ref 3.5–5.2)
Alkaline Phosphatase: 80 U/L (ref 39–117)
BILIRUBIN TOTAL: 0.6 mg/dL (ref 0.2–1.2)
BUN: 12 mg/dL (ref 6–23)
CHLORIDE: 103 meq/L (ref 96–112)
CO2: 31 meq/L (ref 19–32)
Calcium: 9.8 mg/dL (ref 8.4–10.5)
Creatinine, Ser: 1.05 mg/dL (ref 0.40–1.50)
GFR: 83.71 mL/min (ref >60.00)
GLUCOSE: 105 mg/dL — AB (ref 70–99)
Potassium: 4.7 mEq/L (ref 3.5–5.1)
Sodium: 143 mEq/L (ref 135–145)
Total Protein: 6.9 g/dL (ref 6.0–8.3)

## 2014-12-03 LAB — LIPID PANEL
CHOL/HDL RATIO: 3
Cholesterol: 128 mg/dL (ref 0–200)
HDL: 38.7 mg/dL — AB (ref >39.00)
LDL CALC: 59 mg/dL (ref 0–99)
NONHDL: 89.31
Triglycerides: 152 mg/dL — ABNORMAL HIGH (ref 0.0–149.0)
VLDL: 30.4 mg/dL (ref 0.0–40.0)

## 2014-12-03 LAB — CBC WITH DIFFERENTIAL/PLATELET
BASOS ABS: 0 10*3/uL (ref 0.0–0.1)
Basophils Relative: 0.5 % (ref 0.0–3.0)
EOS ABS: 0.4 10*3/uL (ref 0.0–0.7)
Eosinophils Relative: 4.8 % (ref 0.0–5.0)
HCT: 43.3 % (ref 39.0–52.0)
Hemoglobin: 14.3 g/dL (ref 13.0–17.0)
LYMPHS ABS: 3 10*3/uL (ref 0.7–4.0)
Lymphocytes Relative: 34.3 % (ref 12.0–46.0)
MCHC: 33.1 g/dL (ref 30.0–36.0)
MCV: 80.9 fl (ref 78.0–100.0)
MONOS PCT: 6.9 % (ref 3.0–12.0)
Monocytes Absolute: 0.6 10*3/uL (ref 0.1–1.0)
NEUTROS ABS: 4.7 10*3/uL (ref 1.4–7.7)
NEUTROS PCT: 53.5 % (ref 43.0–77.0)
Platelets: 231 10*3/uL (ref 150.0–400.0)
RBC: 5.35 Mil/uL (ref 4.22–5.81)
RDW: 14.9 % (ref 11.5–15.5)
WBC: 8.9 10*3/uL (ref 4.0–10.5)

## 2014-12-03 LAB — TSH: TSH: 1.95 u[IU]/mL (ref 0.35–4.50)

## 2014-12-03 NOTE — Progress Notes (Signed)
Pre visit review using our clinic review tool, if applicable. No additional management support is needed unless otherwise documented below in the visit note. 
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
## 2014-12-05 ENCOUNTER — Encounter: Payer: Self-pay | Admitting: Family Medicine

## 2014-12-06 ENCOUNTER — Other Ambulatory Visit (INDEPENDENT_AMBULATORY_CARE_PROVIDER_SITE_OTHER): Payer: 59

## 2014-12-06 DIAGNOSIS — R7301 Impaired fasting glucose: Secondary | ICD-10-CM

## 2014-12-06 LAB — HEMOGLOBIN A1C: HEMOGLOBIN A1C: 5.7 % (ref 4.6–6.5)

## 2014-12-22 ENCOUNTER — Encounter: Payer: Self-pay | Admitting: Family Medicine

## 2015-01-04 ENCOUNTER — Encounter: Payer: Self-pay | Admitting: Family Medicine

## 2015-02-07 MED FILL — ROSUVASTATIN CALCIUM 40 MG: 40 | 90 days supply | Qty: 90 | Fill #3

## 2015-02-07 MED FILL — CITALOPRAM HBR 20 MG TABLET: 20 | 90 days supply | Qty: 180 | Fill #3

## 2015-02-07 MED FILL — METOPROLOL TARTRATE 100 MG: 100 | 90 days supply | Qty: 180 | Fill #3

## 2015-04-11 MED FILL — AMLODIPINE BESYLATE 5 MG TA: 5 | 90 days supply | Qty: 180 | Fill #1

## 2015-04-18 ENCOUNTER — Ambulatory Visit (INDEPENDENT_AMBULATORY_CARE_PROVIDER_SITE_OTHER): Payer: 59 | Admitting: Family Medicine

## 2015-04-18 ENCOUNTER — Encounter: Payer: Self-pay | Admitting: Family Medicine

## 2015-04-18 VITALS — BP 126/79 | HR 54 | Temp 97.6°F | Resp 20 | Wt 321.2 lb

## 2015-04-18 DIAGNOSIS — M7521 Bicipital tendinitis, right shoulder: Secondary | ICD-10-CM

## 2015-04-18 MED ORDER — HYDROCODONE-ACETAMINOPHEN 5-325 MG PO TABS: 1.0000 | ORAL_TABLET | Freq: Four times a day (QID) | ORAL | Status: DC | PRN

## 2015-04-18 MED ORDER — PREDNISONE 20 MG PO TABS: 20.0000 mg | ORAL_TABLET | Freq: Every day | ORAL | Status: DC
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
## 2015-04-18 NOTE — Patient Instructions (Signed)
Biceps Tendon Tendinitis (Distal) With Rehab Tendinitis involves inflammation and pain over the affected tendon. The distal biceps tendon (near the elbow) is vulnerable to tendinitis. Distal biceps tendonitis is usually due to the bony bump near the elbow (bicipital tuberosity) causing increased friction over the tendon. The biceps tendon attaches the biceps muscle to one bone in the elbow and two in the shoulder. It is important for proper function of the elbow and for turning the palm upward (supination). SYMPTOMS   Pain, aching, tenderness, and sometimes warmth or redness over the front of the elbow.  Pain when bending the elbow or turning the palm up, using the wrist, especially if performed against resistance.  Crackling sound (crepitation) when the tendon or elbow is moved or touched. CAUSES  The symptoms of biceps tendonitis are due to inflammation of the tendon. Inflammation may be caused by:  Strain from sudden increase in amount or intensity of activity.  Direct blow or injury to the elbow (uncommon).  Overuse or repetitive elbow bending or wrist rotation, particularly when turning the palm up, or with elbow hyperextension. RISK INCREASES WITH:  Sports that involve contact or overhead arm activity (throwing sports, gymnastics, weightlifting, bodybuilding, rock climbing).  Heavy labor.  Poor strength and flexibility.  Failure to warm up properly before activity.  Injury to other structures of the elbow.  Restraint of the elbow. PREVENTION  Warm up and stretch properly before activity.  Allow time for recovery between activities.  Maintain physical fitness:  Strength, flexibility, and endurance.  Cardiovascular fitness.  Learn and use proper exercise technique. PROGNOSIS  With proper treatment, biceps tendon tendonitis is usually curable within 6 weeks.  RELATED COMPLICATIONS   Longer healing time if not properly treated or if not given enough time to  heal.  Chronically inflamed tendon that causes persistent pain with activity, that may progress to constant pain and potentially rupture of the tendon.  Recurring symptoms, especially if activity is resumed too soon, with overuse or with poor technique. TREATMENT  Treatment first involves ice and medicine to reduce pain and inflammation. Modify activities that cause pain, to reduce the chances of causing the condition to get worse. Strengthening and stretching exercises should be performed to promote proper use of the muscles of the elbow. These exercise may be performed at home or with a therapist. Other treatments may be given such as ultrasound or heat therapy. Surgery is usually not recommended.  MEDICATION  If pain medicine is needed, nonsteroidal anti-inflammatory medicines (aspirin and ibuprofen), or other minor pain relievers (acetaminophen), are often advised.  Do not take pain relieving medication for 7 days before surgery.  Prescription pain relievers may be given if your caregiver thinks they are needed. Use only as directed and only as much as you need. HEAT AND COLD:   Cold treatment (icing) should be applied for 10 to 15 minutes every 2 to 3 hours for inflammation and pain, and immediately after activity that aggravates your symptoms. Use ice packs or an ice massage.  Heat treatment may be used before performing stretching and strengthening activities prescribed by your caregiver, physical therapist, or athletic trainer. Use a heat pack or a warm water soak. SEEK MEDICAL CARE IF:   Symptoms get worse or do not improve in 2 weeks, despite treatment.  New, unexplained symptoms develop. (Drugs used in treatment may produce side effects.) EXERCISES  RANGE OF MOTION (ROM) AND STRETCHING EXERCISES - Biceps Tendon Tendinitis (Distal) These exercises may help you when beginning to   rehabilitate your injury. Your symptoms may go away with or without further involvement from your  physician, physical therapist, or athletic trainer. While completing these exercises, remember:   Restoring tissue flexibility helps normal motion to return to the joints. This allows healthier, less painful movement and activity.  An effective stretch should be held for at least 30 seconds.  A stretch should never be painful. You should only feel a gentle lengthening or release in the stretched tissue. STRETCH - Elbow Flexors   Lie on a firm bed or countertop on your back. Be sure that you are in a comfortable position which will allow you to relax your arm muscles.  Place a folded towel under your right / left upper arm, so that your elbow and shoulder are at the same height. Extend your arm; your elbow should not rest on the bed or towel.  Allow the weight of your hand to straighten your elbow. Keep your arm and chest muscles relaxed. Your caretaker may ask you to increase the intensity of your stretch by adding a small wrist or hand weight.  Hold for __________ seconds. You should feel a stretch on the inside of your elbow. Slowly return to the starting position. Repeat __________ times. Complete this exercise __________ times per day. RANGE OF MOTION - Supination, Active   Stand or sit with your elbows at your side. Bend your right / left elbow to 90 degrees.  Turn your palm upward until you feel a gentle stretch on the inside of your forearm.  Hold this position for __________ seconds. Slowly release and return to the starting position. Repeat __________ times. Complete this stretch __________ times per day.  RANGE OF MOTION - Pronation, Active   Stand or sit with your elbows at your side. Bend your right / left elbow to 90 degrees.  Turn your palm downward until you feel a gentle stretch on the top of your forearm.  Hold this position for __________ seconds. Slowly release and return to the starting position. Repeat __________ times. Complete this stretch __________ times per  day.  STRENGTHENING EXERCISES - Biceps Tendon Tendinitis (Distal) These exercises may help you when beginning to rehabilitate your injury. They may resolve your symptoms with or without further involvement from your physician, physical therapist or athletic trainer. While completing these exercises, remember:   Muscles can gain both the endurance and the strength needed for everyday activities through controlled exercises.  Complete these exercises as instructed by your physician, physical therapist or athletic trainer. Increase the resistance and repetitions only as guided.  You may experience muscle soreness or fatigue, but the pain or discomfort you are trying to eliminate should never get worse during these exercises. If this pain does get worse, stop and make sure you are following the directions exactly. If the pain is still present after adjustments, discontinue the exercise until you can discuss the trouble with your clinician. STRENGTH - Elbow Flexors, Isometric   Stand or sit upright on a firm surface. Place your right / left arm so that your hand is palm-up and at the height of your waist.  Place your opposite hand on top of your forearm. Gently push down as your right / left arm resists. Push as hard as you can with both arms without causing any pain or movement at your right / left elbow. Hold this stationary position for __________ seconds.  Gradually release the tension in both arms. Allow your muscles to relax completely before repeating. Repeat   __________ times. Complete this exercise __________ times per day. STRENGTH - Forearm Supinators   Sit with your right / left forearm supported on a table, keeping your elbow below shoulder height. Rest your hand over the edge, palm down.  Gently grip a hammer or a soup ladle.  Without moving your elbow, slowly turn your palm and hand upward to a "thumbs-up" position.  Hold this position for __________ seconds. Slowly return to the  starting position. Repeat __________ times. Complete this exercise __________ times per day.  STRENGTH - Forearm Pronators   Sit with your right / left forearm supported on a table, keeping your elbow below shoulder height. Rest your hand over the edge, palm up.  Gently grip a hammer or a soup ladle.  Without moving your elbow, slowly turn your palm and hand upward to a "thumbs-up" position.  Hold this position for __________ seconds. Slowly return to the starting position. Repeat __________ times. Complete this exercise __________ times per day.  STRENGTH - Elbow Flexors, Supinated  With good posture, stand or sit on a firm chair without armrests. Allow your right / left arm to rest at your side with your palm facing forward.  Holding a __________ weight, or gripping a rubber exercise band or tubing, bring your hand toward your shoulder.  Allow your muscles to control the resistance as your hand returns to your side. Repeat __________ times. Complete this exercise __________ times per day.  STRENGTH - Elbow Flexors, Neutral  With good posture, stand or sit on a firm chair without armrests. Allow your right / left arm to rest at your side with your thumb facing forward.  Holding a __________ weight, or gripping a rubber exercise band or tubing, bring your hand toward your shoulder.  Allow your muscles to control the resistance as your hand returns to your side. Repeat __________ times. Complete this exercise __________ times per day.    This information is not intended to replace advice given to you by your health care provider. Make sure you discuss any questions you have with your health care provider.   Document Released: 01/22/2005 Document Revised: 02/12/2014 Document Reviewed: 05/06/2008 Elsevier Interactive Patient Education 2016 [employer]  

## 2015-04-28 ENCOUNTER — Ambulatory Visit: Payer: 59 | Admitting: Family Medicine

## 2015-04-29 ENCOUNTER — Encounter: Payer: Self-pay | Admitting: Family Medicine

## 2015-04-29 ENCOUNTER — Ambulatory Visit (INDEPENDENT_AMBULATORY_CARE_PROVIDER_SITE_OTHER): Payer: 59 | Admitting: Family Medicine

## 2015-04-29 VITALS — BP 120/73 | HR 55 | Temp 97.7°F | Resp 20 | Wt 323.8 lb

## 2015-04-29 DIAGNOSIS — Q87410 Marfan's syndrome with aortic dilation: Secondary | ICD-10-CM

## 2015-04-29 DIAGNOSIS — M25511 Pain in right shoulder: Secondary | ICD-10-CM | POA: Diagnosis not present

## 2015-04-29 DIAGNOSIS — M25519 Pain in unspecified shoulder: Secondary | ICD-10-CM | POA: Insufficient documentation

## 2015-04-29 NOTE — Patient Instructions (Signed)
Referral placed today. Rest, heat/Ice.  Pectoralis Major Rupture With Rehab The pectoralis major is the main muscle of the chest. It is responsible for bringing the arm across the body and for rotating the arm inward. A pectoralis major rupture is a tear in the tendon that attaches the chest muscle to the upper arm bone (humerus). When the tendon is torn, there is a loss of connection between the muscle and the bone. Pectoralis major ruptures usually involve the tendon pulling off from the arm bone, although sometimes the muscle may tear in the mid-belly or at the point where the muscle becomes tendon. SYMPTOMS   A "pop" or tearing, and severe sharp, often burning, pain in the chest at the time of injury.  Tenderness, swelling, warmth, or redness and later bruising (contusion) over and around the pectoralis muscle-tendon, chest, and armpit region.  Pain and weakness when trying to use the chest muscle.  Loss of shape of the armpit region, especially when pushing your hands together in front of your body.  Loss of firm fullness, when pushing on the area where the tendon ruptured (a defect between the ends of the tendon and bone where they separated from each other). CAUSES  The pectoralis major muscle or tendon tears when a force is placed on it that is greater than it can handle. Common causes of injury include:  Sudden episode of stressful over-activity.  Direct hit (trauma) to the chest.  Fall from a height. RISK INCREASES WITH:  Sports that require excessive muscle stress, (weightlifting).  Contact sports with minimal protective devices for the chest.  Wrestling.  Poor strength and flexibility.  Previous injury to the chest muscle.  Untreated pectoralis major tendinitis.  Corticosteroid injection into the pectoralis major tendon. (Corticosteroid injections damage tendons.)  Oral anabolic steroid use. PREVENTION  Warm up and stretch properly before activity.  Allow for  adequate recovery between workouts.  Maintain physical fitness:  Strength, flexibility, and endurance.  Cardiovascular fitness. PROGNOSIS  If treated properly, pectoralis major ruptures are usually curable, with a return to sports in 6 to 9 months.  RELATED COMPLICATIONS   Weakness of the pectoralis major, especially if left untreated.  Re-rupture of the tendon after treatment.  Prolonged disability.  Risks of surgery: infection, injury to nerves (numbness, weakness, or paralysis), bleeding, hematoma (blood clot), pseudocyst (collection of fluid), shoulder stiffness, shoulder weakness, and pain with strenuous activity.  Loss of chest or armpit shape.  Inability to repair rupture. TREATMENT Treatment first involves resting from any activities that aggravate the symptoms. The use of ice and medicine will help reduce pain and inflammation. The use of strengthening and stretching exercises may help reduce pain with activity. These exercises may be performed at home. However, referral to a therapist may be advised for further evaluation and treatment, such as ultrasound therapy. If the rupture occurs in the muscle or the muscle-tendon juncture, surgery repair is not possible. However, for tears that occur at the attachment site of the arm bone, surgery may be advised. Without surgery, the loss of normal armpit shape and weakness of the shoulder will persist. For the best chance of a successful outcome, surgery must be performed within the first few weeks after injury. After surgery, the chest and shoulder of the affected side are restrained, to allow for healing. After restraint, it is important to perform strengthening and stretching exercises to help regain strength and a full range of motion.  MEDICATION   If pain medicine is needed, nonsteroidal  anti-inflammatory medicines (aspirin and ibuprofen), or other minor pain relievers (acetaminophen), are often advised.  Do not take pain medicine  for 7 days before surgery.  Prescription pain relievers may be given, if your caregiver thinks they are needed. Use only as directed and only as much as you need. COLD THERAPY  Cold treatment (icing) should be applied for 10 to 15 minutes every 2 to 3 hours for inflammation and pain, and immediately after activity that aggravates your symptoms. Use ice packs or an ice massage. SEEK MEDICAL CARE IF:  Pain increases, despite treatment.  Any of the following occur after surgery: signs of infection, including fever, increased pain, swelling, redness, drainage of fluids, or bleeding in the affected area.  New, unexplained symptoms develop. (Drugs used in treatment may produce side effects.)
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
## 2015-05-02 DIAGNOSIS — M25511 Pain in right shoulder: Secondary | ICD-10-CM | POA: Diagnosis not present

## 2015-05-03 ENCOUNTER — Ambulatory Visit (INDEPENDENT_AMBULATORY_CARE_PROVIDER_SITE_OTHER): Payer: 59 | Admitting: Family Medicine

## 2015-05-03 ENCOUNTER — Encounter: Payer: Self-pay | Admitting: Family Medicine

## 2015-05-03 VITALS — BP 131/75 | HR 44 | Temp 97.5°F | Ht >= 80 in | Wt 326.0 lb

## 2015-05-03 DIAGNOSIS — E785 Hyperlipidemia, unspecified: Secondary | ICD-10-CM | POA: Diagnosis not present

## 2015-05-03 DIAGNOSIS — R7301 Impaired fasting glucose: Secondary | ICD-10-CM | POA: Diagnosis not present

## 2015-05-03 DIAGNOSIS — Z114 Encounter for screening for human immunodeficiency virus [HIV]: Secondary | ICD-10-CM

## 2015-05-03 DIAGNOSIS — I10 Essential (primary) hypertension: Secondary | ICD-10-CM

## 2015-05-03 DIAGNOSIS — Q8740 Marfan's syndrome, unspecified: Secondary | ICD-10-CM

## 2015-05-03 DIAGNOSIS — Z1159 Encounter for screening for other viral diseases: Secondary | ICD-10-CM | POA: Diagnosis not present

## 2015-05-03 LAB — BASIC METABOLIC PANEL
BUN: 23 mg/dL (ref 6–23)
CALCIUM: 9.7 mg/dL (ref 8.4–10.5)
CO2: 33 meq/L — AB (ref 19–32)
Chloride: 100 mEq/L (ref 96–112)
Creatinine, Ser: 1.11 mg/dL (ref 0.40–1.50)
GFR: 78.35 mL/min (ref >60.00)
GLUCOSE: 90 mg/dL (ref 70–99)
Potassium: 4.6 mEq/L (ref 3.5–5.1)
SODIUM: 139 meq/L (ref 135–145)

## 2015-05-03 LAB — HEMOGLOBIN A1C: Hgb A1c MFr Bld: 5.9 % (ref 4.6–6.5)
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
## 2015-05-03 NOTE — Progress Notes (Signed)
Pre visit review using our clinic review tool, if applicable. No additional management support is needed unless otherwise documented below in the visit note. 

## 2015-05-04 LAB — HIV ANTIBODY (ROUTINE TESTING W REFLEX): HIV: NONREACTIVE

## 2015-05-18 DIAGNOSIS — M25511 Pain in right shoulder: Secondary | ICD-10-CM | POA: Diagnosis not present

## 2015-05-19 DIAGNOSIS — M25511 Pain in right shoulder: Secondary | ICD-10-CM | POA: Diagnosis not present

## 2015-05-24 ENCOUNTER — Other Ambulatory Visit: Payer: Self-pay | Admitting: Family Medicine

## 2015-05-24 MED ORDER — ROSUVASTATIN CALCIUM 40 MG PO TABS: 40.0000 mg | ORAL_TABLET | Freq: Every day | ORAL | Status: DC

## 2015-05-24 MED ORDER — AMLODIPINE BESYLATE 5 MG PO TABS: 5.0000 mg | ORAL_TABLET | Freq: Every morning | ORAL | Status: DC

## 2015-05-24 MED ORDER — METOPROLOL TARTRATE 100 MG PO TABS: ORAL_TABLET | ORAL | Status: DC

## 2015-05-24 MED FILL — METOPROLOL TARTRATE 100 MG: 100 | 90 days supply | Qty: 180 | Fill #0

## 2015-05-24 MED FILL — CITALOPRAM HBR 20 MG TABLET: 20 | 90 days supply | Qty: 180 | Fill #0

## 2015-05-24 MED FILL — ROSUVASTATIN CALCIUM 40 MG: 40 | 90 days supply | Qty: 90 | Fill #0

## 2015-05-24 NOTE — Telephone Encounter (Signed)
Pts wife called requesting refills.   RF request for amlodipine LOV: 05/03/15 Next ov: 12/05/15 Last written: 06/03/14 #90 w/ 3RF  RF request for celexa Last written: 06/03/14 #180 w/ 3RF  RF request for metoprolol Last written: 06/03/14 #180 w/ 3RF  RF request for crestor Last written: 06/05/14 #90 w/ 3Rf  Rx's sent. Pts wife advised and voiced understanding, okay per DPR.

## 2015-06-07 ENCOUNTER — Ambulatory Visit: Payer: 59 | Admitting: Physical Therapy

## 2015-06-07 NOTE — Therapy (Signed)
Patient having no pain or symptoms at this time and does not feel he needs PT.  Will call if symptoms return.

## 2015-07-12 MED FILL — AMLODIPINE BESYLATE 5 MG TA: 5 | 90 days supply | Qty: 180 | Fill #2

## 2015-08-30 MED FILL — CITALOPRAM HBR 20 MG TABLET: 20 | 90 days supply | Qty: 180 | Fill #1

## 2015-08-30 MED FILL — ROSUVASTATIN CALCIUM 40 MG: 40 | 90 days supply | Qty: 90 | Fill #1

## 2015-09-26 MED FILL — METOPROLOL TARTRATE 100 MG: 100 | 90 days supply | Qty: 180 | Fill #1

## 2015-09-29 DIAGNOSIS — Z954 Presence of other heart-valve replacement: Secondary | ICD-10-CM | POA: Diagnosis not present

## 2015-09-29 DIAGNOSIS — Z952 Presence of prosthetic heart valve: Secondary | ICD-10-CM | POA: Diagnosis not present

## 2015-10-11 MED FILL — AMLODIPINE BESYLATE 5 MG TA: 5 | 90 days supply | Qty: 180 | Fill #0

## 2015-11-18 MED FILL — CITALOPRAM HBR 20 MG TABLET: 20 | 90 days supply | Qty: 180 | Fill #2

## 2015-12-05 ENCOUNTER — Encounter: Payer: Self-pay | Admitting: Family Medicine

## 2015-12-05 ENCOUNTER — Ambulatory Visit (INDEPENDENT_AMBULATORY_CARE_PROVIDER_SITE_OTHER): Payer: 59 | Admitting: Family Medicine

## 2015-12-05 VITALS — BP 119/78 | HR 53 | Temp 97.8°F | Resp 16 | Ht >= 80 in | Wt 317.0 lb

## 2015-12-05 DIAGNOSIS — M25561 Pain in right knee: Secondary | ICD-10-CM | POA: Diagnosis not present

## 2015-12-05 DIAGNOSIS — M79661 Pain in right lower leg: Secondary | ICD-10-CM

## 2015-12-05 DIAGNOSIS — R7301 Impaired fasting glucose: Secondary | ICD-10-CM | POA: Diagnosis not present

## 2015-12-05 DIAGNOSIS — R202 Paresthesia of skin: Secondary | ICD-10-CM | POA: Diagnosis not present

## 2015-12-05 DIAGNOSIS — M25562 Pain in left knee: Secondary | ICD-10-CM

## 2015-12-05 DIAGNOSIS — Z0000 Encounter for general adult medical examination without abnormal findings: Secondary | ICD-10-CM

## 2015-12-05 DIAGNOSIS — G8929 Other chronic pain: Secondary | ICD-10-CM

## 2015-12-05 DIAGNOSIS — M79662 Pain in left lower leg: Secondary | ICD-10-CM

## 2015-12-05 MED ORDER — CITALOPRAM HYDROBROMIDE 20 MG PO TABS: 20.0000 mg | ORAL_TABLET | Freq: Two times a day (BID) | ORAL | 3 refills | Status: DC

## 2015-12-05 MED ORDER — METOPROLOL TARTRATE 100 MG PO TABS: ORAL_TABLET | ORAL | 3 refills | Status: DC

## 2015-12-05 MED ORDER — ROSUVASTATIN CALCIUM 40 MG PO TABS: 40.0000 mg | ORAL_TABLET | Freq: Every day | ORAL | 3 refills | Status: DC

## 2015-12-05 MED ORDER — GABAPENTIN 300 MG PO CAPS: 300.0000 mg | ORAL_CAPSULE | Freq: Three times a day (TID) | ORAL | 1 refills | Status: DC

## 2015-12-05 MED ORDER — AMLODIPINE BESYLATE 5 MG PO TABS: 5.0000 mg | ORAL_TABLET | Freq: Every morning | ORAL | 3 refills | Status: DC

## 2015-12-05 MED FILL — ROSUVASTATIN CALCIUM 40 MG: 40 | 90 days supply | Qty: 90 | Fill #2

## 2015-12-05 NOTE — Patient Instructions (Signed)
Take one 300mg  gabapentin tab at bedtime for 1 week.  Every week add another pill per day until you get to 1 pill three times per day.

## 2015-12-05 NOTE — Progress Notes (Signed)
Pre visit review using our clinic review tool, if applicable. No additional management support is needed unless otherwise documented below in the visit note. 
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
## 2015-12-07 DIAGNOSIS — Z872 Personal history of diseases of the skin and subcutaneous tissue: Secondary | ICD-10-CM

## 2015-12-07 HISTORY — DX: Personal history of diseases of the skin and subcutaneous tissue: Z87.2

## 2015-12-22 ENCOUNTER — Ambulatory Visit (INDEPENDENT_AMBULATORY_CARE_PROVIDER_SITE_OTHER): Payer: 59 | Admitting: Nurse Practitioner

## 2015-12-22 ENCOUNTER — Encounter: Payer: Self-pay | Admitting: Nurse Practitioner

## 2015-12-22 VITALS — BP 109/56 | HR 58 | Temp 97.2°F | Ht >= 80 in | Wt 330.0 lb

## 2015-12-22 DIAGNOSIS — L03116 Cellulitis of left lower limb: Secondary | ICD-10-CM

## 2015-12-22 MED ORDER — SULFAMETHOXAZOLE-TRIMETHOPRIM 800-160 MG PO TABS: 1.0000 | ORAL_TABLET | Freq: Two times a day (BID) | ORAL | 0 refills | Status: DC

## 2015-12-22 NOTE — Progress Notes (Signed)
   Subjective:    Patient ID: Donnamarie PoagJeffrey S Porath, male    DOB: 1975-11-20, 40 y.o.   MRN: [id number]  HPI Patient comes in today c/o right foot swollen- He hit his right shin on a trailer and has a superficial wound on lower portion of shin. Last night it started getting red and hot and foot swelling.     Review of Systems  Constitutional: Negative.   HENT: Negative.   Respiratory: Negative.   Cardiovascular: Negative.   Gastrointestinal: Negative.   Genitourinary: Negative.   Skin: Positive for wound.  Neurological: Negative.   Psychiatric/Behavioral: Negative.   All other systems reviewed and are negative.      Objective:   Physical Exam  Constitutional: He is oriented to person, place, and time. He appears well-developed and well-nourished.  Cardiovascular: Normal rate, regular rhythm and normal heart sounds.   Pulmonary/Chest: Effort normal and breath sounds normal.  Musculoskeletal: Normal range of motion.  Neurological: He is alert and oriented to person, place, and time.  Skin: Skin is warm.  3cm superficial wound to left sin with surrounding erythema and edema distally into foot. Palpable pedal pulse.  Psychiatric: He has a normal mood and affect. His behavior is normal. Judgment and thought content normal.   BP (!) 109/56   Pulse (!) 58   Temp 97.2 F (36.2 C) (Oral)   Ht 7' (2.134 m)   Wt (!) 330 lb (149.7 kg)   BMI 32.88 kg/m       Assessment & Plan:  1. Cellulitis of left lower leg Clean with antibacterial soap Do n ot pick or scratch at it RTO prn - sulfamethoxazole-trimethoprim (BACTRIM DS) 800-160 MG tablet; Take 1 tablet by mouth 2 (two) times daily.  Dispense: 14 tablet; Refill: 0   Mary-Margaret Daphine DeutscherMartin, FNP

## 2015-12-22 NOTE — Patient Instructions (Signed)
Cellulitis, Adult °Introduction °Cellulitis is a skin infection. The infected area is usually red and sore. This condition occurs most often in the arms and lower legs. It is very important to get treated for this condition. °Follow these instructions at home: °· Take over-the-counter and prescription medicines only as told by your doctor. °· If you were prescribed an antibiotic medicine, take it as told by your doctor. Do not stop taking the antibiotic even if you start to feel better. °· Drink enough fluid to keep your pee (urine) clear or pale yellow. °· Do not touch or rub the infected area. °· Raise (elevate) the infected area above the level of your heart while you are sitting or lying down. °· Place warm or cold wet cloths (warm or cold compresses) on the infected area. Do this as told by your doctor. °· Keep all follow-up visits as told by your doctor. This is important. These visits let your doctor make sure your infection is not getting worse. °Contact a doctor if: °· You have a fever. °· Your symptoms do not get better after 1-2 days of treatment. °· Your bone or joint under the infected area starts to hurt after the skin has healed. °· Your infection comes back. This can happen in the same area or another area. °· You have a swollen bump in the infected area. °· You have new symptoms. °· You feel ill and also have muscle aches and pains. °Get help right away if: °· Your symptoms get worse. °· You feel very sleepy. °· You throw up (vomit) or have watery poop (diarrhea) for a long time. °· There are red streaks coming from the infected area. °· Your red area gets larger. °· Your red area turns darker. °This information is not intended to replace advice given to you by your health care provider. Make sure you discuss any questions you have with your health care provider. °Document Released: 07/11/2007 Document Revised: 06/30/2015 Document Reviewed: 12/01/2014 °© 2017 [employer] ° °

## 2015-12-25 ENCOUNTER — Encounter: Payer: Self-pay | Admitting: Family Medicine

## 2016-01-05 ENCOUNTER — Other Ambulatory Visit: Payer: Self-pay | Admitting: *Deleted

## 2016-01-05 MED ORDER — METOPROLOL TARTRATE 100 MG PO TABS: ORAL_TABLET | ORAL | 0 refills | Status: DC

## 2016-01-05 MED FILL — METOPROLOL TARTRATE 100 MG: 100 | 90 days supply | Qty: 180 | Fill #2

## 2016-01-06 DIAGNOSIS — H5203 Hypermetropia, bilateral: Secondary | ICD-10-CM | POA: Diagnosis not present

## 2016-01-06 DIAGNOSIS — H52223 Regular astigmatism, bilateral: Secondary | ICD-10-CM | POA: Diagnosis not present

## 2016-01-18 ENCOUNTER — Ambulatory Visit (INDEPENDENT_AMBULATORY_CARE_PROVIDER_SITE_OTHER): Payer: 59 | Admitting: Family Medicine

## 2016-01-18 ENCOUNTER — Encounter: Payer: Self-pay | Admitting: Family Medicine

## 2016-01-18 DIAGNOSIS — M25562 Pain in left knee: Secondary | ICD-10-CM

## 2016-01-18 DIAGNOSIS — Z0000 Encounter for general adult medical examination without abnormal findings: Secondary | ICD-10-CM | POA: Diagnosis not present

## 2016-01-18 DIAGNOSIS — M25561 Pain in right knee: Secondary | ICD-10-CM | POA: Diagnosis not present

## 2016-01-18 DIAGNOSIS — G8929 Other chronic pain: Secondary | ICD-10-CM

## 2016-01-18 DIAGNOSIS — R7301 Impaired fasting glucose: Secondary | ICD-10-CM | POA: Diagnosis not present

## 2016-01-18 LAB — CBC WITH DIFFERENTIAL/PLATELET
BASOS PCT: 0.5 % (ref 0.0–3.0)
Basophils Absolute: 0 10*3/uL (ref 0.0–0.1)
EOS PCT: 4.6 % (ref 0.0–5.0)
Eosinophils Absolute: 0.3 10*3/uL (ref 0.0–0.7)
HEMATOCRIT: 44.9 % (ref 39.0–52.0)
HEMOGLOBIN: 14.9 g/dL (ref 13.0–17.0)
LYMPHS PCT: 34.8 % (ref 12.0–46.0)
Lymphs Abs: 2.3 10*3/uL (ref 0.7–4.0)
MCHC: 33.1 g/dL (ref 30.0–36.0)
MCV: 80.6 fl (ref 78.0–100.0)
MONOS PCT: 7.9 % (ref 3.0–12.0)
Monocytes Absolute: 0.5 10*3/uL (ref 0.1–1.0)
Neutro Abs: 3.5 10*3/uL (ref 1.4–7.7)
Neutrophils Relative %: 52.2 % (ref 43.0–77.0)
Platelets: 231 10*3/uL (ref 150.0–400.0)
RBC: 5.57 Mil/uL (ref 4.22–5.81)
RDW: 14.4 % (ref 11.5–15.5)
WBC: 6.7 10*3/uL (ref 4.0–10.5)

## 2016-01-18 LAB — URIC ACID: Uric Acid, Serum: 6.7 mg/dL (ref 4.0–7.8)

## 2016-01-18 LAB — COMPREHENSIVE METABOLIC PANEL
ALK PHOS: 82 U/L (ref 39–117)
ALT: 17 U/L (ref 0–53)
AST: 16 U/L (ref 0–37)
Albumin: 4.7 g/dL (ref 3.5–5.2)
BILIRUBIN TOTAL: 0.8 mg/dL (ref 0.2–1.2)
BUN: 17 mg/dL (ref 6–23)
CALCIUM: 9.7 mg/dL (ref 8.4–10.5)
CO2: 32 meq/L (ref 19–32)
Chloride: 101 mEq/L (ref 96–112)
Creatinine, Ser: 1.05 mg/dL (ref 0.40–1.50)
GFR: 83.23 mL/min (ref >60.00)
GLUCOSE: 97 mg/dL (ref 70–99)
POTASSIUM: 4.8 meq/L (ref 3.5–5.1)
Sodium: 139 mEq/L (ref 135–145)
TOTAL PROTEIN: 7 g/dL (ref 6.0–8.3)

## 2016-01-18 LAB — LIPID PANEL
CHOL/HDL RATIO: 3
Cholesterol: 99 mg/dL (ref 0–200)
HDL: 32.4 mg/dL — AB (ref >39.00)
LDL Cholesterol: 43 mg/dL (ref 0–99)
NONHDL: 66.12
TRIGLYCERIDES: 114 mg/dL (ref 0.0–149.0)
VLDL: 22.8 mg/dL (ref 0.0–40.0)

## 2016-01-18 LAB — HEMOGLOBIN A1C: Hgb A1c MFr Bld: 5.5 % (ref 4.6–6.5)

## 2016-01-18 LAB — TSH: TSH: 2.61 u[IU]/mL (ref 0.35–4.50)

## 2016-01-18 MED FILL — AMLODIPINE BESYLATE 5 MG TA: 5 | 90 days supply | Qty: 180 | Fill #1
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
## 2016-01-18 NOTE — Progress Notes (Signed)
Pre visit review using our clinic review tool, if applicable. No additional management support is needed unless otherwise documented below in the visit note. 

## 2016-02-15 MED FILL — CITALOPRAM HBR 20 MG TABLET: 20 | 90 days supply | Qty: 180 | Fill #3

## 2016-02-29 MED FILL — ROSUVASTATIN CALCIUM 40 MG: 40 | 90 days supply | Qty: 90 | Fill #3

## 2016-03-02 ENCOUNTER — Ambulatory Visit: Payer: 59 | Admitting: Neurology

## 2016-04-11 MED FILL — METOPROLOL TARTRATE 100 MG: 100 | 90 days supply | Qty: 180 | Fill #3 | Status: TO

## 2016-04-23 MED FILL — AMLODIPINE BESYLATE 5 MG TA: 5 | 90 days supply | Qty: 180 | Fill #2 | Status: TO

## 2016-04-27 ENCOUNTER — Other Ambulatory Visit: Payer: Self-pay | Admitting: *Deleted

## 2016-04-27 MED ORDER — AMLODIPINE BESYLATE 5 MG PO TABS: ORAL_TABLET | ORAL | 0 refills | Status: DC

## 2016-05-02 ENCOUNTER — Other Ambulatory Visit: Payer: Self-pay | Admitting: Family Medicine

## 2016-05-09 MED FILL — CITALOPRAM HBR 20 MG TABLET: 20 | 90 days supply | Qty: 180 | Fill #0 | Status: TO

## 2016-05-28 MED FILL — ROSUVASTATIN CALCIUM 40 MG: 40 | 90 days supply | Qty: 90 | Fill #0 | Status: TO

## 2016-07-18 ENCOUNTER — Ambulatory Visit (INDEPENDENT_AMBULATORY_CARE_PROVIDER_SITE_OTHER): Payer: 59 | Admitting: Family Medicine

## 2016-07-18 ENCOUNTER — Encounter: Payer: Self-pay | Admitting: Family Medicine

## 2016-07-18 ENCOUNTER — Other Ambulatory Visit: Payer: Self-pay | Admitting: Family Medicine

## 2016-07-18 VITALS — BP 110/66 | HR 44 | Temp 97.6°F | Resp 16 | Ht >= 80 in | Wt 312.2 lb

## 2016-07-18 DIAGNOSIS — M7741 Metatarsalgia, right foot: Secondary | ICD-10-CM | POA: Diagnosis not present

## 2016-07-18 DIAGNOSIS — M7742 Metatarsalgia, left foot: Secondary | ICD-10-CM | POA: Diagnosis not present

## 2016-07-18 DIAGNOSIS — R7301 Impaired fasting glucose: Secondary | ICD-10-CM

## 2016-07-18 DIAGNOSIS — I10 Essential (primary) hypertension: Secondary | ICD-10-CM

## 2016-07-18 DIAGNOSIS — E7800 Pure hypercholesterolemia, unspecified: Secondary | ICD-10-CM

## 2016-07-18 LAB — BASIC METABOLIC PANEL
BUN: 16 mg/dL (ref 6–23)
CO2: 28 mEq/L (ref 19–32)
Calcium: 9.8 mg/dL (ref 8.4–10.5)
Chloride: 102 mEq/L (ref 96–112)
Creatinine, Ser: 1.1 mg/dL (ref 0.40–1.50)
GFR: 78.68 mL/min (ref >60.00)
GLUCOSE: 87 mg/dL (ref 70–99)
POTASSIUM: 4.7 meq/L (ref 3.5–5.1)
Sodium: 138 mEq/L (ref 135–145)

## 2016-07-18 LAB — HEMOGLOBIN A1C: Hgb A1c MFr Bld: 5.6 % (ref 4.6–6.5)

## 2016-07-18 LAB — LIPID PANEL
CHOL/HDL RATIO: 4
CHOLESTEROL: 116 mg/dL (ref 0–200)
HDL: 33.1 mg/dL — AB (ref >39.00)
LDL CALC: 54 mg/dL (ref 0–99)
NonHDL: 83.16
TRIGLYCERIDES: 146 mg/dL (ref 0.0–149.0)
VLDL: 29.2 mg/dL (ref 0.0–40.0)

## 2016-07-18 MED FILL — METOPROLOL TARTRATE 100 MG: 100 | 90 days supply | Qty: 180 | Fill #0
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
## 2016-07-24 MED FILL — AMLODIPINE BESYLATE 5 MG TA: 5 | 90 days supply | Qty: 180 | Fill #0

## 2016-07-26 DIAGNOSIS — H5213 Myopia, bilateral: Secondary | ICD-10-CM | POA: Diagnosis not present

## 2016-07-31 MED FILL — CITALOPRAM HBR 20 MG TABLET: 20 | 90 days supply | Qty: 180 | Fill #0

## 2016-08-09 ENCOUNTER — Ambulatory Visit: Payer: 59 | Admitting: Podiatry

## 2016-08-10 ENCOUNTER — Encounter: Payer: 59 | Admitting: Podiatry

## 2016-09-03 MED FILL — ROSUVASTATIN CALCIUM 40 MG: 40 | 90 days supply | Qty: 90 | Fill #0

## 2016-09-18 ENCOUNTER — Ambulatory Visit (INDEPENDENT_AMBULATORY_CARE_PROVIDER_SITE_OTHER): Payer: 59 | Admitting: Nurse Practitioner

## 2016-09-18 ENCOUNTER — Other Ambulatory Visit: Payer: Self-pay | Admitting: Nurse Practitioner

## 2016-09-18 ENCOUNTER — Encounter: Payer: Self-pay | Admitting: Nurse Practitioner

## 2016-09-18 ENCOUNTER — Ambulatory Visit (INDEPENDENT_AMBULATORY_CARE_PROVIDER_SITE_OTHER): Payer: 59

## 2016-09-18 VITALS — BP 105/60 | HR 48 | Temp 97.5°F

## 2016-09-18 DIAGNOSIS — M25561 Pain in right knee: Secondary | ICD-10-CM

## 2016-09-18 DIAGNOSIS — G8929 Other chronic pain: Secondary | ICD-10-CM | POA: Diagnosis not present

## 2016-09-18 DIAGNOSIS — M25562 Pain in left knee: Secondary | ICD-10-CM

## 2016-09-18 IMAGING — DX DG KNEE 1-2V*R*
2 series · 2 of 2 positions shown · non-contrast
Comparison: [DATE] .

CLINICAL DATA: Acute onset of knee pain.

EXAM:
RIGHT KNEE - 1-2 VIEW

[knee ap]
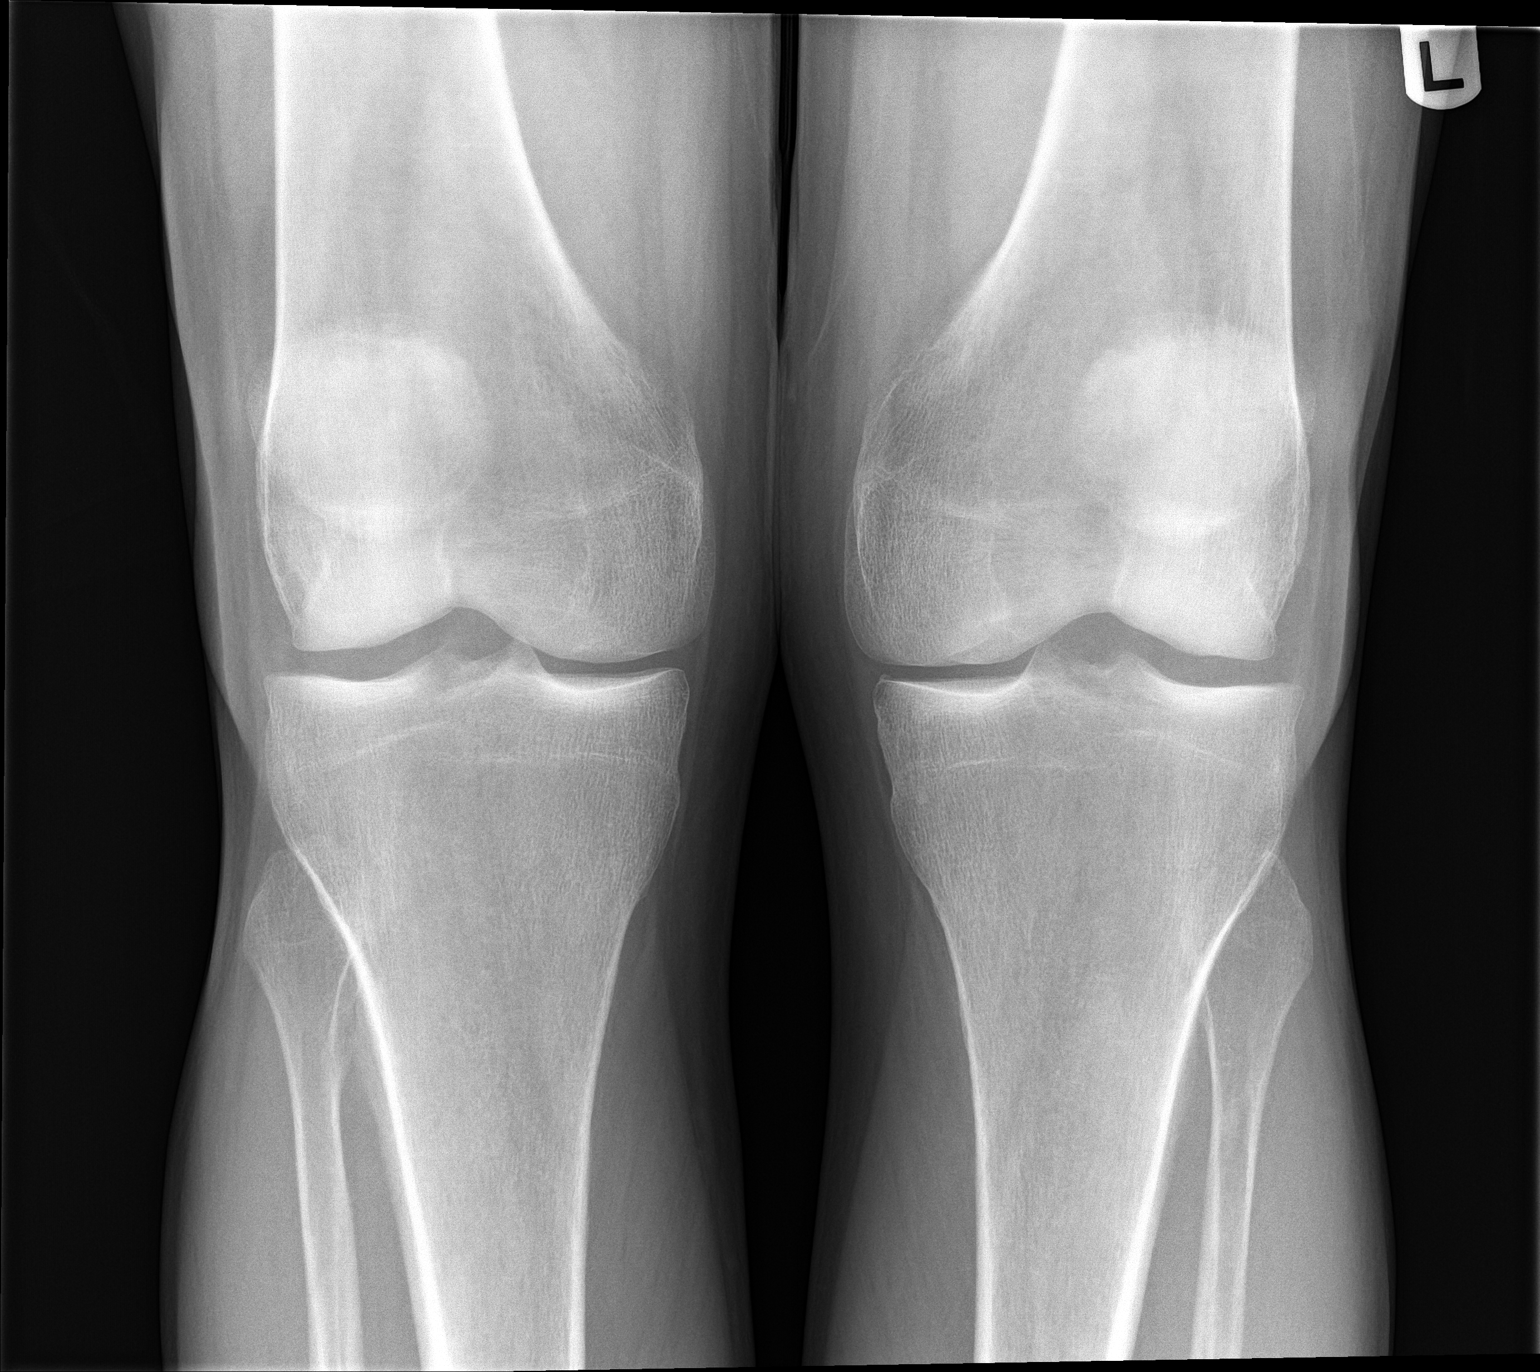

[knee lat]
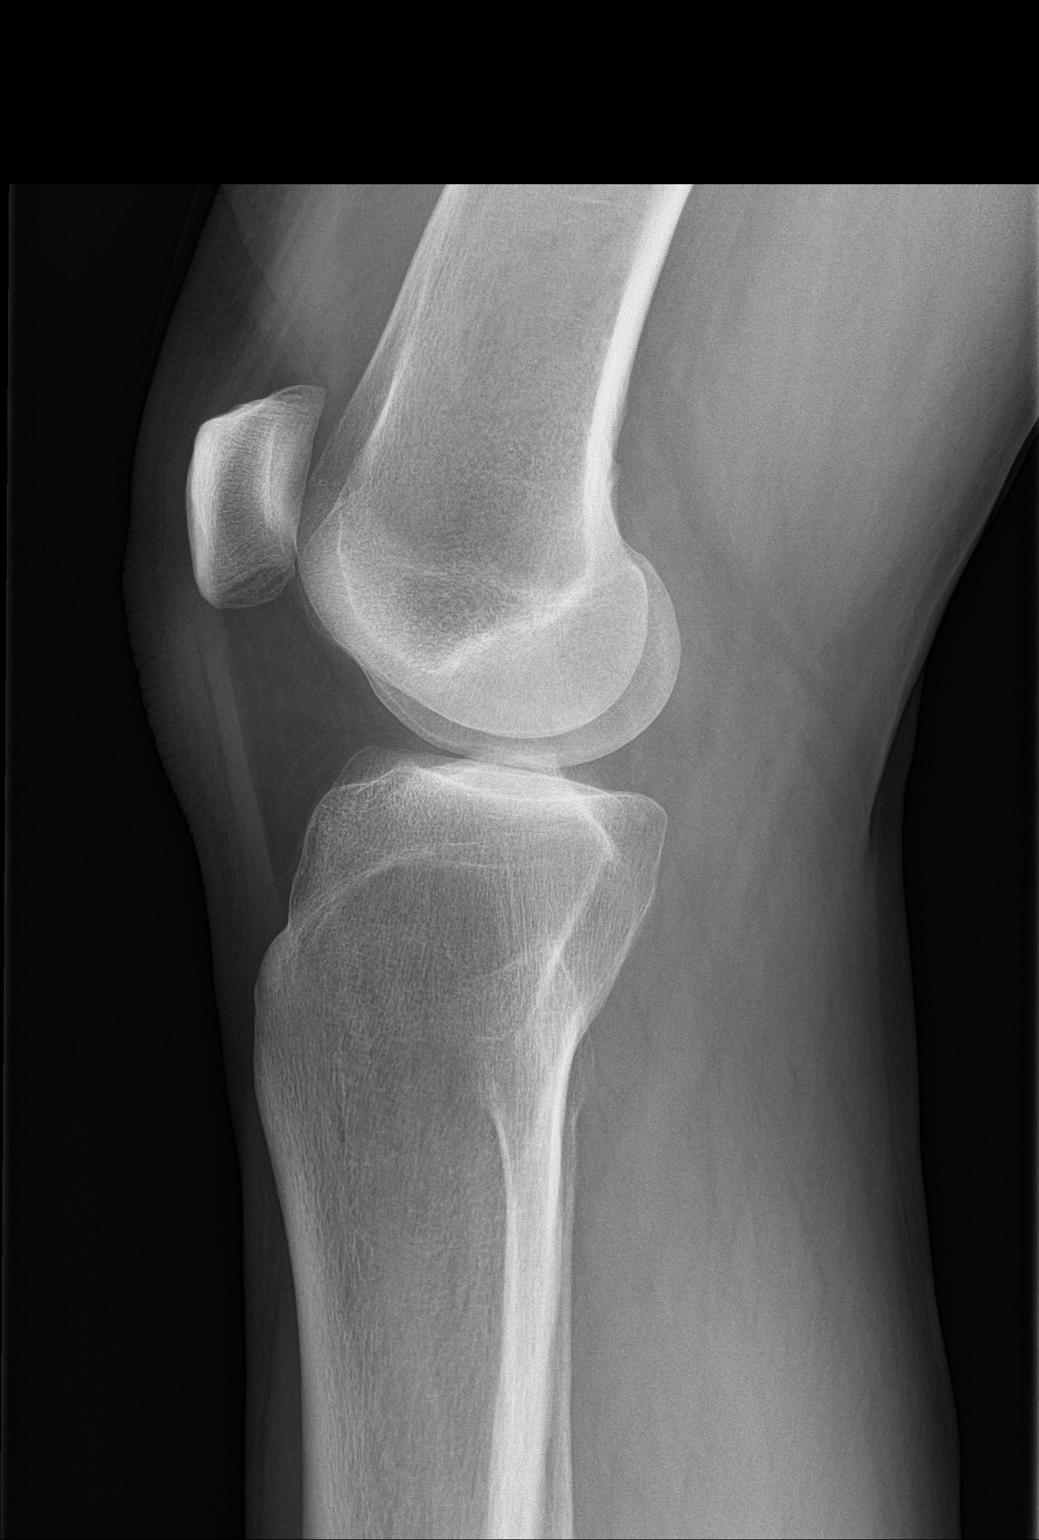

[2 of 2 positions shown; findings below may reference images not displayed]

FINDINGS: No acute bony or joint abnormality identified. No evidence of
fracture or dislocation.
IMPRESSION: No acute bony abnormality.

## 2016-09-18 MED ORDER — METHYLPREDNISOLONE ACETATE 40 MG/ML IJ SUSP
40.0000 mg | Freq: Once | INTRAMUSCULAR | Status: AC
  Administered 2016-09-18: 40 mg via INTRA_ARTICULAR

## 2016-09-18 MED ORDER — BUPIVACAINE HCL 0.25 % IJ SOLN
1.0000 mL | Freq: Once | INTRAMUSCULAR | Status: AC
  Administered 2016-09-18: 1 mL via INTRA_ARTICULAR

## 2016-09-18 NOTE — Progress Notes (Signed)
   Subjective:    Patient ID: Carl PoagJeffrey S Laramie, male    DOB: March 14, 1975, 41 y.o.   MRN: [id number]  HPI Patient comes in c/o right knee pain. He has had this off and on in the past. Started bother ing him about 2 weeks ago. Swells at times if he is up on it a lot. Pain is rated 1/10 right now with sitting but standing and walking increases pain to 8/10. He has tried ibuprofen and that will help some but not much. In the past a joint injection has helped.   Review of Systems  Constitutional: Negative.   Respiratory: Negative.   Cardiovascular: Negative.   Gastrointestinal: Negative.   Musculoskeletal: Positive for arthralgias (right knee).  Neurological: Negative.   Psychiatric/Behavioral: Negative.   All other systems reviewed and are negative.      Objective:   Physical Exam  Constitutional: He is oriented to person, place, and time. He appears well-developed and well-nourished.  Cardiovascular: Normal rate and regular rhythm.   Pulmonary/Chest: Effort normal and breath sounds normal.  Musculoskeletal:  MIld right knee infusion- no patella tenderness- FROM with pain on full extension. Crepitus on flexion and extension.  Neurological: He is alert and oriented to person, place, and time.  Skin: Skin is warm and dry.  Psychiatric: His behavior is normal. Judgment and thought content normal.   BP 105/60   Pulse (!) 48   Temp (!) 97.5 F (36.4 C) (Oral)   Right knee xray- mild medial joint space narrowing-Preliminary reading by Paulene FloorMary Kasiya Burck, FNP  [facility]  Right knee injection under sterile technique    Assessment & Plan:   1. Chronic pain of right knee   right joint injection Ice BID Rest  elevate when sitting RTO prn  Mary-Margaret Daphine DeutscherMartin, FNP

## 2016-09-18 NOTE — Patient Instructions (Signed)
Facet Joint Block, Care After Refer to this sheet in the next few weeks. These instructions provide you with information about caring for yourself after your procedure. Your health care provider may also give you more specific instructions. Your treatment has been planned according to current medical practices, but problems sometimes occur. Call your health care provider if you have any problems or questions after your procedure. What can I expect after the procedure? After the procedure, it is common to have:  Some tenderness over the injection sites for 2 days after the procedure.  A temporary increase in blood sugar if you have diabetes.  Follow these instructions at home:  Keep track of the amount of pain relief you feel and how long it lasts.  Take over-the-counter and prescription medicines only as told by your health care provider. You may need to limit pain medicine within the first 4-6 hours after the procedure.  Remove your bandages (dressings) the morning after the procedure.  For the first 24 hours after the procedure: ? Do not apply heat near or over the injection sites. ? Do not take a bath or soak in water, such as in a pool or lake. ? Do not drive or operate heavy machinery unless approved by your health care provider. ? Avoid activities that require a lot of energy.  If the injection site is tender, try applying ice to the area. To do this: ? Put ice in a plastic bag. ? Place a towel between your skin and the bag. ? Leave the ice on for 20 minutes, 2-3 times a day.  Keep all follow-up visits as told by your health care provider. This is important. Contact a health care provider if:  Fluid is coming from an injection site.  There is significant bleeding or swelling at an injection site.  You have diabetes and your blood sugar is above 180 mg/dL. Get help right away if:  You have a fever.  You have worsening pain or swelling around an injection site.  There  are red streaks around an injection site.  You develop severe pain that is not controlled by your medicines.  You develop a headache, stiff neck, nausea, or vomiting.  Your eyes become very sensitive to light.  You have weakness, paralysis, or tingling in your arms or legs that was not present before the procedure.  You have difficulty urinating or breathing. This information is not intended to replace advice given to you by your health care provider. Make sure you discuss any questions you have with your health care provider. Document Released: 01/09/2012 Document Revised: 06/08/2015 Document Reviewed: 10/18/2014 Elsevier Interactive Patient Education  2018 Elsevier Inc.  

## 2016-09-24 ENCOUNTER — Other Ambulatory Visit: Payer: Self-pay | Admitting: *Deleted

## 2016-09-24 DIAGNOSIS — M25561 Pain in right knee: Principal | ICD-10-CM

## 2016-09-24 DIAGNOSIS — G8929 Other chronic pain: Secondary | ICD-10-CM

## 2016-09-25 ENCOUNTER — Telehealth: Payer: Self-pay | Admitting: *Deleted

## 2016-09-25 DIAGNOSIS — M25561 Pain in right knee: Secondary | ICD-10-CM | POA: Diagnosis not present

## 2016-09-25 NOTE — Telephone Encounter (Signed)
Wife called and stated that patient still having terrible knee pain. MRI ordered and knee brace given. Awaiting results

## 2016-09-28 ENCOUNTER — Ambulatory Visit (HOSPITAL_COMMUNITY): Payer: 59 | Attending: Nurse Practitioner

## 2016-10-03 ENCOUNTER — Encounter: Payer: Self-pay | Admitting: Family Medicine

## 2016-10-04 DIAGNOSIS — Z954 Presence of other heart-valve replacement: Secondary | ICD-10-CM | POA: Diagnosis not present

## 2016-10-04 DIAGNOSIS — Z95828 Presence of other vascular implants and grafts: Secondary | ICD-10-CM | POA: Insufficient documentation

## 2016-10-04 DIAGNOSIS — Z952 Presence of prosthetic heart valve: Secondary | ICD-10-CM | POA: Diagnosis not present

## 2016-10-10 NOTE — Progress Notes (Signed)
This encounter was created in error - please disregard.

## 2016-10-16 MED FILL — METOPROLOL TARTRATE 100 MG: 100 | 90 days supply | Qty: 180 | Fill #1

## 2016-10-19 MED FILL — CITALOPRAM HBR 20 MG TABLET: 20 | 90 days supply | Qty: 180 | Fill #1

## 2016-11-09 ENCOUNTER — Other Ambulatory Visit: Payer: Self-pay

## 2016-11-09 MED ORDER — AMLODIPINE BESYLATE 5 MG PO TABS: ORAL_TABLET | ORAL | 1 refills | Status: DC

## 2016-11-09 MED FILL — AMLODIPINE BESYLATE 5 MG TA: 5 | 90 days supply | Qty: 180 | Fill #0

## 2016-12-12 ENCOUNTER — Other Ambulatory Visit: Payer: Self-pay | Admitting: *Deleted

## 2016-12-12 MED ORDER — METOPROLOL TARTRATE 100 MG PO TABS: 100.0000 mg | ORAL_TABLET | Freq: Two times a day (BID) | ORAL | 3 refills | Status: DC

## 2016-12-12 MED ORDER — ROSUVASTATIN CALCIUM 40 MG PO TABS: 40.0000 mg | ORAL_TABLET | Freq: Every day | ORAL | 3 refills | Status: DC

## 2016-12-12 MED ORDER — CITALOPRAM HYDROBROMIDE 20 MG PO TABS: 20.0000 mg | ORAL_TABLET | Freq: Two times a day (BID) | ORAL | 3 refills | Status: DC

## 2016-12-12 MED FILL — ROSUVASTATIN CALCIUM 40 MG: 40 | 90 days supply | Qty: 90 | Fill #0

## 2017-01-21 MED FILL — CITALOPRAM HBR 20 MG TABLET: 20 | 90 days supply | Qty: 180 | Fill #0

## 2017-01-21 MED FILL — METOPROLOL TARTRATE 100 MG: 100 | 90 days supply | Qty: 180 | Fill #0

## 2017-01-22 ENCOUNTER — Encounter: Payer: 59 | Admitting: Family Medicine
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
## 2017-01-23 ENCOUNTER — Encounter: Payer: Self-pay | Admitting: Family Medicine

## 2017-02-06 MED FILL — AMLODIPINE BESYLATE 5 MG TA: 5 | 90 days supply | Qty: 180 | Fill #1

## 2017-02-11 MED FILL — SHIPPING COST: 1 days supply | Qty: 1 | Fill #0

## 2017-02-21 DIAGNOSIS — Q8740 Marfan's syndrome, unspecified: Secondary | ICD-10-CM | POA: Diagnosis not present

## 2017-02-21 DIAGNOSIS — Z8481 Family history of carrier of genetic disease: Secondary | ICD-10-CM | POA: Diagnosis not present

## 2017-02-27 DIAGNOSIS — H52223 Regular astigmatism, bilateral: Secondary | ICD-10-CM | POA: Diagnosis not present

## 2017-03-01 ENCOUNTER — Encounter: Payer: Self-pay | Admitting: Nurse Practitioner

## 2017-03-01 ENCOUNTER — Ambulatory Visit (INDEPENDENT_AMBULATORY_CARE_PROVIDER_SITE_OTHER): Payer: 59 | Admitting: Nurse Practitioner

## 2017-03-01 VITALS — BP 126/73 | HR 60 | Temp 96.8°F | Ht >= 80 in | Wt 328.0 lb

## 2017-03-01 DIAGNOSIS — M25561 Pain in right knee: Secondary | ICD-10-CM | POA: Diagnosis not present

## 2017-03-01 DIAGNOSIS — F411 Generalized anxiety disorder: Secondary | ICD-10-CM

## 2017-03-01 DIAGNOSIS — L84 Corns and callosities: Secondary | ICD-10-CM | POA: Diagnosis not present

## 2017-03-01 DIAGNOSIS — H6123 Impacted cerumen, bilateral: Secondary | ICD-10-CM

## 2017-03-01 DIAGNOSIS — G8929 Other chronic pain: Secondary | ICD-10-CM

## 2017-03-01 DIAGNOSIS — Z952 Presence of prosthetic heart valve: Secondary | ICD-10-CM

## 2017-03-01 DIAGNOSIS — Q87410 Marfan's syndrome with aortic dilation: Secondary | ICD-10-CM | POA: Diagnosis not present

## 2017-03-01 DIAGNOSIS — I10 Essential (primary) hypertension: Secondary | ICD-10-CM

## 2017-03-01 DIAGNOSIS — E785 Hyperlipidemia, unspecified: Secondary | ICD-10-CM

## 2017-03-01 MED ORDER — AMLODIPINE BESYLATE 5 MG PO TABS
ORAL_TABLET | ORAL | 1 refills | Status: DC
Start: 2017-03-01 — End: 2017-10-28

## 2017-03-01 MED ORDER — CITALOPRAM HYDROBROMIDE 20 MG PO TABS: 20.0000 mg | ORAL_TABLET | Freq: Two times a day (BID) | ORAL | 3 refills | Status: DC

## 2017-03-01 MED ORDER — METOPROLOL TARTRATE 100 MG PO TABS: 100.0000 mg | ORAL_TABLET | Freq: Two times a day (BID) | ORAL | 3 refills | Status: DC

## 2017-03-01 MED ORDER — ROSUVASTATIN CALCIUM 40 MG PO TABS: 40.0000 mg | ORAL_TABLET | Freq: Every day | ORAL | 3 refills | Status: DC

## 2017-03-01 NOTE — Progress Notes (Addendum)
Subjective:    Patient ID: Carl Bruce, male    DOB: 11/17/1975, 42 y.o.   MRN: [id number]  HPI   Carl Bruce is here today forCPE and  follow up of chronic medical problem.  Outpatient Encounter Medications as of 03/01/2017  Medication Sig  . amLODipine (NORVASC) 5 MG tablet Take 2 tabs daily  . citalopram (CELEXA) 20 MG tablet Take 1 tablet (20 mg total) 2 (two) times daily by mouth.  . metoprolol tartrate (LOPRESSOR) 100 MG tablet Take 1 tablet (100 mg total) 2 (two) times daily by mouth.  . rosuvastatin (CRESTOR) 40 MG tablet Take 1 tablet (40 mg total) daily by mouth.     1. Essential hypertension  No c/o chest pain,  or headache. Wife checks blood pressure at home which is always in normal range. BP Readings from Last 3 Encounters:  09/18/16 105/60  07/18/16 110/66  01/18/16 124/71     2. Anxiety state  He is currrently on celexa daily  3. 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 He is currently on lopressor. He is not able to work. He gets very fatigued and short of breath with activity and has toi rest frequently. Saw cardiology in august.  4.      hyperlipidemia          Does not watch diet.    New complaints: - still having right knee pain and needs referral back to ortho. - Having lots of problems with his feet- callus, cracks and pain- needs referral to podiatrist.  Social history: Is unable to work due to his heart.   Review of Systems  Constitutional: Negative.   HENT: Negative.   Respiratory: Positive for shortness of breath.   Cardiovascular: Negative for chest pain, palpitations and leg swelling.  Genitourinary: Negative.   Musculoskeletal: Positive for arthralgias (right knee).  Neurological: Negative.   Psychiatric/Behavioral: Negative.   All other systems reviewed and are negative.      Objective:   Physical Exam  Constitutional: He is oriented to person, place, and  time. He appears well-developed and well-nourished.  HENT:  Head: Normocephalic.  Right Ear: External ear normal.  Left Ear: External ear normal.  Nose: Nose normal.  Mouth/Throat: Oropharynx is clear and moist.  Bil cerumen impaction  Eyes: EOM are normal. Pupils are equal, round, and reactive to light.  Neck: Normal range of motion. Neck supple. No JVD present. No thyromegaly present.  Cardiovascular: Normal rate, regular rhythm, normal heart sounds and intact distal pulses. Exam reveals no gallop and no friction rub.  No murmur heard. Pulmonary/Chest: Effort normal and breath sounds normal. No respiratory distress. He has no wheezes. He has no rales. He exhibits no tenderness.  Abdominal: Soft. Bowel sounds are normal. He exhibits no mass. There is no tenderness.  Musculoskeletal: Normal range of motion. He exhibits no edema.  Mild right knee effusion. Crepitus on flexion and extension No patella tenderness  Lymphadenopathy:    He has no cervical adenopathy.  Neurological: He is alert and oriented to person, place, and time. No cranial nerve deficit.  Skin: Skin is warm and dry.  Callus and fissures bil feet  Psychiatric: He has a normal mood and affect. His behavior is normal. Judgment and thought content normal.   BP 126/73   Pulse 60   Temp (!) 96.8 F (36 C) (Oral)   Ht 7' (2.134 m)   Wt (!) 328  lb (148.8 kg)   BMI 32.68 kg/m   S/p ear lavage- TM's clear bil    Assessment & Plan:  1. Essential hypertension Low sodium diet - CMP14+EGFR - amLODipine (NORVASC) 5 MG tablet; Take 2 tabs daily  Dispense: 180 tablet; Refill: 1  2. Anxiety state Stress management - citalopram (CELEXA) 20 MG tablet; Take 1 tablet (20 mg total) by mouth 2 (two) times daily.  Dispense: 180 tablet; Refill: 3  3. Marfan's syndrome with aortic dilation Keep follow up with specialist  4. Hyperlipidemia, unspecified hyperlipidemia type Low fat diet - Lipid panel - rosuvastatin (CRESTOR) 40  MG tablet; Take 1 tablet (40 mg total) by mouth daily.  Dispense: 90 tablet; Refill: 3  5. History of aortic valve replacement Yearly follow ups with cardiology - metoprolol tartrate (LOPRESSOR) 100 MG tablet; Take 1 tablet (100 mg total) by mouth 2 (two) times daily.  Dispense: 180 tablet; Refill: 3  6 .right knee pain - referral ortho  7. Foot callus -referral to podiatyr  Labs pending Health maintenance reviewed Diet and exercise encouraged Continue all meds Follow up  In 6 months   [place], FNP

## 2017-03-01 NOTE — Addendum Note (Signed)
Addended by: Bennie PieriniMARTIN, MARY-MARGARET on: 03/01/2017 09:41 AM   Modules accepted: Orders

## 2017-03-01 NOTE — Patient Instructions (Signed)

## 2017-03-02 LAB — CMP14+EGFR
ALK PHOS: 72 IU/L (ref 39–117)
ALT: 40 IU/L (ref 0–44)
AST: 29 IU/L (ref 0–40)
Albumin/Globulin Ratio: 2.1 (ref 1.2–2.2)
Albumin: 4.7 g/dL (ref 3.5–5.5)
BUN / CREAT RATIO: 11 (ref 9–20)
BUN: 12 mg/dL (ref 6–24)
Bilirubin Total: 0.5 mg/dL (ref 0.0–1.2)
CO2: 27 mmol/L (ref 20–29)
CREATININE: 1.08 mg/dL (ref 0.76–1.27)
Calcium: 9.7 mg/dL (ref 8.7–10.2)
Chloride: 99 mmol/L (ref 96–106)
GFR calc Af Amer: 99 mL/min/{1.73_m2} (ref >59)
GFR, EST NON AFRICAN AMERICAN: 85 mL/min/{1.73_m2} (ref >59)
GLOBULIN, TOTAL: 2.2 g/dL (ref 1.5–4.5)
Glucose: 102 mg/dL — ABNORMAL HIGH (ref 65–99)
Potassium: 4.8 mmol/L (ref 3.5–5.2)
Sodium: 141 mmol/L (ref 134–144)
Total Protein: 6.9 g/dL (ref 6.0–8.5)

## 2017-03-02 LAB — LIPID PANEL
CHOL/HDL RATIO: 3.7 ratio (ref 0.0–5.0)
CHOLESTEROL TOTAL: 136 mg/dL (ref 100–199)
HDL: 37 mg/dL — ABNORMAL LOW (ref >39)
LDL CALC: 55 mg/dL (ref 0–99)
Triglycerides: 220 mg/dL — ABNORMAL HIGH (ref 0–149)
VLDL Cholesterol Cal: 44 mg/dL — ABNORMAL HIGH (ref 5–40)

## 2017-03-05 MED FILL — ROSUVASTATIN CALCIUM 40 MG: 40 | 90 days supply | Qty: 90 | Fill #1

## 2017-03-08 MED FILL — SHIPPING COST: 1 days supply | Qty: 1 | Fill #0

## 2017-04-03 ENCOUNTER — Other Ambulatory Visit: Payer: Self-pay | Admitting: *Deleted

## 2017-04-09 DIAGNOSIS — M25561 Pain in right knee: Secondary | ICD-10-CM | POA: Diagnosis not present

## 2017-04-17 MED FILL — METOPROLOL TARTRATE 100 MG: 100 | 90 days supply | Qty: 180 | Fill #1

## 2017-04-17 MED FILL — CITALOPRAM HBR 20 MG TABLET: 20 | 90 days supply | Qty: 180 | Fill #1

## 2017-04-19 MED FILL — SHIPPING COST: 1 days supply | Qty: 1 | Fill #1

## 2017-05-13 MED FILL — AMLODIPINE BESYLATE 5 MG TA: 5 | 90 days supply | Qty: 180 | Fill #0

## 2017-05-13 MED FILL — SHIPPING COST: 1 days supply | Qty: 1 | Fill #2

## 2017-06-06 MED FILL — ROSUVASTATIN CALCIUM 40 MG: 40 | 90 days supply | Qty: 90 | Fill #2

## 2017-06-13 MED FILL — SHIPPING COST: 1 days supply | Qty: 1 | Fill #3

## 2017-06-28 ENCOUNTER — Ambulatory Visit: Payer: 59 | Admitting: Family Medicine

## 2017-07-20 ENCOUNTER — Other Ambulatory Visit: Payer: Self-pay | Admitting: *Deleted

## 2017-07-20 DIAGNOSIS — F411 Generalized anxiety disorder: Secondary | ICD-10-CM

## 2017-07-20 MED ORDER — CITALOPRAM HYDROBROMIDE 20 MG PO TABS: 20.0000 mg | ORAL_TABLET | Freq: Two times a day (BID) | ORAL | 3 refills | Status: DC

## 2017-08-01 MED FILL — SHIPPING COST: 1 days supply | Qty: 1 | Fill #4

## 2017-08-01 MED FILL — CITALOPRAM HBR 20 MG TABLET: 20 | 90 days supply | Qty: 180 | Fill #2

## 2017-08-01 MED FILL — AMLODIPINE BESYLATE 5 MG TA: 5 | 90 days supply | Qty: 180 | Fill #1

## 2017-08-01 MED FILL — METOPROLOL TARTRATE 100 MG: 100 | 90 days supply | Qty: 180 | Fill #2

## 2017-08-03 ENCOUNTER — Ambulatory Visit (INDEPENDENT_AMBULATORY_CARE_PROVIDER_SITE_OTHER): Payer: 59 | Admitting: Nurse Practitioner

## 2017-08-03 ENCOUNTER — Encounter: Payer: Self-pay | Admitting: Nurse Practitioner

## 2017-08-03 VITALS — BP 111/72 | HR 52 | Temp 97.6°F | Ht >= 80 in | Wt 313.0 lb

## 2017-08-03 DIAGNOSIS — S91109A Unspecified open wound of unspecified toe(s) without damage to nail, initial encounter: Secondary | ICD-10-CM

## 2017-08-03 DIAGNOSIS — S91102A Unspecified open wound of left great toe without damage to nail, initial encounter: Secondary | ICD-10-CM | POA: Diagnosis not present

## 2017-08-03 MED ORDER — SULFAMETHOXAZOLE-TRIMETHOPRIM 800-160 MG PO TABS: 1.0000 | ORAL_TABLET | Freq: Two times a day (BID) | ORAL | 0 refills | Status: DC

## 2017-08-03 NOTE — Progress Notes (Signed)
   Subjective:    Patient ID: Carl Bruce, male    DOB: 24-Jul-1975, 42 y.o.   MRN: [id number]   Chief Complaint: Cut area to left toe   HPI Patient is brought in by his wife today with c/o wound under left great toe. Has happened several times in past.  Has not completely healed from last time it occurred.   Review of Systems  Constitutional: Negative.   Respiratory: Negative.   Cardiovascular: Negative.   Skin: Positive for wound (left great toe).  All other systems reviewed and are negative.      Objective:   Physical Exam  Constitutional: He appears well-developed and well-nourished.  Cardiovascular: Normal rate.  Pulmonary/Chest: Effort normal.  Neurological: He is alert.  Skin: Skin is warm.  Callus on under surface of left great toe with open fissure  Psychiatric: He has a normal mood and affect. His behavior is normal. Judgment and thought content normal.  Nursing note and vitals reviewed.    BP 111/72   Pulse (!) 52   Temp 97.6 F (36.4 C) (Oral)   Ht 7' (2.134 m)   Wt (!) 313 lb (142 kg)   BMI 31.19 kg/m         Assessment & Plan:  Carl Bruce in today with chief complaint of Cut area to left toe   1. Open wound of toe, initial encounter Referral to wound care Soak in epsom salt bid Keep clean and dry  Meds ordered this encounter  Medications  . sulfamethoxazole-trimethoprim (BACTRIM DS) 800-160 MG tablet    Sig: Take 1 tablet by mouth 2 (two) times daily.    Dispense:  20 tablet    Refill:  0    Order Specific Question:   Supervising Provider    Answer:   Johna SheriffVINCENT, CAROL L [[id number]]   Mary-Margaret Daphine DeutscherMartin, FNP

## 2017-08-15 DIAGNOSIS — S91102A Unspecified open wound of left great toe without damage to nail, initial encounter: Secondary | ICD-10-CM | POA: Diagnosis not present

## 2017-08-15 DIAGNOSIS — S91112S Laceration without foreign body of left great toe without damage to nail, sequela: Secondary | ICD-10-CM | POA: Diagnosis not present

## 2017-08-16 DIAGNOSIS — L97528 Non-pressure chronic ulcer of other part of left foot with other specified severity: Secondary | ICD-10-CM | POA: Diagnosis not present

## 2017-08-16 DIAGNOSIS — L84 Corns and callosities: Secondary | ICD-10-CM | POA: Diagnosis not present

## 2017-09-05 DIAGNOSIS — L84 Corns and callosities: Secondary | ICD-10-CM | POA: Diagnosis not present

## 2017-09-05 DIAGNOSIS — L97516 Non-pressure chronic ulcer of other part of right foot with bone involvement without evidence of necrosis: Secondary | ICD-10-CM | POA: Diagnosis not present

## 2017-09-05 DIAGNOSIS — L97528 Non-pressure chronic ulcer of other part of left foot with other specified severity: Secondary | ICD-10-CM | POA: Diagnosis not present

## 2017-09-12 DIAGNOSIS — L84 Corns and callosities: Secondary | ICD-10-CM | POA: Diagnosis not present

## 2017-09-12 DIAGNOSIS — L97529 Non-pressure chronic ulcer of other part of left foot with unspecified severity: Secondary | ICD-10-CM | POA: Diagnosis not present

## 2017-09-12 DIAGNOSIS — L97528 Non-pressure chronic ulcer of other part of left foot with other specified severity: Secondary | ICD-10-CM | POA: Diagnosis not present

## 2017-10-22 ENCOUNTER — Other Ambulatory Visit: Payer: Self-pay | Admitting: Family Medicine

## 2017-10-22 ENCOUNTER — Ambulatory Visit (INDEPENDENT_AMBULATORY_CARE_PROVIDER_SITE_OTHER): Payer: 59 | Admitting: Family Medicine

## 2017-10-22 ENCOUNTER — Ambulatory Visit (INDEPENDENT_AMBULATORY_CARE_PROVIDER_SITE_OTHER): Payer: 59

## 2017-10-22 VITALS — BP 126/79 | HR 57 | Ht >= 80 in | Wt 319.0 lb

## 2017-10-22 DIAGNOSIS — R079 Chest pain, unspecified: Secondary | ICD-10-CM

## 2017-10-22 DIAGNOSIS — M25511 Pain in right shoulder: Secondary | ICD-10-CM

## 2017-10-22 DIAGNOSIS — G8929 Other chronic pain: Secondary | ICD-10-CM | POA: Diagnosis not present

## 2017-10-22 IMAGING — DX DG CHEST 2V
3 series · 3 of 3 positions shown · non-contrast
Comparison: [DATE].

CLINICAL DATA: Chest pain.

EXAM:
CHEST - 2 VIEW

[chest pa]
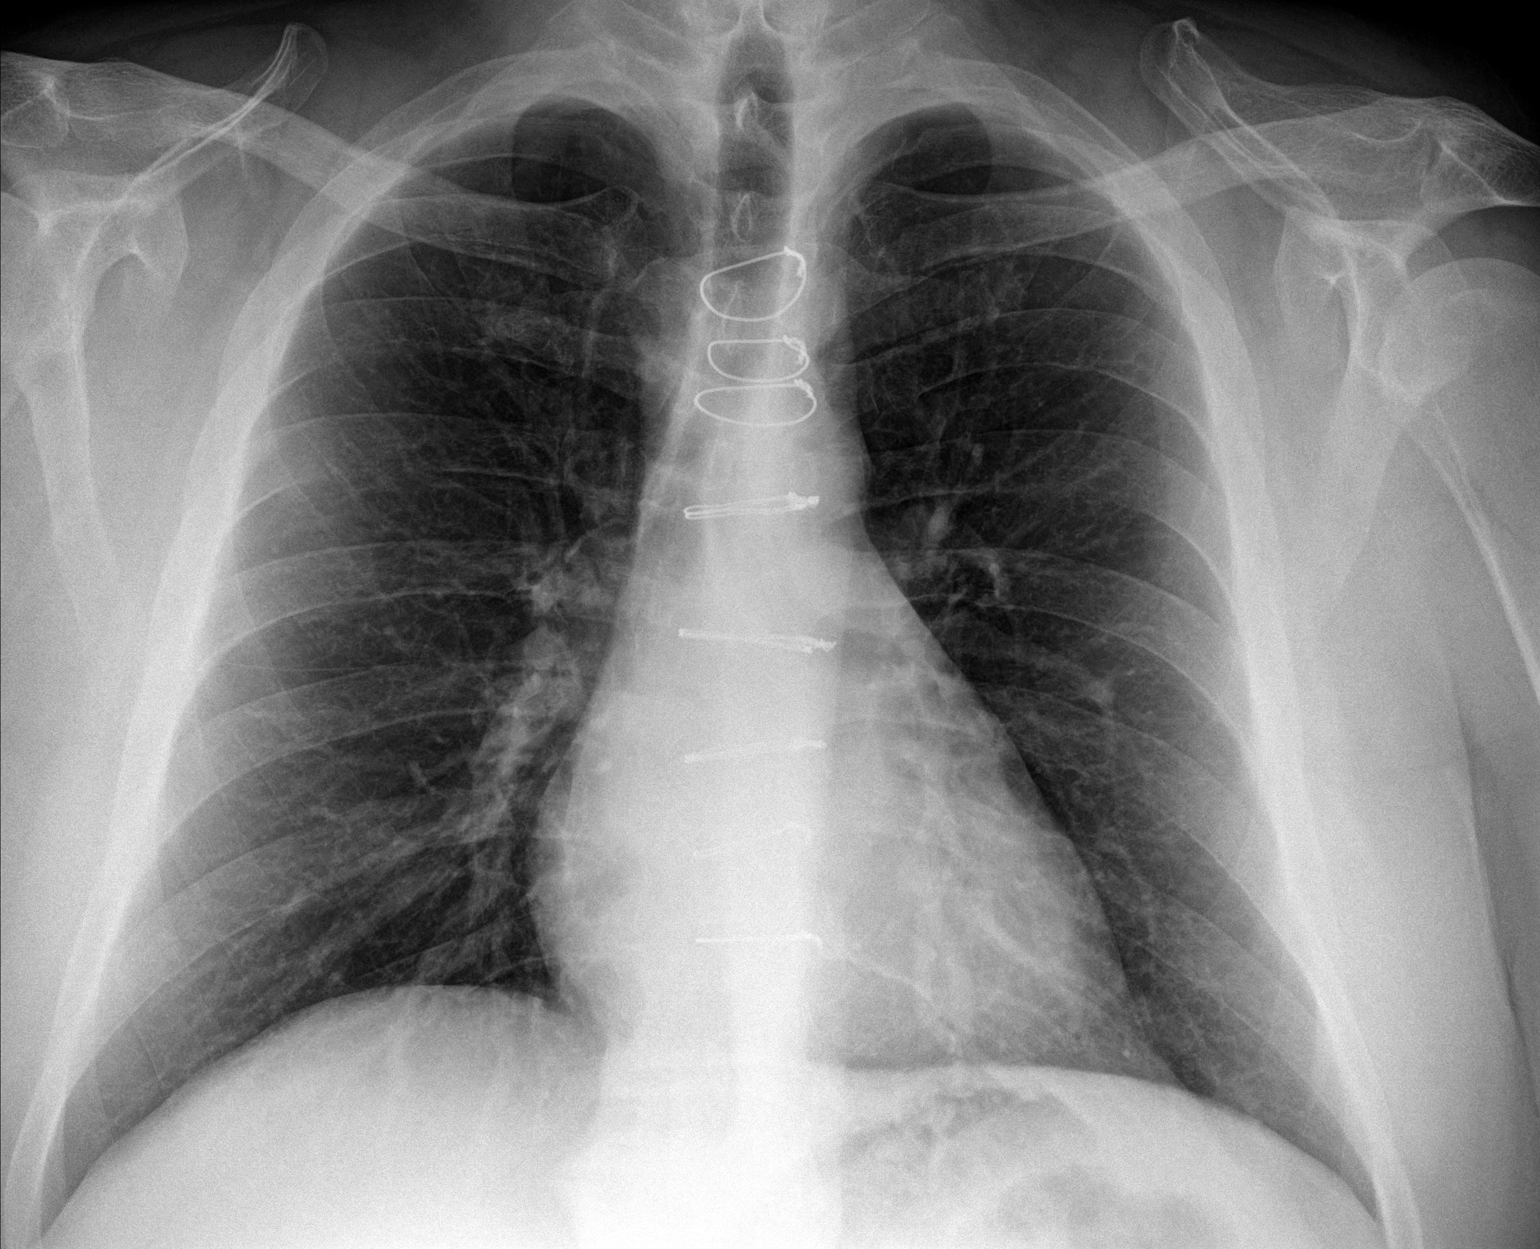

[chest lat (1 of 2)]
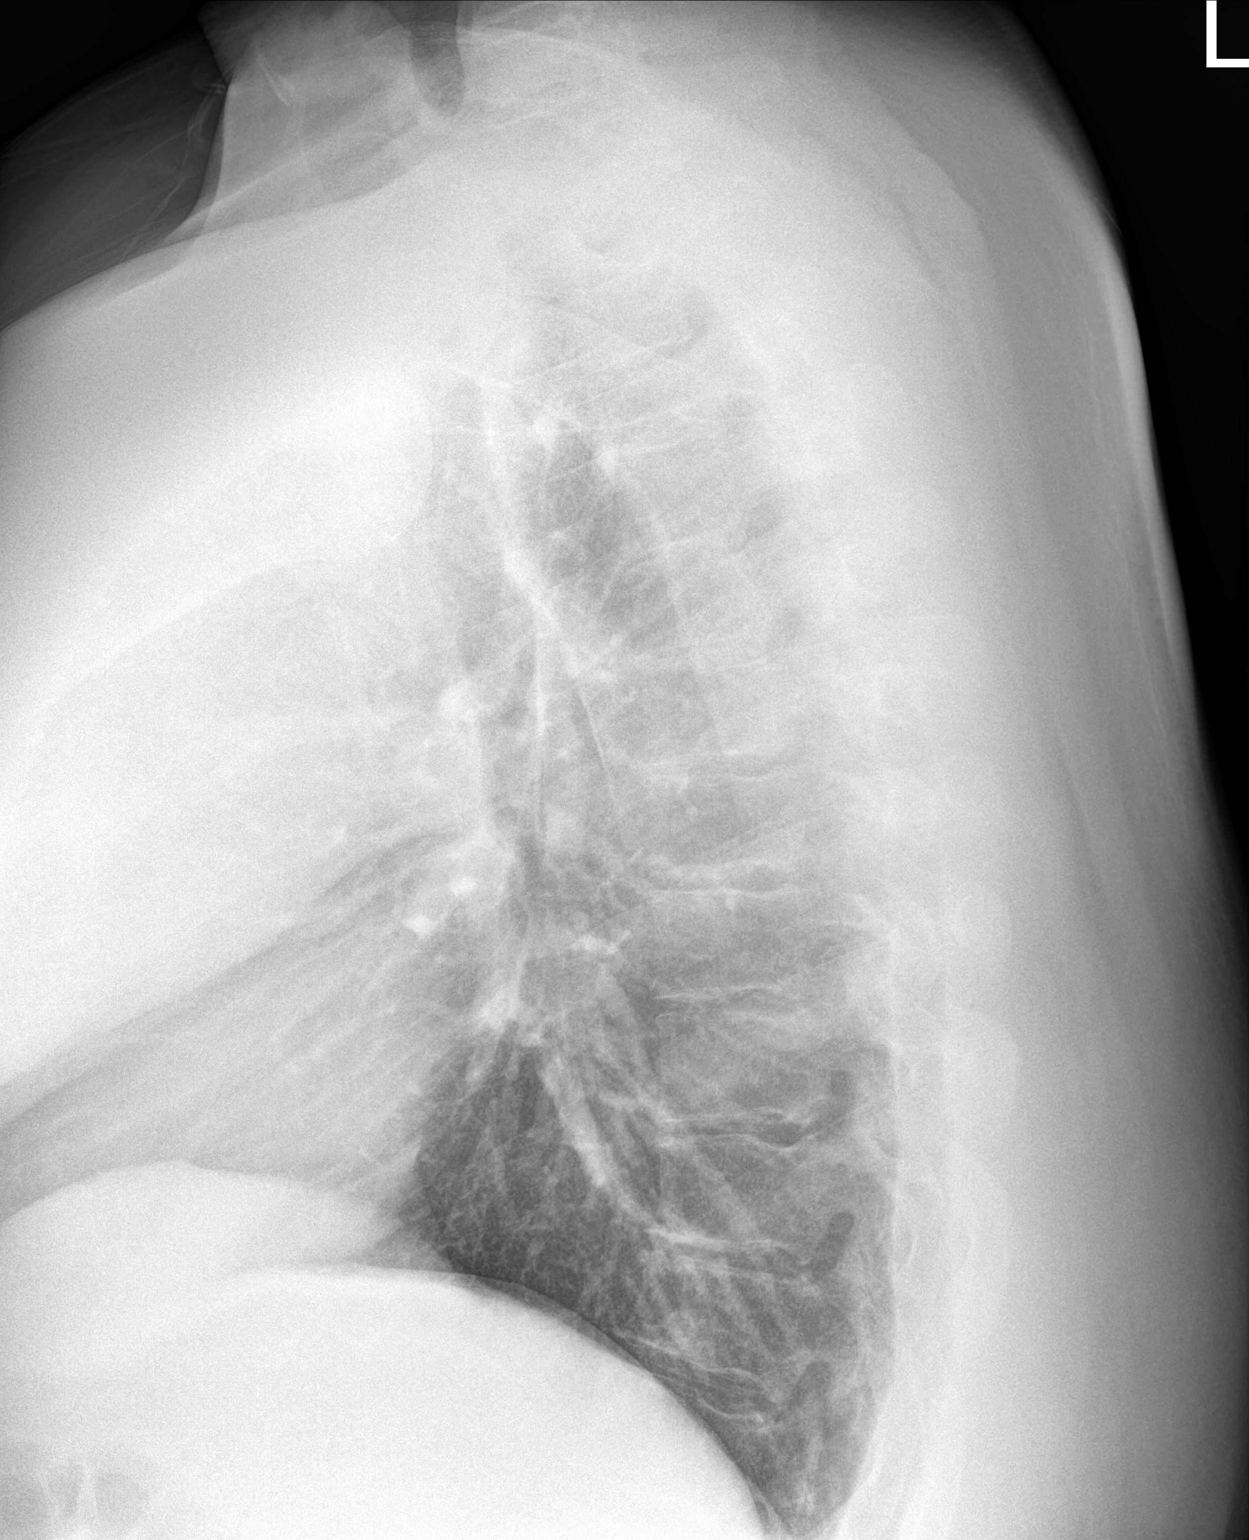

[chest lat (2 of 2)]
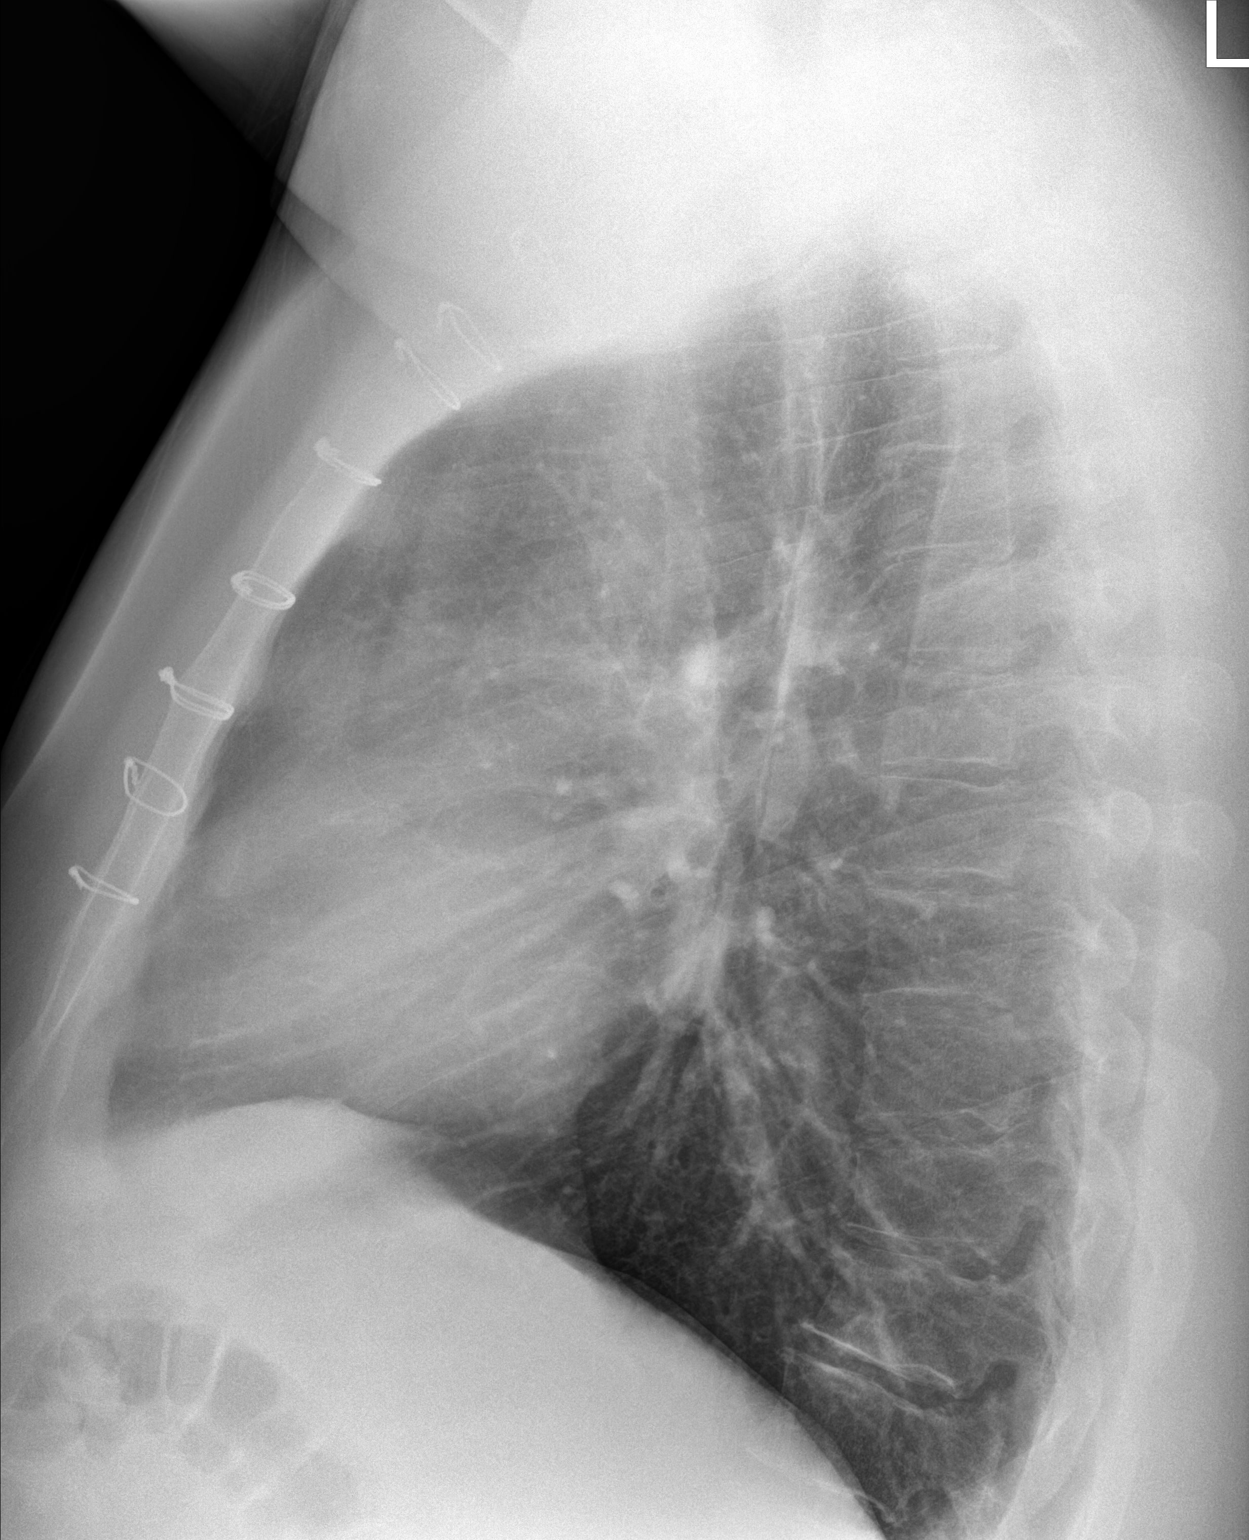

[3 of 3 positions shown; findings below may reference images not displayed]

FINDINGS: Prior median sternotomy. Heart size normal. No pulmonary venous
congestion. No focal infiltrate. No pleural effusion or
pneumothorax. No acute bony abnormality.
IMPRESSION: 1.  Prior median sternotomy.  Heart size normal.

2.  No acute pulmonary disease.

## 2017-10-22 NOTE — Progress Notes (Signed)
Subjective: CC: chest pain PCP: Bennie Pierini, FNP Carl Bruce is a 42 y.o. male presenting to clinic today for:  1. Chest pain Patient reports a history of intermittent right-sided chest pain that seems worse with elevating the right upper extremity or rolling over onto the right shoulder.  He notes that the chest pain radiates from the anterior right shoulder up to the neck.  Denies any recent injury.  He notes that he has had a history of right shoulder dislocation and injury as a child.  He is needed corticosteroid injections into the right shoulder previously for weakness and pain on that side.  He denies any associated nausea, vomiting, diaphoresis or difficulty breathing.  No hemoptysis.  Past medical history is significant for Marfan syndrome with surgical history of aortic repair.  He is followed by a cardiologist outside of the [facility] system.  Reports recent echocardiogram was good.  No recent stress testing performed.   ROS: Per HPI  Allergies  Allergen Reactions  . Penicillins Other (See Comments)    Unknown reaction as child   Past Medical History:  Diagnosis Date  . Chronic pain of right knee   . History of cellulitis 12/2015   Left lower leg  . History of stomach ulcers 2014  . Hyperlipidemia   . Hypertension   . IFG (impaired fasting glucose) 05/2014; 11/2014   A1C 5.6% 05/2014  . Marfan syndrome    Dx'd approx age 82  . Seasonal allergic rhinitis   . Tobacco abuse    hx of: quit approx 2012    Current Outpatient Medications:  .  amLODipine (NORVASC) 5 MG tablet, Take 2 tabs daily, Disp: 180 tablet, Rfl: 1 .  citalopram (CELEXA) 20 MG tablet, Take 1 tablet (20 mg total) by mouth 2 (two) times daily., Disp: 30 tablet, Rfl: 3 .  metoprolol tartrate (LOPRESSOR) 100 MG tablet, Take 1 tablet (100 mg total) by mouth 2 (two) times daily., Disp: 180 tablet, Rfl: 3 .  rosuvastatin (CRESTOR) 40 MG tablet, Take 1 tablet (40 mg total) by mouth daily., Disp:  90 tablet, Rfl: 3 .  sulfamethoxazole-trimethoprim (BACTRIM DS) 800-160 MG tablet, Take 1 tablet by mouth 2 (two) times daily., Disp: 20 tablet, Rfl: 0 Social History   Socioeconomic History  . Marital status: Married    Spouse name: Angelica Chessman  . Number of children: 1  . Years of education: College  . Highest education level: Not on file  Occupational History    Employer: OTHER    Comment: n/a  Social Needs  . Financial resource strain: Not on file  . Food insecurity:    Worry: Not on file    Inability: Not on file  . Transportation needs:    Medical: Not on file    Non-medical: Not on file  Tobacco Use  . Smoking status: Former Smoker    Types: Cigarettes    Last attempt to quit: 11/10/2010    Years since quitting: 6.9  . Smokeless tobacco: Never Used  Substance and Sexual Activity  . Alcohol use: Yes    Alcohol/week: 0.0 standard drinks    Comment: occassionally  . Drug use: No  . Sexual activity: Not on file  Lifestyle  . Physical activity:    Days per week: Not on file    Minutes per session: Not on file  . Stress: Not on file  Relationships  . Social connections:    Talks on phone: Not on file    Gets  together: Not on file    Attends religious service: Not on file    Active member of club or organization: Not on file    Attends meetings of clubs or organizations: Not on file    Relationship status: Not on file  . Intimate partner violence:    Fear of current or ex partner: Not on file    Emotionally abused: Not on file    Physically abused: Not on file    Forced sexual activity: Not on file  Other Topics Concern  . Not on file  Social History Narrative   Married, one biologic child, 2 step children.   [place] from [place].   Occupation: was Personnel officerelectrician.  Unable to work as of 2013 but not officially "disabled" per pt report.   No tob/alc/drugs.   Family History  Problem Relation Age of Onset  . Sudden death Mother   . Alcohol abuse Father   . Hyperlipidemia  Father   . Hypertension Father   . Mental illness Father   . Allergies Son   . Diabetes Maternal Grandmother   . Diabetes Paternal Grandmother   . Diabetes Paternal Grandfather     Objective: Office vital signs reviewed. BP 126/79   Pulse (!) 57   Ht 7' (2.134 m)   Wt (!) 319 lb (144.7 kg)   BMI 31.79 kg/m   Physical Examination:  General: Awake, alert, well nourished, No acute distress HEENT: Normal, MMM Cardio: regular rate and rhythm, S1S2 heard, no murmurs appreciated Pulm: clear to auscultation bilaterally, no wheezes, rhonchi or rales; normal work of breathing on room air Extremities: warm, well perfused, No edema, cyanosis or clubbing; +2 pulses bilaterally MSK: normal gait and station  RUE: Patient has full active range of motion but does have pain with pronounced abduction of the shoulder.  No tenderness to palpation to the shoulder girdle but does have tenderness to palpation over the trapezius on the right.  He has a positive Hawkins test.  He has a negative empty can test. Neuro: 5/5 UE Strength and light touch sensation grossly intact  Dg Chest 2 View  Result Date: 10/22/2017 CLINICAL DATA:  Chest pain. EXAM: CHEST - 2 VIEW COMPARISON:  10/02/2013. FINDINGS: Prior median sternotomy. Heart size normal. No pulmonary venous congestion. No focal infiltrate. No pleural effusion or pneumothorax. No acute bony abnormality. IMPRESSION: 1.  Prior median sternotomy.  Heart size normal. 2.  No acute pulmonary disease. Electronically Signed   By: Maisie Fushomas  Register   On: 10/22/2017 16:34   Assessment/ Plan: 42 y.o. male   1. Chronic right shoulder pain Acute on chronic.  We discussed options including corticosteroid injection, oral prednisone, oral NSAIDs or conservative care with ice and heat.  Patient prefers to perform nonpharmacologic interventions at this time.  Home exercise program for shoulder subluxation also provided.  More information regarding shoulder impingement also  given.  Reasons for return discussed.  He will follow-up PRN.  2. Chest pain, unspecified type Right-sided and seems to be musculoskeletal in nature.  It is always associated with right upper extremity movements.  No red flag symptoms.  Chest x-ray and EKG were obtained given history of Marfan syndrome and previous aortic surgery.  No acute abnormalities noted on chest x-ray.  EKG was consistent with the 2015 EKG.  No changes.  We discussed reasons for emergent evaluation the emergency department.  Patient was good understanding.  He will otherwise follow-up with his cardiologist for routine care. - EKG 12-Lead  Orders Placed This Encounter  Procedures  . EKG 12-Lead   No orders of the defined types were placed in this encounter.    Raliegh Ip, DO [facility] [phone]

## 2017-10-22 NOTE — Patient Instructions (Addendum)
Your checks x-ray did not demonstrate any acute abnormalities.  Your EKG is consistent with your EKG in 2015.  Based on your exam, I think that your symptoms are muscular skeletal in nature.  We discussed use of ice and heat.  If you change your mind and want to use an oral NSAID, contact me and I will send one in.  Should you decide that she want a corticosteroid injection to the shoulder, contact me and we can arrange this.  Otherwise, follow-up with your cardiologist as scheduled for routine care.  If you develop any worrisome symptoms or signs, please seek immediate medical attention emergency department.  Shoulder Impingement Syndrome Shoulder impingement syndrome is a condition that causes pain when connective tissues (tendons) surrounding the shoulder joint become pinched. These tendons are part of the group of muscles and tissues that help to stabilize the shoulder (rotator cuff). Beneath the rotator cuff is a fluid-filled sac (bursa) that allows the muscles and tendons to glide smoothly. The bursa may become swollen or irritated (bursitis). Bursitis, swelling in the rotator cuff tendons, or both conditions can decrease how much space is under a bone in the shoulder joint (acromion), resulting in impingement. What are the causes? Shoulder impingement syndrome can be caused by bursitis or swelling of the rotator cuff tendons, which may result from:  Repetitive overhead arm movements.  Falling onto the shoulder.  Weakness in the shoulder muscles.  What increases the risk? You may be more likely to develop this condition if you are an athlete who participates in:  Sports that involve throwing, such as baseball.  Tennis.  Swimming.  Volleyball.  Some people are also more likely to develop impingement syndrome because of the shape of their acromion bone. What are the signs or symptoms? The main symptom of this condition is pain on the front or side of the shoulder. Pain may:  Get worse  when lifting or raising the arm.  Get worse at night.  Wake you up from sleeping.  Feel sharp when the shoulder is moved, and then fade to an ache.  Other signs and symptoms may include:  Tenderness.  Stiffness.  Inability to raise the arm above shoulder level or behind the body.  Weakness.  How is this diagnosed? This condition may be diagnosed based on:  Your symptoms.  Your medical history.  A physical exam.  Imaging tests, such as: ? X-rays. ? MRI. ? Ultrasound.  How is this treated? Treatment for this condition may include:  Resting your shoulder and avoiding all activities that cause pain or put stress on the shoulder.  Icing your shoulder.  NSAIDs to help reduce pain and swelling.  One or more injections of medicines to numb the area and reduce inflammation.  Physical therapy.  Surgery. This may be needed if nonsurgical treatments have not helped. Surgery may involve repairing the rotator cuff, reshaping the acromion, or removing the bursa.  Follow these instructions at home: Managing pain, stiffness, and swelling  If directed, apply ice to the injured area. ? Put ice in a plastic bag. ? Place a towel between your skin and the bag. ? Leave the ice on for 20 minutes, 2-3 times a day. Activity  Rest and return to your normal activities as told by your health care provider. Ask your health care provider what activities are safe for you.  Do exercises as told by your health care provider. General instructions  Do not use any tobacco products, including cigarettes, chewing tobacco,  or e-cigarettes. Tobacco can delay healing. If you need help quitting, ask your health care provider.  Ask your health care provider when it is safe for you to drive.  Take over-the-counter and prescription medicines only as told by your health care provider.  Keep all follow-up visits as told by your health care provider. This is important. How is this  prevented?  Give your body time to rest between periods of activity.  Be safe and responsible while being active to avoid falls.  Maintain physical fitness, including strength and flexibility. Contact a health care provider if:  Your symptoms have not improved after 1-2 months of treatment and rest.  You cannot lift your arm away from your body. This information is not intended to replace advice given to you by your health care provider. Make sure you discuss any questions you have with your health care provider. Document Released: 01/22/2005 Document Revised: 09/29/2015 Document Reviewed: 12/25/2014 Elsevier Interactive Patient Education  [employer].

## 2017-10-28 ENCOUNTER — Other Ambulatory Visit: Payer: Self-pay | Admitting: Nurse Practitioner

## 2017-10-28 DIAGNOSIS — I10 Essential (primary) hypertension: Secondary | ICD-10-CM

## 2017-10-28 MED FILL — AMLODIPINE BESYLATE 5 MG TA: 5 | 90 days supply | Qty: 180 | Fill #0

## 2017-10-28 MED FILL — ROSUVASTATIN CALCIUM 40 MG: 40 | 90 days supply | Qty: 90 | Fill #3

## 2017-10-28 MED FILL — METOPROLOL TARTRATE 100 MG: 100 | 90 days supply | Qty: 180 | Fill #3

## 2017-10-28 MED FILL — SHIPPING COST: 1 days supply | Qty: 1 | Fill #5

## 2017-10-28 MED FILL — CITALOPRAM HBR 20 MG TABLET: 20 | 90 days supply | Qty: 180 | Fill #3

## 2017-11-11 ENCOUNTER — Ambulatory Visit (INDEPENDENT_AMBULATORY_CARE_PROVIDER_SITE_OTHER): Payer: 59 | Admitting: Nurse Practitioner

## 2017-11-11 ENCOUNTER — Encounter: Payer: Self-pay | Admitting: Nurse Practitioner

## 2017-11-11 VITALS — BP 117/75 | HR 49 | Temp 97.0°F | Ht >= 80 in | Wt 319.0 lb

## 2017-11-11 DIAGNOSIS — M545 Low back pain, unspecified: Secondary | ICD-10-CM

## 2017-11-11 MED ORDER — CYCLOBENZAPRINE HCL 10 MG PO TABS: 10.0000 mg | ORAL_TABLET | Freq: Three times a day (TID) | ORAL | 1 refills | Status: DC | PRN

## 2017-11-11 MED ORDER — PREDNISONE 10 MG (21) PO TBPK: ORAL_TABLET | ORAL | 0 refills | Status: DC

## 2017-11-11 NOTE — Progress Notes (Signed)
   Subjective:    Patient ID: Carl Bruce, male    DOB: Jan 15, 1976, 42 y.o.   MRN: [id number]   Chief Complaint: Back Pain   HPI patient comes in today c/o of back pain. He was mopping and and he heard something pop in his back. This happened over a week ago and is no better. Rates pain 10/10 when he moves. Sitting still completely relieves pain. Pain does not radiate. He has taken ibuprofen which really has not helped   Review of Systems  Constitutional: Negative for activity change and appetite change.  HENT: Negative.   Eyes: Negative for pain.  Respiratory: Negative for shortness of breath.   Cardiovascular: Negative for chest pain, palpitations and leg swelling.  Gastrointestinal: Negative for abdominal pain.  Endocrine: Negative for polydipsia.  Genitourinary: Negative.   Musculoskeletal: Positive for back pain.  Skin: Negative for rash.  Neurological: Negative for dizziness, weakness and headaches.  Hematological: Does not bruise/bleed easily.  Psychiatric/Behavioral: Negative.   All other systems reviewed and are negative.      Objective:   Physical Exam  Constitutional: He is oriented to person, place, and time. He appears well-developed and well-nourished.  Cardiovascular: Normal rate.  Pulmonary/Chest: Effort normal.  Musculoskeletal:  derease rom of lumbar spine with pain on flexion , ext and rotation in either direction (+) SLR at 90 degress bil  Neurological: He is alert and oriented to person, place, and time.  Skin: Skin is warm and dry.  Nursing note and vitals reviewed.  BP 117/75   Pulse (!) 49   Temp (!) 97 F (36.1 C)   Ht 7' (2.134 m)   Wt (!) 319 lb (144.7 kg)   BMI 31.79 kg/m         Assessment & Plan:  Donnamarie Poag in today with chief complaint of Back Pain   1. Acute midline low back pain without sciatica Meds ordered this encounter  Medications  . predniSONE (STERAPRED UNI-PAK 21 TAB) 10 MG (21) TBPK tablet    Sig: As  directed x 6 days    Dispense:  21 tablet    Refill:  0    Order Specific Question:   Supervising Provider    Answer:   VINCENT, CAROL L [[id number]]  . cyclobenzaprine (FLEXERIL) 10 MG tablet    Sig: Take 1 tablet (10 mg total) by mouth 3 (three) times daily as needed for muscle spasms.    Dispense:  30 tablet    Refill:  1    Order Specific Question:   Supervising Provider    Answer:   VINCENT, CAROL L [[id number]]   Moist heat Rest RTO prn  Mary-Margaret Daphine Deutscher, FNP

## 2017-11-11 NOTE — Patient Instructions (Signed)

## 2018-01-31 ENCOUNTER — Other Ambulatory Visit: Payer: Self-pay | Admitting: *Deleted

## 2018-01-31 DIAGNOSIS — F411 Generalized anxiety disorder: Secondary | ICD-10-CM

## 2018-01-31 DIAGNOSIS — I10 Essential (primary) hypertension: Secondary | ICD-10-CM

## 2018-01-31 DIAGNOSIS — Z952 Presence of prosthetic heart valve: Secondary | ICD-10-CM

## 2018-01-31 DIAGNOSIS — E785 Hyperlipidemia, unspecified: Secondary | ICD-10-CM

## 2018-01-31 MED ORDER — ROSUVASTATIN CALCIUM 40 MG PO TABS: 40.0000 mg | ORAL_TABLET | Freq: Every day | ORAL | 3 refills | Status: DC

## 2018-01-31 MED ORDER — AMLODIPINE BESYLATE 5 MG PO TABS: ORAL_TABLET | ORAL | 3 refills | Status: DC

## 2018-01-31 MED ORDER — METOPROLOL TARTRATE 100 MG PO TABS: 100.0000 mg | ORAL_TABLET | Freq: Two times a day (BID) | ORAL | 3 refills | Status: DC

## 2018-01-31 MED ORDER — CITALOPRAM HYDROBROMIDE 20 MG PO TABS: 20.0000 mg | ORAL_TABLET | Freq: Two times a day (BID) | ORAL | 3 refills | Status: DC

## 2018-01-31 MED FILL — CITALOPRAM HBR 20 MG TABLET: 20 | 90 days supply | Qty: 180 | Fill #0

## 2018-01-31 MED FILL — METOPROLOL TARTRATE 100 MG: 100 | 90 days supply | Qty: 180 | Fill #0

## 2018-01-31 MED FILL — ROSUVASTATIN CALCIUM 40 MG: 40 | 90 days supply | Qty: 90 | Fill #0

## 2018-01-31 MED FILL — AMLODIPINE BESYLATE 5 MG TA: 5 | 90 days supply | Qty: 180 | Fill #0

## 2018-01-31 MED FILL — SHIPPING COST: 1 days supply | Qty: 1 | Fill #6

## 2018-03-05 ENCOUNTER — Encounter: Payer: Self-pay | Admitting: Nurse Practitioner

## 2018-03-05 ENCOUNTER — Ambulatory Visit (INDEPENDENT_AMBULATORY_CARE_PROVIDER_SITE_OTHER): Payer: No Typology Code available for payment source | Admitting: Nurse Practitioner

## 2018-03-05 VITALS — BP 125/82 | HR 86 | Temp 97.8°F | Ht >= 80 in | Wt 330.0 lb

## 2018-03-05 DIAGNOSIS — I10 Essential (primary) hypertension: Secondary | ICD-10-CM | POA: Diagnosis not present

## 2018-03-05 DIAGNOSIS — Q87410 Marfan's syndrome with aortic dilation: Secondary | ICD-10-CM

## 2018-03-05 DIAGNOSIS — Z952 Presence of prosthetic heart valve: Secondary | ICD-10-CM

## 2018-03-05 DIAGNOSIS — R234 Changes in skin texture: Secondary | ICD-10-CM

## 2018-03-05 DIAGNOSIS — E782 Mixed hyperlipidemia: Secondary | ICD-10-CM | POA: Diagnosis not present

## 2018-03-05 DIAGNOSIS — F411 Generalized anxiety disorder: Secondary | ICD-10-CM | POA: Diagnosis not present

## 2018-03-05 MED ORDER — AMLODIPINE BESYLATE 5 MG PO TABS: ORAL_TABLET | ORAL | 3 refills | Status: DC

## 2018-03-05 MED ORDER — CITALOPRAM HYDROBROMIDE 20 MG PO TABS: 20.0000 mg | ORAL_TABLET | Freq: Two times a day (BID) | ORAL | 3 refills | Status: DC

## 2018-03-05 MED ORDER — ROSUVASTATIN CALCIUM 40 MG PO TABS: 40.0000 mg | ORAL_TABLET | Freq: Every day | ORAL | 3 refills | Status: DC

## 2018-03-05 MED ORDER — METOPROLOL TARTRATE 100 MG PO TABS: 100.0000 mg | ORAL_TABLET | Freq: Two times a day (BID) | ORAL | 3 refills | Status: DC

## 2018-03-05 NOTE — Progress Notes (Signed)
Subjective:    Patient ID: Carl Bruce, male    DOB: 08/08/1975, 43 y.o.   MRN: [id number]   Chief Complaint: medical management of chronic issues  HPI:  1. Essential hypertension  No c/o chest pain , sob or headache. Does not check blood pressure at home. BP Readings from Last 3 Encounters:  11/11/17 117/75  10/22/17 126/79  08/03/17 111/72     2. Marfan's syndrome with aortic dilation  Patient has already had his aortic valve replaced as well as the ascending aorta. His cardiologist does not want him working. He has been denied for disability 2 times.  3. Anxiety state  He is on ciltalopram daily and seems to work well for him.  Depression screen [facility] 2/9 03/05/2018 08/03/2017 03/01/2017  Decreased Interest 0 0 0  Down, Depressed, Hopeless 0 0 0  PHQ - 2 Score 0 0 0  Altered sleeping - - -  Tired, decreased energy - - -  Change in appetite - - -  Feeling bad or failure about yourself  - - -  Trouble concentrating - - -  Moving slowly or fidgety/restless - - -  Suicidal thoughts - - -  PHQ-9 Score - - -  Difficult doing work/chores - - -     4. Mixed hyperlipidemia  He does not watch diet. Is not able to do any exercise   ai Outpatient Encounter Medications as of 03/05/2018  Medication Sig  . amLODipine (NORVASC) 5 MG tablet TAKE 2 TABLETS BY MOUTH DAILY  . citalopram (CELEXA) 20 MG tablet Take 1 tablet (20 mg total) by mouth 2 (two) times daily.  . cyclobenzaprine (FLEXERIL) 10 MG tablet Take 1 tablet (10 mg total) by mouth 3 (three) times daily as needed for muscle spasms.  . metoprolol tartrate (LOPRESSOR) 100 MG tablet Take 1 tablet (100 mg total) by mouth 2 (two) times daily.  . rosuvastatin (CRESTOR) 40 MG tablet Take 1 tablet (40 mg total) by mouth daily.       New complaints: He has a deep fissure in bottom of left great toe. Hurts to walk on  He has been to wound care in past.   Social history: Wife works at [employer].    Review of Systems  Constitutional: Negative.   HENT: Negative.   Respiratory: Negative.   Cardiovascular: Negative.   Genitourinary: Negative.   Neurological: Negative.   Psychiatric/Behavioral: Negative.   All other systems reviewed and are negative.      Objective:   Physical Exam Vitals signs and nursing note reviewed.  Constitutional:      General: He is not in acute distress.    Appearance: He is normal weight.  HENT:     Head: Normocephalic.     Nose: Nose normal.     Mouth/Throat:     Mouth: Mucous membranes are moist.  Eyes:     Extraocular Movements: Extraocular movements intact.     Pupils: Pupils are equal, round, and reactive to light.  Neck:     Musculoskeletal: Normal range of motion and neck supple.  Cardiovascular:     Rate and Rhythm: Normal rate and regular rhythm.     Pulses: Normal pulses.     Heart sounds: Normal heart sounds.  Pulmonary:     Effort: Pulmonary effort is normal.     Breath sounds: Normal breath sounds.  Musculoskeletal: Normal range of motion.  Lymphadenopathy:     Cervical: No cervical adenopathy.  Skin:    General: Skin is warm and dry.     Comments: Deep fissure at base of left great toe. 2-3cm deep- bloody drainage  Neurological:     General: No focal deficit present.     Mental Status: He is alert.  Psychiatric:        Mood and Affect: Mood normal.        Behavior: Behavior normal.    BP 125/82   Pulse 86   Temp 97.8 F (36.6 C) (Oral)   Ht 7' (2.134 m)   Wt (!) 330 lb (149.7 kg)   BMI 32.88 kg/m       Assessment & Plan:  CARSIN RANDAZZO comes in today with chief complaint of Medical Management of Chronic Issues   Diagnosis and orders addressed:  1. Essential hypertension Low sodium diet - amLODipine (NORVASC) 5 MG tablet; TAKE 2 TABLETS BY MOUTH DAILY  Dispense: 180 tablet; Refill: 3 - CMP14+EGFR  2. Marfan's syndrome with aortic dilation  3. Anxiety state Stress management -  citalopram (CELEXA) 20 MG tablet; Take 1 tablet (20 mg total) by mouth 2 (two) times daily.  Dispense: 180 tablet; Refill: 3  4. Mixed hyperlipidemia Low fat diet - rosuvastatin (CRESTOR) 40 MG tablet; Take 1 tablet (40 mg total) by mouth daily.  Dispense: 90 tablet; Refill: 3 - Lipid panel  5. Skin fissure Fracture shoe to keep from putting pressure on toe when walking - AMB referral to wound care center  6. History of aortic valve replacement Keep follow up with cardiology - metoprolol tartrate (LOPRESSOR) 100 MG tablet; Take 1 tablet (100 mg total) by mouth 2 (two) times daily.  Dispense: 180 tablet; Refill: 3   Labs pending Health Maintenance reviewed Diet and exercise encouraged  Follow up plan: 6 months   Mary-Margaret Hassell Done, FNP

## 2018-03-05 NOTE — Patient Instructions (Signed)

## 2018-03-14 LAB — CMP14+EGFR
ALK PHOS: 76 IU/L (ref 39–117)
ALT: 34 IU/L (ref 0–44)
AST: 25 IU/L (ref 0–40)
Albumin/Globulin Ratio: 1.8 (ref 1.2–2.2)
Albumin: 4.3 g/dL (ref 4.0–5.0)
BUN/Creatinine Ratio: 8 — ABNORMAL LOW (ref 9–20)
BUN: 9 mg/dL (ref 6–24)
Bilirubin Total: 0.3 mg/dL (ref 0.0–1.2)
CO2: 27 mmol/L (ref 20–29)
CREATININE: 1.06 mg/dL (ref 0.76–1.27)
Calcium: 9.6 mg/dL (ref 8.7–10.2)
Chloride: 101 mmol/L (ref 96–106)
GFR calc Af Amer: 100 mL/min/{1.73_m2} (ref >59)
GFR calc non Af Amer: 87 mL/min/{1.73_m2} (ref >59)
GLOBULIN, TOTAL: 2.4 g/dL (ref 1.5–4.5)
Glucose: 88 mg/dL (ref 65–99)
Potassium: 4.4 mmol/L (ref 3.5–5.2)
Sodium: 143 mmol/L (ref 134–144)
Total Protein: 6.7 g/dL (ref 6.0–8.5)

## 2018-03-14 LAB — LIPID PANEL
Chol/HDL Ratio: 3.3 ratio (ref 0.0–5.0)
Cholesterol, Total: 112 mg/dL (ref 100–199)
HDL: 34 mg/dL — ABNORMAL LOW (ref >39)
LDL Calculated: 47 mg/dL (ref 0–99)
Triglycerides: 156 mg/dL — ABNORMAL HIGH (ref 0–149)
VLDL Cholesterol Cal: 31 mg/dL (ref 5–40)

## 2018-03-17 ENCOUNTER — Other Ambulatory Visit: Payer: Self-pay | Admitting: *Deleted

## 2018-03-17 DIAGNOSIS — R351 Nocturia: Secondary | ICD-10-CM

## 2018-03-19 ENCOUNTER — Other Ambulatory Visit: Payer: Self-pay

## 2018-03-19 ENCOUNTER — Ambulatory Visit (HOSPITAL_COMMUNITY): Payer: No Typology Code available for payment source | Attending: Nurse Practitioner

## 2018-03-19 ENCOUNTER — Encounter (HOSPITAL_COMMUNITY): Payer: Self-pay

## 2018-03-19 DIAGNOSIS — S91102A Unspecified open wound of left great toe without damage to nail, initial encounter: Secondary | ICD-10-CM | POA: Insufficient documentation

## 2018-03-19 LAB — SPECIMEN STATUS REPORT

## 2018-03-19 LAB — PSA TOTAL (REFLEX TO FREE): Prostate Specific Ag, Serum: 0.5 ng/mL (ref 0.0–4.0)
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
## 2018-03-20 ENCOUNTER — Other Ambulatory Visit: Payer: Self-pay | Admitting: Nurse Practitioner

## 2018-03-20 DIAGNOSIS — R234 Changes in skin texture: Secondary | ICD-10-CM

## 2018-03-20 NOTE — Progress Notes (Unsigned)
Ref t

## 2018-03-21 ENCOUNTER — Ambulatory Visit (HOSPITAL_COMMUNITY): Payer: No Typology Code available for payment source | Admitting: Physical Therapy

## 2018-03-21 DIAGNOSIS — S91102A Unspecified open wound of left great toe without damage to nail, initial encounter: Secondary | ICD-10-CM | POA: Diagnosis not present
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
## 2018-03-24 ENCOUNTER — Ambulatory Visit (HOSPITAL_COMMUNITY): Payer: No Typology Code available for payment source | Admitting: Physical Therapy

## 2018-03-24 DIAGNOSIS — S91102A Unspecified open wound of left great toe without damage to nail, initial encounter: Secondary | ICD-10-CM
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
## 2018-03-26 ENCOUNTER — Ambulatory Visit (HOSPITAL_COMMUNITY): Payer: 59

## 2018-03-27 ENCOUNTER — Ambulatory Visit (HOSPITAL_COMMUNITY): Payer: No Typology Code available for payment source | Admitting: Physical Therapy

## 2018-03-27 DIAGNOSIS — S91102A Unspecified open wound of left great toe without damage to nail, initial encounter: Secondary | ICD-10-CM
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
## 2018-03-28 ENCOUNTER — Ambulatory Visit (HOSPITAL_COMMUNITY): Payer: 59 | Admitting: Physical Therapy

## 2018-03-31 ENCOUNTER — Ambulatory Visit (HOSPITAL_COMMUNITY): Payer: No Typology Code available for payment source | Admitting: Physical Therapy

## 2018-03-31 DIAGNOSIS — S91102A Unspecified open wound of left great toe without damage to nail, initial encounter: Secondary | ICD-10-CM | POA: Diagnosis not present
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
## 2018-04-02 ENCOUNTER — Ambulatory Visit (HOSPITAL_COMMUNITY): Payer: 59

## 2018-04-03 ENCOUNTER — Ambulatory Visit (HOSPITAL_COMMUNITY): Payer: No Typology Code available for payment source | Admitting: Physical Therapy

## 2018-04-03 DIAGNOSIS — S91102A Unspecified open wound of left great toe without damage to nail, initial encounter: Secondary | ICD-10-CM
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
## 2018-04-07 ENCOUNTER — Ambulatory Visit (HOSPITAL_COMMUNITY): Payer: No Typology Code available for payment source | Attending: Nurse Practitioner | Admitting: Physical Therapy

## 2018-04-07 DIAGNOSIS — S91102A Unspecified open wound of left great toe without damage to nail, initial encounter: Secondary | ICD-10-CM | POA: Insufficient documentation
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
## 2018-04-10 ENCOUNTER — Ambulatory Visit (HOSPITAL_COMMUNITY): Payer: No Typology Code available for payment source | Admitting: Physical Therapy

## 2018-04-10 DIAGNOSIS — S91102A Unspecified open wound of left great toe without damage to nail, initial encounter: Secondary | ICD-10-CM | POA: Diagnosis not present
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
## 2018-04-15 ENCOUNTER — Ambulatory Visit (HOSPITAL_COMMUNITY): Payer: No Typology Code available for payment source | Admitting: Physical Therapy

## 2018-04-15 DIAGNOSIS — S91102A Unspecified open wound of left great toe without damage to nail, initial encounter: Secondary | ICD-10-CM | POA: Diagnosis not present
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
## 2018-04-17 ENCOUNTER — Ambulatory Visit (HOSPITAL_COMMUNITY): Payer: No Typology Code available for payment source | Admitting: Physical Therapy

## 2018-04-17 ENCOUNTER — Other Ambulatory Visit: Payer: Self-pay

## 2018-04-17 DIAGNOSIS — S91102A Unspecified open wound of left great toe without damage to nail, initial encounter: Secondary | ICD-10-CM
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
## 2018-04-22 ENCOUNTER — Ambulatory Visit (HOSPITAL_COMMUNITY): Payer: No Typology Code available for payment source | Admitting: Physical Therapy

## 2018-04-24 ENCOUNTER — Ambulatory Visit (HOSPITAL_COMMUNITY): Payer: No Typology Code available for payment source | Admitting: Physical Therapy

## 2018-04-24 MED FILL — METOPROLOL TARTRATE 100 MG: 100 | 90 days supply | Qty: 180 | Fill #1

## 2018-04-24 MED FILL — AMLODIPINE BESYLATE 5 MG TA: 5 | 90 days supply | Qty: 180 | Fill #1

## 2018-04-24 MED FILL — ROSUVASTATIN CALCIUM 40 MG: 40 | 90 days supply | Qty: 90 | Fill #1

## 2018-04-24 MED FILL — CITALOPRAM HBR 20 MG TABLET: 20 | 90 days supply | Qty: 180 | Fill #1

## 2018-06-03 ENCOUNTER — Ambulatory Visit (INDEPENDENT_AMBULATORY_CARE_PROVIDER_SITE_OTHER): Payer: No Typology Code available for payment source | Admitting: Nurse Practitioner

## 2018-06-03 ENCOUNTER — Encounter: Payer: Self-pay | Admitting: Nurse Practitioner

## 2018-06-03 ENCOUNTER — Other Ambulatory Visit: Payer: Self-pay

## 2018-06-03 DIAGNOSIS — S91102D Unspecified open wound of left great toe without damage to nail, subsequent encounter: Secondary | ICD-10-CM

## 2018-06-03 DIAGNOSIS — S91109D Unspecified open wound of unspecified toe(s) without damage to nail, subsequent encounter: Secondary | ICD-10-CM

## 2018-06-03 MED ORDER — CIPROFLOXACIN HCL 500 MG PO TABS: 500.0000 mg | ORAL_TABLET | Freq: Two times a day (BID) | ORAL | 0 refills | Status: DC

## 2018-06-03 NOTE — Progress Notes (Signed)
Patient ID: Carl Bruce, male   DOB: December 22, 1975, 43 y.o.   MRN: [id number]    Virtual Visit via telephone Note  I connected with Carl Bruce on 06/03/18 at 10:40 AM by telephone and verified that I am speaking with the correct person using two identifiers. JAGUAR SAFKO is currently located at home and no one is currently with her during visit. The provider, Mary-Margaret Daphine Deutscher, FNP is located in their office at time of visit.  I discussed the limitations, risks, security and privacy concerns of performing an evaluation and management service by telephone and the availability of in person appointments. I also discussed with the patient that there may be a patient responsible charge related to this service. The patient expressed understanding and agreed to proceed.   History and Present Illness:   Chief Complaint: Wound Infection   HPI patient was seen last year with open wound on left big toe. He was sent to wound care and it was healing good si they discharged him and told him what to do at home. He has been doing his home treatments with a honey wrap. Wound has reopened and draining. Now the entire toe is erythematous. He says it does not hurt .     Review of Systems  Skin:       Wound on right great toe  All other systems reviewed and are negative.    Observations/Objective: See pictures below      Assessment and Plan: Carl Bruce in today with chief complaint of Wound Infection   1. Open wound of toe, subsequent encounter Meds ordered this encounter  Medications  . ciprofloxacin (CIPRO) 500 MG tablet    Sig: Take 1 tablet (500 mg total) by mouth 2 (two) times daily.    Dispense:  20 tablet    Refill:  0    Order Specific Question:   Supervising Provider    Answer:   Arville Care A [[id number]]   Soak in epsom salt daily Keep dry Wear shoe when walking to keep weight off toe  Follow Up Instructions: prn    I discussed the assessment and  treatment plan with the patient. The patient was provided an opportunity to ask questions and all were answered. The patient agreed with the plan and demonstrated an understanding of the instructions.   The patient was advised to call back or seek an in-person evaluation if the symptoms worsen or if the condition fails to improve as anticipated.  The above assessment and management plan was discussed with the patient. The patient verbalized understanding of and has agreed to the management plan. Patient is aware to call the clinic if symptoms persist or worsen. Patient is aware when to return to the clinic for a follow-up visit. Patient educated on when it is appropriate to go to the emergency department.    I provided 8 minutes of non-face-to-face time during this encounter.    Mary-Margaret Daphine Deutscher, FNP

## 2018-06-06 ENCOUNTER — Ambulatory Visit (HOSPITAL_COMMUNITY): Payer: No Typology Code available for payment source | Attending: Nurse Practitioner

## 2018-06-06 ENCOUNTER — Other Ambulatory Visit: Payer: Self-pay

## 2018-06-06 ENCOUNTER — Encounter (HOSPITAL_COMMUNITY): Payer: Self-pay

## 2018-06-06 DIAGNOSIS — S91102A Unspecified open wound of left great toe without damage to nail, initial encounter: Secondary | ICD-10-CM | POA: Diagnosis present

## 2018-06-06 DIAGNOSIS — S91102D Unspecified open wound of left great toe without damage to nail, subsequent encounter: Secondary | ICD-10-CM | POA: Diagnosis present
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
## 2018-06-09 ENCOUNTER — Encounter (HOSPITAL_COMMUNITY): Payer: Self-pay

## 2018-06-09 ENCOUNTER — Other Ambulatory Visit: Payer: Self-pay

## 2018-06-09 ENCOUNTER — Ambulatory Visit (HOSPITAL_COMMUNITY): Payer: No Typology Code available for payment source

## 2018-06-09 DIAGNOSIS — S91102D Unspecified open wound of left great toe without damage to nail, subsequent encounter: Secondary | ICD-10-CM

## 2018-06-09 DIAGNOSIS — S91102A Unspecified open wound of left great toe without damage to nail, initial encounter: Secondary | ICD-10-CM
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
## 2018-06-13 ENCOUNTER — Ambulatory Visit (HOSPITAL_COMMUNITY): Payer: No Typology Code available for payment source

## 2018-06-13 ENCOUNTER — Other Ambulatory Visit: Payer: Self-pay

## 2018-06-13 ENCOUNTER — Encounter (HOSPITAL_COMMUNITY): Payer: Self-pay

## 2018-06-13 DIAGNOSIS — S91102D Unspecified open wound of left great toe without damage to nail, subsequent encounter: Secondary | ICD-10-CM | POA: Diagnosis not present

## 2018-06-13 DIAGNOSIS — S91102A Unspecified open wound of left great toe without damage to nail, initial encounter: Secondary | ICD-10-CM
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
## 2018-06-16 ENCOUNTER — Other Ambulatory Visit: Payer: Self-pay

## 2018-06-16 ENCOUNTER — Encounter (HOSPITAL_COMMUNITY): Payer: Self-pay

## 2018-06-16 ENCOUNTER — Ambulatory Visit (HOSPITAL_COMMUNITY): Payer: No Typology Code available for payment source

## 2018-06-16 DIAGNOSIS — S91102D Unspecified open wound of left great toe without damage to nail, subsequent encounter: Secondary | ICD-10-CM | POA: Diagnosis not present

## 2018-06-16 DIAGNOSIS — S91102A Unspecified open wound of left great toe without damage to nail, initial encounter: Secondary | ICD-10-CM
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
## 2018-06-20 ENCOUNTER — Other Ambulatory Visit: Payer: Self-pay

## 2018-06-20 ENCOUNTER — Encounter (HOSPITAL_COMMUNITY): Payer: Self-pay

## 2018-06-20 ENCOUNTER — Ambulatory Visit (HOSPITAL_COMMUNITY): Payer: No Typology Code available for payment source

## 2018-06-20 DIAGNOSIS — S91102A Unspecified open wound of left great toe without damage to nail, initial encounter: Secondary | ICD-10-CM

## 2018-06-20 DIAGNOSIS — S91102D Unspecified open wound of left great toe without damage to nail, subsequent encounter: Secondary | ICD-10-CM | POA: Diagnosis not present
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
## 2018-06-23 ENCOUNTER — Ambulatory Visit (HOSPITAL_COMMUNITY): Payer: No Typology Code available for payment source

## 2018-06-27 ENCOUNTER — Ambulatory Visit (HOSPITAL_COMMUNITY): Payer: No Typology Code available for payment source

## 2018-07-02 ENCOUNTER — Ambulatory Visit (HOSPITAL_COMMUNITY): Payer: No Typology Code available for payment source

## 2018-07-04 ENCOUNTER — Ambulatory Visit (HOSPITAL_COMMUNITY): Payer: No Typology Code available for payment source

## 2018-07-31 MED FILL — ROSUVASTATIN CALCIUM 40 MG: 40 | 90 days supply | Qty: 90 | Fill #2

## 2018-07-31 MED FILL — AMLODIPINE BESYLATE 5 MG TA: 5 | 90 days supply | Qty: 180 | Fill #2

## 2018-07-31 MED FILL — METOPROLOL TARTRATE 100 MG: 100 | 90 days supply | Qty: 180 | Fill #2

## 2018-07-31 MED FILL — CITALOPRAM HBR 20 MG TABLET: 20 | 90 days supply | Qty: 180 | Fill #2

## 2018-10-20 ENCOUNTER — Ambulatory Visit (INDEPENDENT_AMBULATORY_CARE_PROVIDER_SITE_OTHER): Payer: No Typology Code available for payment source | Admitting: Nurse Practitioner

## 2018-10-20 ENCOUNTER — Encounter: Payer: Self-pay | Admitting: Nurse Practitioner

## 2018-10-20 ENCOUNTER — Other Ambulatory Visit: Payer: Self-pay

## 2018-10-20 DIAGNOSIS — M545 Low back pain, unspecified: Secondary | ICD-10-CM

## 2018-10-20 MED ORDER — CYCLOBENZAPRINE HCL 10 MG PO TABS: 10.0000 mg | ORAL_TABLET | Freq: Three times a day (TID) | ORAL | 1 refills | Status: DC | PRN

## 2018-10-20 MED ORDER — PREDNISONE 10 MG (21) PO TBPK: ORAL_TABLET | ORAL | 0 refills | Status: DC

## 2018-10-20 NOTE — Progress Notes (Signed)
   Virtual Visit via telephone Note Due to COVID-19 pandemic this visit was conducted virtually. This visit type was conducted due to national recommendations for restrictions regarding the COVID-19 Pandemic (e.g. social distancing, sheltering in place) in an effort to limit this patient's exposure and mitigate transmission in our community. All issues noted in this document were discussed and addressed.  A physical exam was not performed with this format.  I connected with Carl Bruce on 10/20/18 at 2:30 by telephone and verified that I am speaking with the correct person using two identifiers. Carl Bruce is currently located at home and no one is currently with her during visit. The provider, Mary-Margaret Hassell Done, FNP is located in their office at time of visit.  I discussed the limitations, risks, security and privacy concerns of performing an evaluation and management service by telephone and the availability of in person appointments. I also discussed with the patient that there may be a patient responsible charge related to this service. The patient expressed understanding and agreed to proceed.   History and Present Illness:   Chief Complaint: Back Pain   HPI patient c/o low back pain that started over a week ago. He is 36ft tall and has back pain frequently. He does not remember an injury. Rates pain 5-10/10 depending on what he is doing. Pain does not radiate down leg   Review of Systems  Constitutional: Negative for diaphoresis and weight loss.  Eyes: Negative for blurred vision, double vision and pain.  Respiratory: Negative for shortness of breath.   Cardiovascular: Negative for chest pain, palpitations, orthopnea and leg swelling.  Gastrointestinal: Negative for abdominal pain.  Skin: Negative for rash.  Neurological: Negative for dizziness, sensory change, loss of consciousness, weakness and headaches.  Endo/Heme/Allergies: Negative for polydipsia. Does not  bruise/bleed easily.  Psychiatric/Behavioral: Negative for memory loss. The patient does not have insomnia.   All other systems reviewed and are negative.    Observations/Objective: Alert and oriented- answers all questions appropriately mild distress    Assessment and Plan: Carl Bruce in today with chief complaint of Back Pain   1. Acute midline low back pain without sciatica Moist heat Rest - cyclobenzaprine (FLEXERIL) 10 MG tablet; Take 1 tablet (10 mg total) by mouth 3 (three) times daily as needed for muscle spasms.  Dispense: 30 tablet; Refill: 1 - predniSONE (STERAPRED UNI-PAK 21 TAB) 10 MG (21) TBPK tablet; As directed x 6 days  Dispense: 21 tablet; Refill: 0    Follow Up Instructions: prn    I discussed the assessment and treatment plan with the patient. The patient was provided an opportunity to ask questions and all were answered. The patient agreed with the plan and demonstrated an understanding of the instructions.   The patient was advised to call back or seek an in-person evaluation if the symptoms worsen or if the condition fails to improve as anticipated.  The above assessment and management plan was discussed with the patient. The patient verbalized understanding of and has agreed to the management plan. Patient is aware to call the clinic if symptoms persist or worsen. Patient is aware when to return to the clinic for a follow-up visit. Patient educated on when it is appropriate to go to the emergency department.   Time call ended:  2:40  I provided 10 minutes of non-face-to-face time during this encounter.    Mary-Margaret Hassell Done, FNP

## 2018-10-26 MED FILL — CITALOPRAM HBR 20 MG TABLET: 20 | 90 days supply | Qty: 180 | Fill #3

## 2018-10-26 MED FILL — METOPROLOL TARTRATE 100 MG: 100 | 90 days supply | Qty: 180 | Fill #3

## 2018-10-26 MED FILL — ROSUVASTATIN CALCIUM 40 MG: 40 | 90 days supply | Qty: 90 | Fill #3

## 2018-10-26 MED FILL — AMLODIPINE BESYLATE 5 MG TA: 5 | 90 days supply | Qty: 180 | Fill #3

## 2018-10-27 ENCOUNTER — Other Ambulatory Visit: Payer: Self-pay

## 2018-10-27 DIAGNOSIS — Z20822 Contact with and (suspected) exposure to covid-19: Secondary | ICD-10-CM

## 2018-10-28 LAB — NOVEL CORONAVIRUS, NAA: SARS-CoV-2, NAA: NOT DETECTED

## 2019-01-28 ENCOUNTER — Other Ambulatory Visit: Payer: Self-pay | Admitting: *Deleted

## 2019-01-28 DIAGNOSIS — I10 Essential (primary) hypertension: Secondary | ICD-10-CM

## 2019-01-28 DIAGNOSIS — F411 Generalized anxiety disorder: Secondary | ICD-10-CM

## 2019-01-28 DIAGNOSIS — E782 Mixed hyperlipidemia: Secondary | ICD-10-CM

## 2019-01-28 DIAGNOSIS — Z952 Presence of prosthetic heart valve: Secondary | ICD-10-CM

## 2019-01-28 MED ORDER — CITALOPRAM HYDROBROMIDE 20 MG PO TABS: 20.0000 mg | ORAL_TABLET | Freq: Two times a day (BID) | ORAL | 0 refills | Status: DC

## 2019-01-28 MED ORDER — METOPROLOL TARTRATE 100 MG PO TABS: 100.0000 mg | ORAL_TABLET | Freq: Two times a day (BID) | ORAL | 0 refills | Status: DC

## 2019-01-28 MED ORDER — AMLODIPINE BESYLATE 5 MG PO TABS: ORAL_TABLET | ORAL | 0 refills | Status: DC

## 2019-01-28 MED ORDER — ROSUVASTATIN CALCIUM 40 MG PO TABS: 40.0000 mg | ORAL_TABLET | Freq: Every day | ORAL | 0 refills | Status: DC

## 2019-01-28 MED FILL — ROSUVASTATIN CALCIUM 40 MG: 40 | 90 days supply | Qty: 90 | Fill #0

## 2019-01-28 MED FILL — METOPROLOL TARTRATE 100 MG: 100 | 90 days supply | Qty: 180 | Fill #0

## 2019-01-28 MED FILL — AMLODIPINE BESYLATE 5 MG TA: 5 | 90 days supply | Qty: 180 | Fill #0

## 2019-01-28 MED FILL — CITALOPRAM HBR 20 MG TABLET: 20 | 90 days supply | Qty: 180 | Fill #0

## 2019-03-12 ENCOUNTER — Other Ambulatory Visit: Payer: Self-pay

## 2019-03-13 ENCOUNTER — Other Ambulatory Visit: Payer: Self-pay | Admitting: Nurse Practitioner

## 2019-03-13 ENCOUNTER — Ambulatory Visit (INDEPENDENT_AMBULATORY_CARE_PROVIDER_SITE_OTHER): Payer: 59

## 2019-03-13 ENCOUNTER — Encounter: Payer: Self-pay | Admitting: Nurse Practitioner

## 2019-03-13 ENCOUNTER — Ambulatory Visit (INDEPENDENT_AMBULATORY_CARE_PROVIDER_SITE_OTHER): Payer: No Typology Code available for payment source | Admitting: Nurse Practitioner

## 2019-03-13 VITALS — BP 103/69 | HR 48 | Temp 97.1°F | Resp 20 | Ht >= 80 in | Wt 320.0 lb

## 2019-03-13 DIAGNOSIS — Q87410 Marfan's syndrome with aortic dilation: Secondary | ICD-10-CM

## 2019-03-13 DIAGNOSIS — E782 Mixed hyperlipidemia: Secondary | ICD-10-CM | POA: Diagnosis not present

## 2019-03-13 DIAGNOSIS — Z952 Presence of prosthetic heart valve: Secondary | ICD-10-CM

## 2019-03-13 DIAGNOSIS — F411 Generalized anxiety disorder: Secondary | ICD-10-CM | POA: Diagnosis not present

## 2019-03-13 DIAGNOSIS — I10 Essential (primary) hypertension: Secondary | ICD-10-CM | POA: Diagnosis not present

## 2019-03-13 DIAGNOSIS — M25561 Pain in right knee: Secondary | ICD-10-CM

## 2019-03-13 DIAGNOSIS — Z0001 Encounter for general adult medical examination with abnormal findings: Secondary | ICD-10-CM | POA: Diagnosis not present

## 2019-03-13 DIAGNOSIS — L97512 Non-pressure chronic ulcer of other part of right foot with fat layer exposed: Secondary | ICD-10-CM

## 2019-03-13 DIAGNOSIS — G8929 Other chronic pain: Secondary | ICD-10-CM

## 2019-03-13 DIAGNOSIS — M25562 Pain in left knee: Secondary | ICD-10-CM

## 2019-03-13 DIAGNOSIS — Z0000 Encounter for general adult medical examination without abnormal findings: Secondary | ICD-10-CM | POA: Diagnosis not present

## 2019-03-13 IMAGING — DX DG KNEE 1-2V*R*
2 series · 2 of 2 positions shown · non-contrast
Comparison: [DATE]

CLINICAL DATA: Knee pain, history of Marfan syndrome

EXAM:
RIGHT KNEE - 1-2 VIEW

[knee ap]
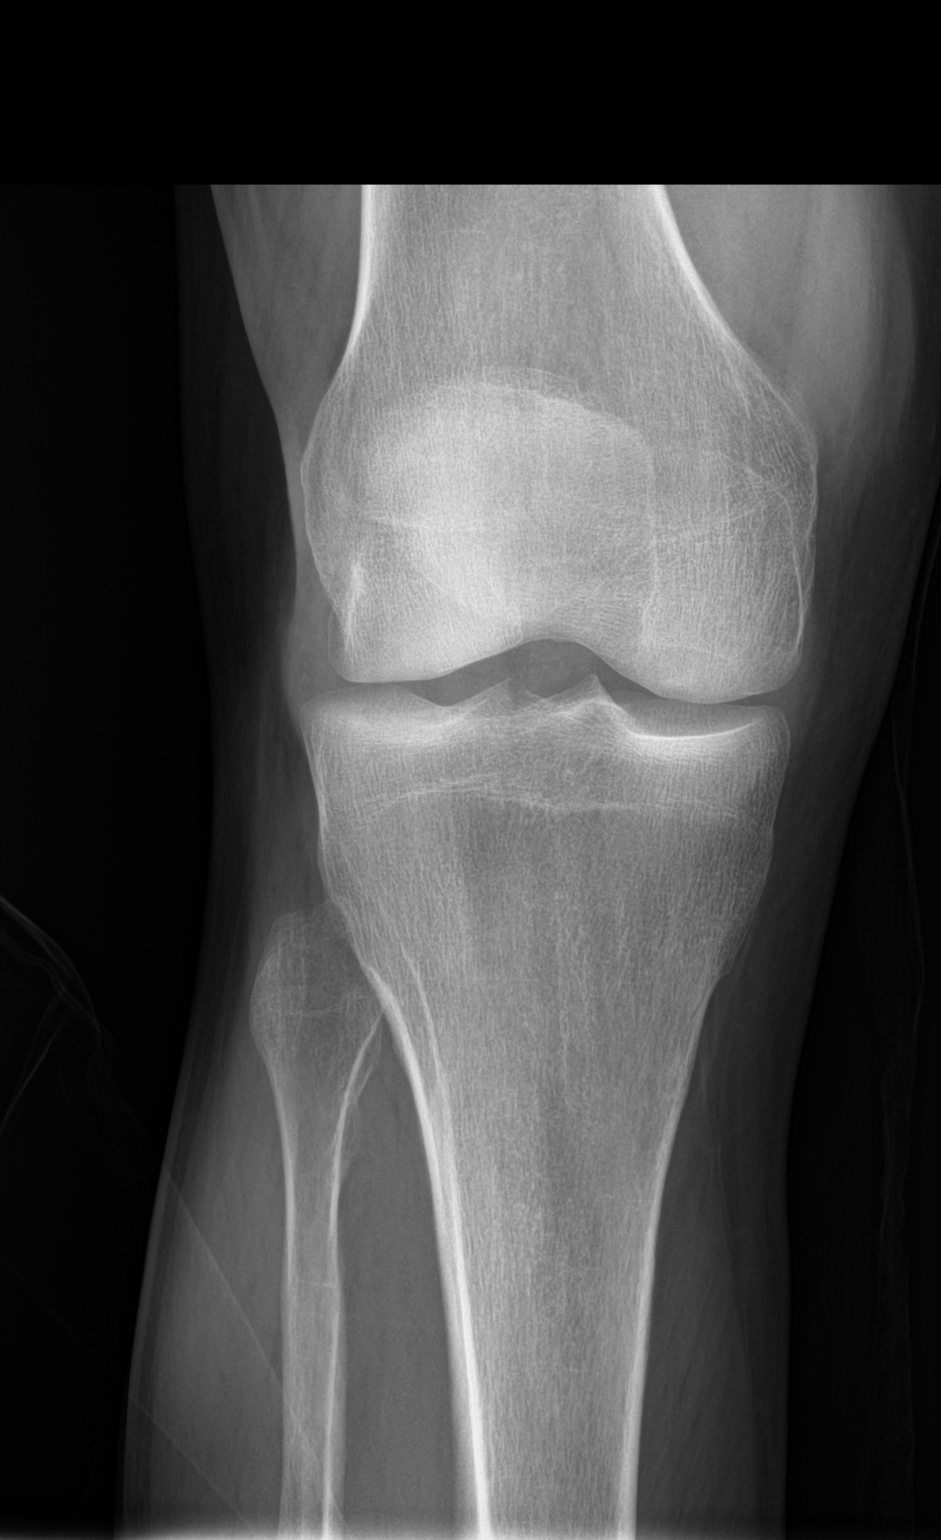

[knee lat]
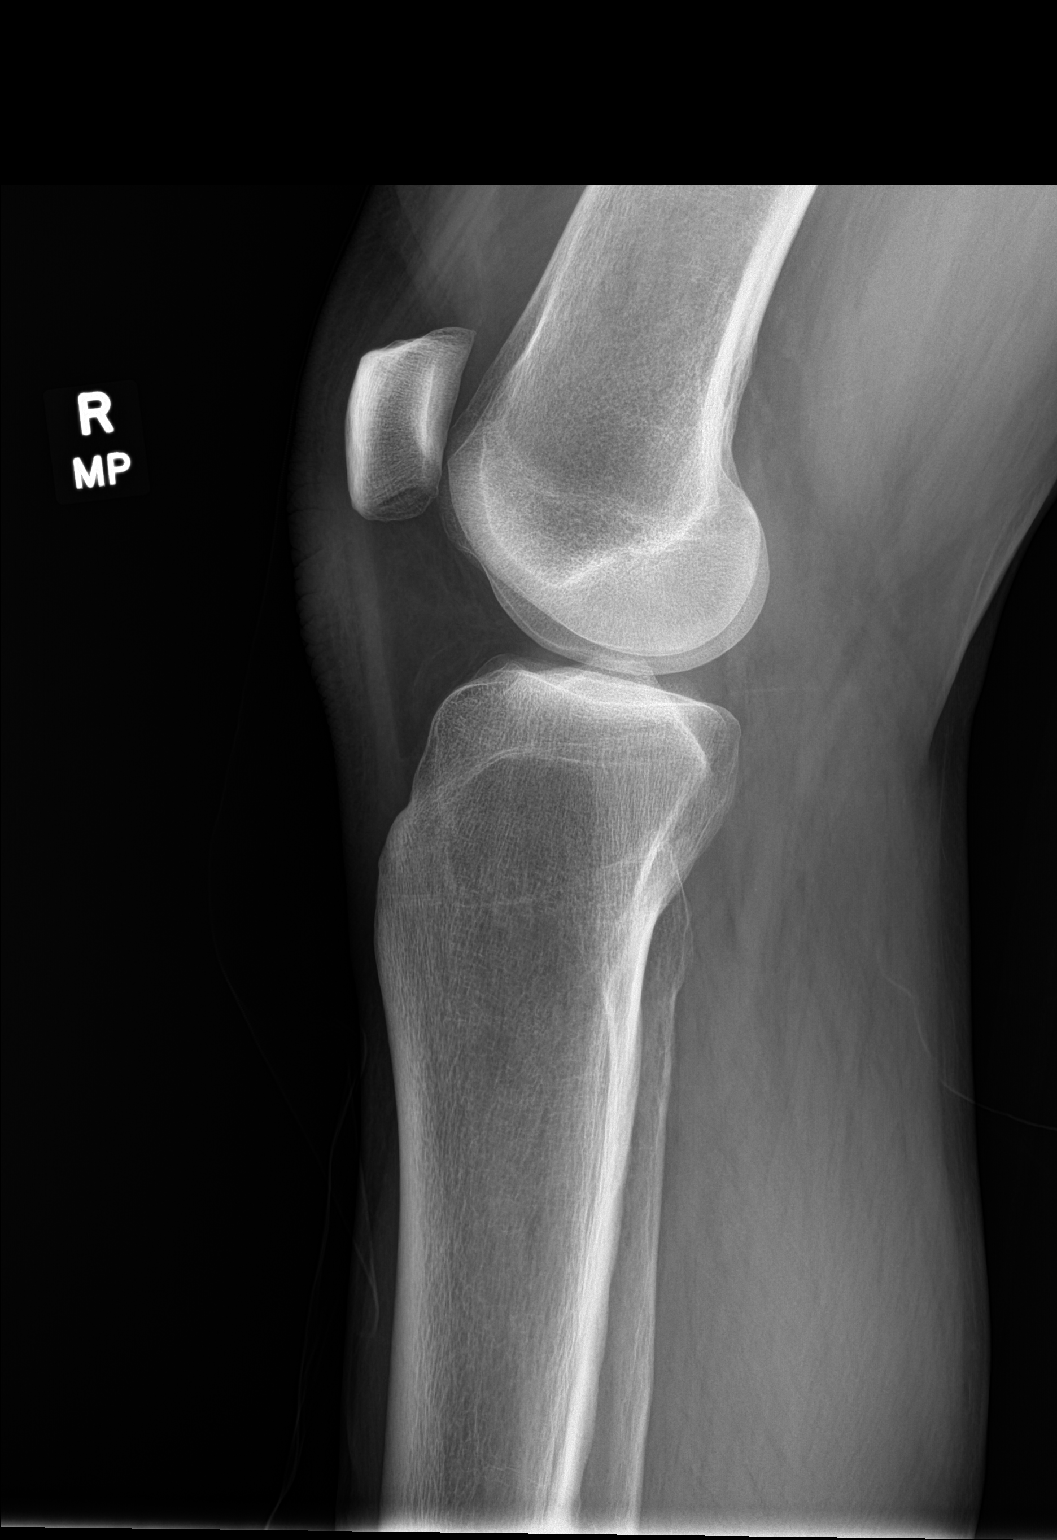

[2 of 2 positions shown; findings below may reference images not displayed]

FINDINGS: No evidence of fracture, dislocation, or joint effusion. No evidence
of arthropathy or other focal bone abnormality. Soft tissues are
unremarkable.
IMPRESSION: Negative.

## 2019-03-13 IMAGING — DX DG CHEST 2V
4 series · 4 of 4 positions shown · non-contrast
Comparison: [DATE]

CLINICAL DATA: Marfan syndrome, history of aortic valve
reconstruction

EXAM:
CHEST - 2 VIEW

[chest pa (1 of 2)]
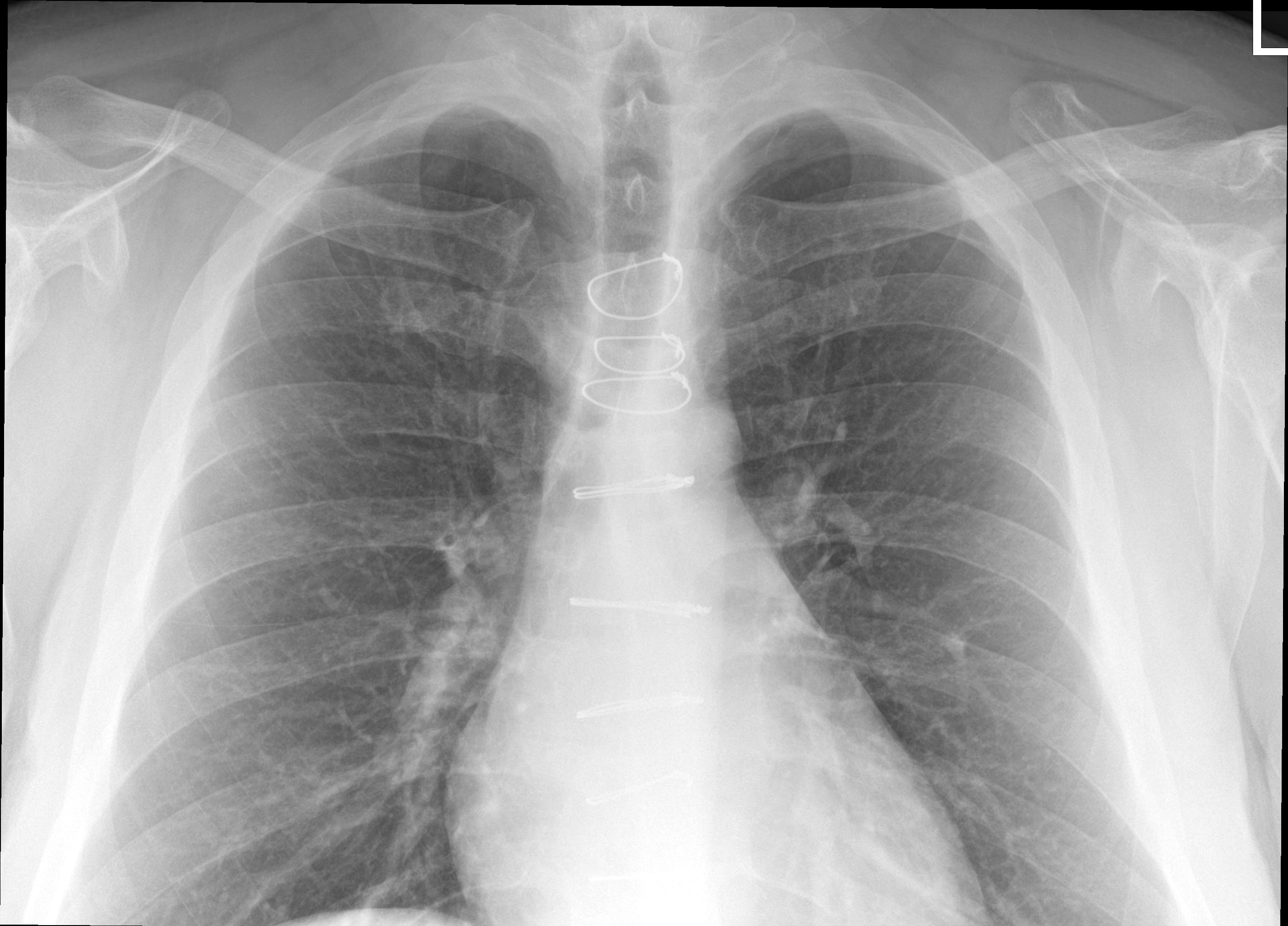

[chest lat (1 of 2)]
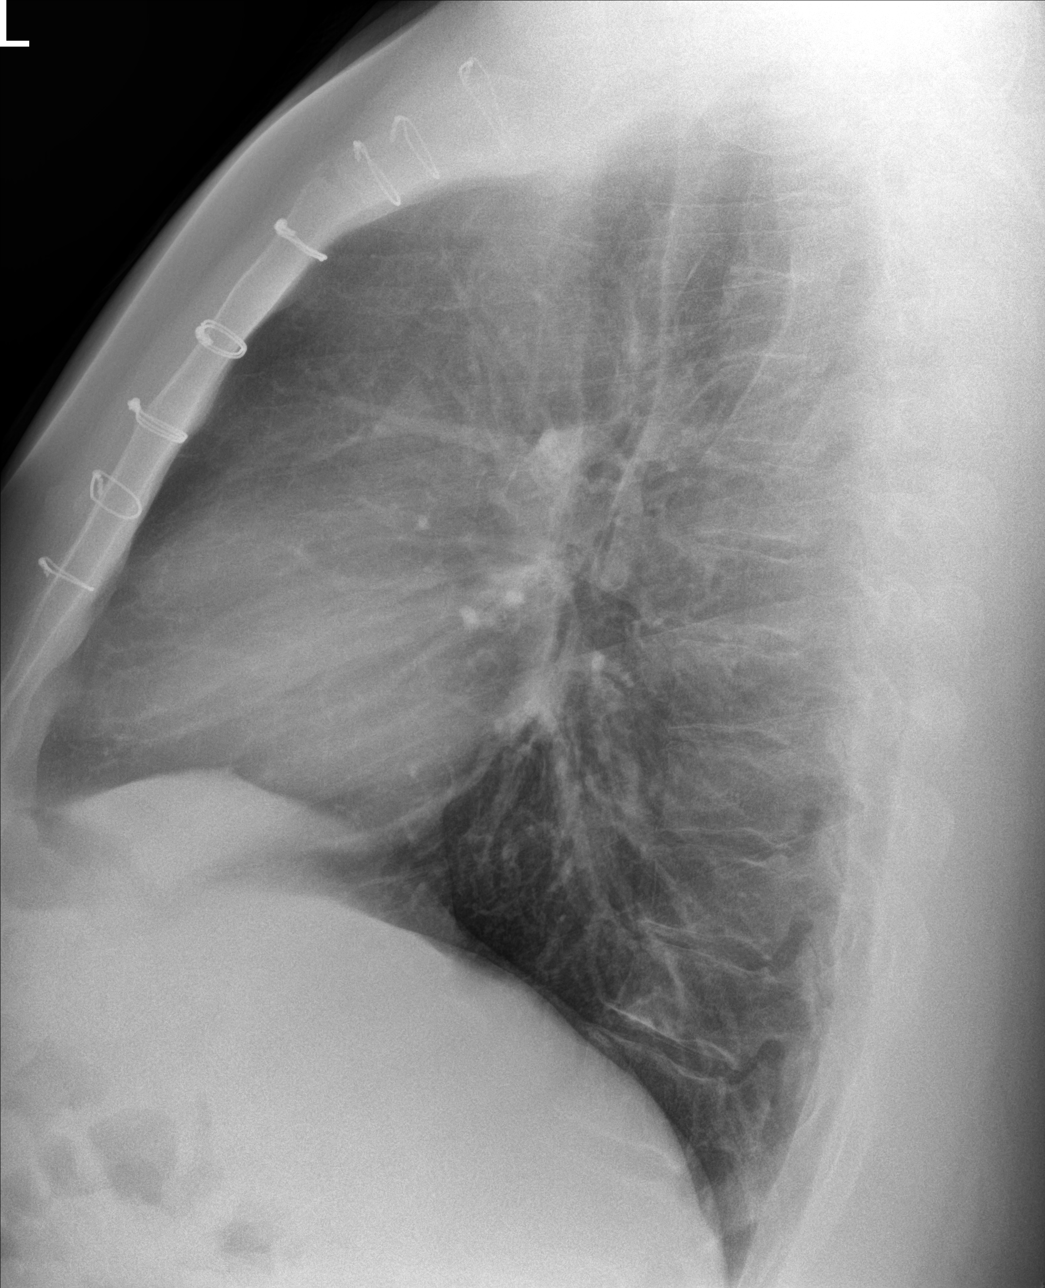

[chest pa (2 of 2)]
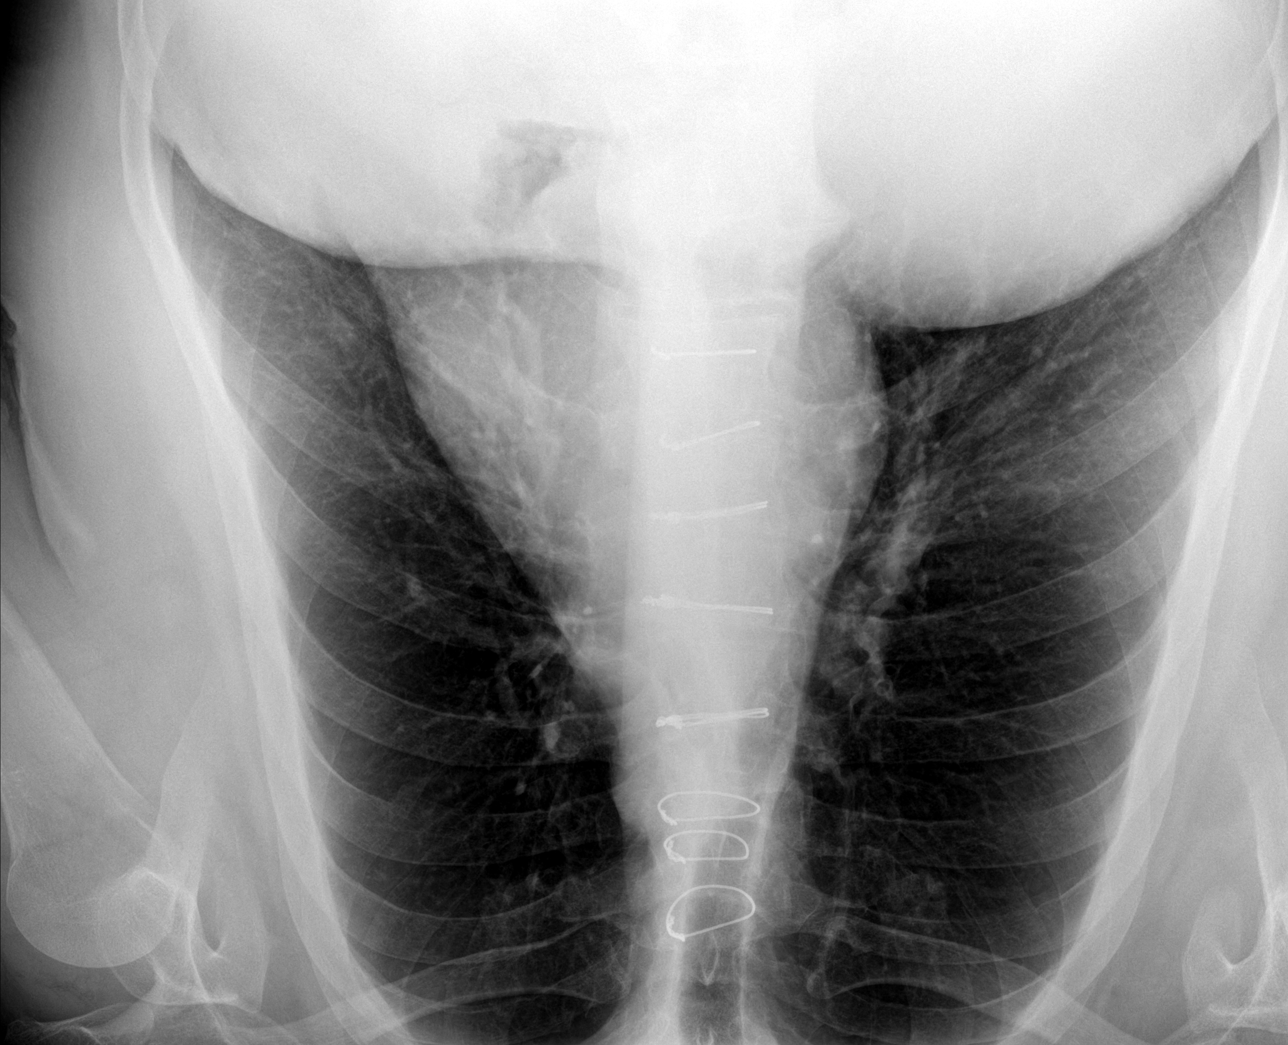

[chest lat (2 of 2)]
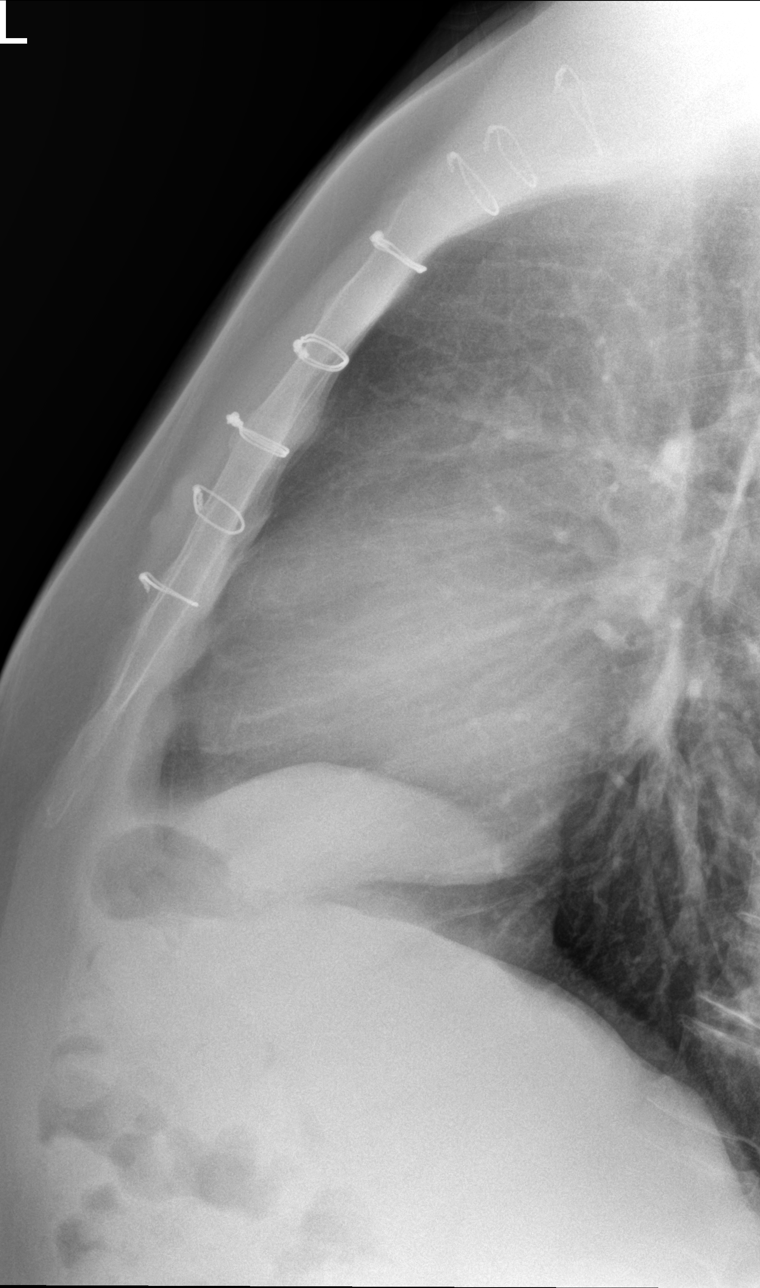

[4 of 4 positions shown; findings below may reference images not displayed]

FINDINGS: Frontal and lateral views of the chest demonstrate postsurgical
changes from prior median sternotomy. The cardiac silhouette is
unremarkable. Mediastinal contours normal. No airspace disease,
effusion, or pneumothorax. No acute bony abnormalities.
IMPRESSION: 1. Stable postsurgical changes.  No acute process.

## 2019-03-13 IMAGING — DX DG KNEE 1-2V*L*
2 series · 2 of 2 positions shown · non-contrast
Comparison: [DATE]

CLINICAL DATA: Knee pain, history of Marfan syndrome

EXAM:
LEFT KNEE - 1-2 VIEW

[knee ap]
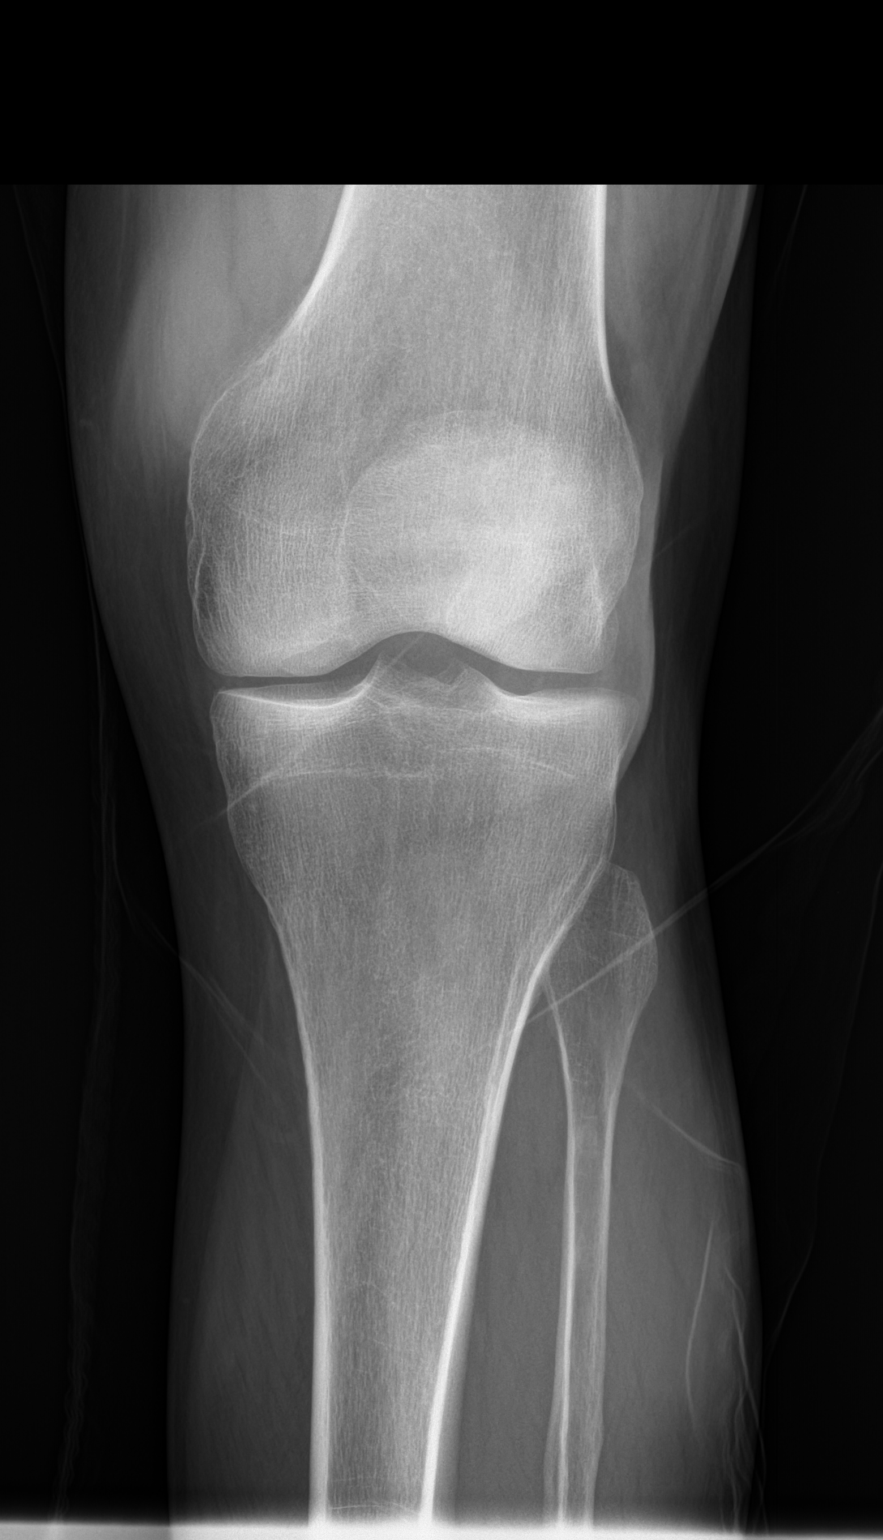

[knee lat]
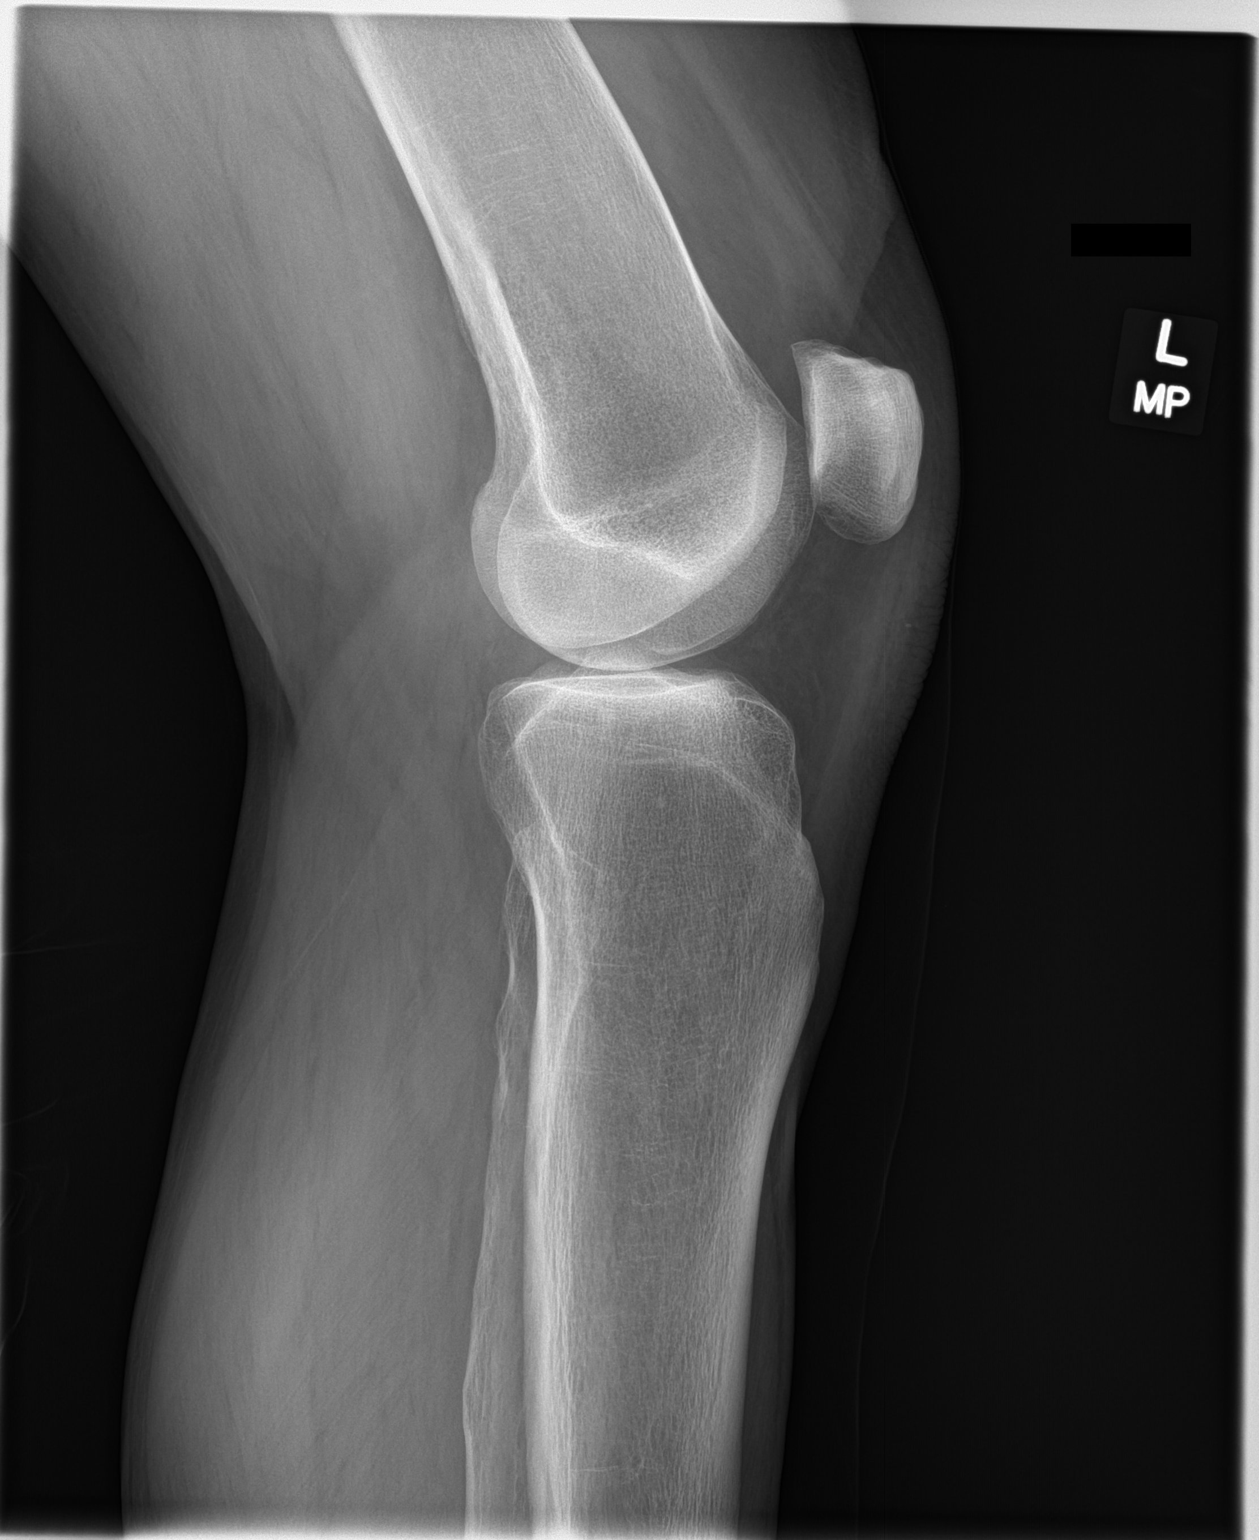

[2 of 2 positions shown; findings below may reference images not displayed]

FINDINGS: No evidence of fracture, dislocation, or joint effusion. Minimal
medial compartmental joint space narrowing. Soft tissues are
unremarkable.
IMPRESSION: 1. Stable mild medial compartmental joint space narrowing.

## 2019-03-13 MED ORDER — AMLODIPINE BESYLATE 5 MG PO TABS: ORAL_TABLET | ORAL | 0 refills | Status: DC

## 2019-03-13 MED ORDER — CITALOPRAM HYDROBROMIDE 20 MG PO TABS: 20.0000 mg | ORAL_TABLET | Freq: Two times a day (BID) | ORAL | 0 refills | Status: DC

## 2019-03-13 MED ORDER — ROSUVASTATIN CALCIUM 40 MG PO TABS: 40.0000 mg | ORAL_TABLET | Freq: Every day | ORAL | 0 refills | Status: DC

## 2019-03-13 MED ORDER — METOPROLOL TARTRATE 100 MG PO TABS: 100.0000 mg | ORAL_TABLET | Freq: Two times a day (BID) | ORAL | 0 refills | Status: DC

## 2019-03-13 NOTE — Patient Instructions (Signed)
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
## 2019-03-14 LAB — CMP14+EGFR
ALT: 23 IU/L (ref 0–44)
AST: 21 IU/L (ref 0–40)
Albumin/Globulin Ratio: 2 (ref 1.2–2.2)
Albumin: 4.7 g/dL (ref 4.0–5.0)
Alkaline Phosphatase: 93 IU/L (ref 39–117)
BUN/Creatinine Ratio: 14 (ref 9–20)
BUN: 16 mg/dL (ref 6–24)
Bilirubin Total: 0.8 mg/dL (ref 0.0–1.2)
CO2: 24 mmol/L (ref 20–29)
Calcium: 9.5 mg/dL (ref 8.7–10.2)
Chloride: 100 mmol/L (ref 96–106)
Creatinine, Ser: 1.18 mg/dL (ref 0.76–1.27)
GFR calc Af Amer: 87 mL/min/{1.73_m2} (ref >59)
GFR calc non Af Amer: 76 mL/min/{1.73_m2} (ref >59)
Globulin, Total: 2.3 g/dL (ref 1.5–4.5)
Glucose: 96 mg/dL (ref 65–99)
Potassium: 4.9 mmol/L (ref 3.5–5.2)
Sodium: 138 mmol/L (ref 134–144)
Total Protein: 7 g/dL (ref 6.0–8.5)

## 2019-03-14 LAB — CBC WITH DIFFERENTIAL/PLATELET
Basophils Absolute: 0.1 10*3/uL (ref 0.0–0.2)
Basos: 1 %
EOS (ABSOLUTE): 0.3 10*3/uL (ref 0.0–0.4)
Eos: 4 %
Hematocrit: 46.3 % (ref 37.5–51.0)
Hemoglobin: 15.1 g/dL (ref 13.0–17.7)
Immature Grans (Abs): 0.1 10*3/uL (ref 0.0–0.1)
Immature Granulocytes: 1 %
Lymphocytes Absolute: 2.7 10*3/uL (ref 0.7–3.1)
Lymphs: 35 %
MCH: 26.3 pg — ABNORMAL LOW (ref 26.6–33.0)
MCHC: 32.6 g/dL (ref 31.5–35.7)
MCV: 81 fL (ref 79–97)
Monocytes Absolute: 0.7 10*3/uL (ref 0.1–0.9)
Monocytes: 9 %
Neutrophils Absolute: 4 10*3/uL (ref 1.4–7.0)
Neutrophils: 50 %
Platelets: 254 10*3/uL (ref 150–450)
RBC: 5.74 x10E6/uL (ref 4.14–5.80)
RDW: 13.1 % (ref 11.6–15.4)
WBC: 7.9 10*3/uL (ref 3.4–10.8)

## 2019-03-14 LAB — LIPID PANEL
Chol/HDL Ratio: 3.5 ratio (ref 0.0–5.0)
Cholesterol, Total: 111 mg/dL (ref 100–199)
HDL: 32 mg/dL — ABNORMAL LOW (ref >39)
LDL Chol Calc (NIH): 54 mg/dL (ref 0–99)
Triglycerides: 143 mg/dL (ref 0–149)
VLDL Cholesterol Cal: 25 mg/dL (ref 5–40)

## 2019-03-14 LAB — THYROID PANEL WITH TSH
Free Thyroxine Index: 1.3 (ref 1.2–4.9)
T3 Uptake Ratio: 20 % — ABNORMAL LOW (ref 24–39)
T4, Total: 6.4 ug/dL (ref 4.5–12.0)
TSH: 2.11 u[IU]/mL (ref 0.450–4.500)

## 2019-03-19 DIAGNOSIS — Z79899 Other long term (current) drug therapy: Secondary | ICD-10-CM | POA: Diagnosis not present

## 2019-03-19 DIAGNOSIS — R079 Chest pain, unspecified: Secondary | ICD-10-CM | POA: Diagnosis not present

## 2019-03-19 DIAGNOSIS — I719 Aortic aneurysm of unspecified site, without rupture: Secondary | ICD-10-CM | POA: Diagnosis not present

## 2019-03-19 DIAGNOSIS — Q87418 Marfan's syndrome with other cardiovascular manifestations: Secondary | ICD-10-CM | POA: Diagnosis not present

## 2019-03-19 DIAGNOSIS — R0602 Shortness of breath: Secondary | ICD-10-CM | POA: Diagnosis not present

## 2019-03-19 DIAGNOSIS — I4510 Unspecified right bundle-branch block: Secondary | ICD-10-CM | POA: Diagnosis not present

## 2019-03-19 DIAGNOSIS — I440 Atrioventricular block, first degree: Secondary | ICD-10-CM | POA: Diagnosis not present

## 2019-03-19 DIAGNOSIS — Z952 Presence of prosthetic heart valve: Secondary | ICD-10-CM | POA: Diagnosis not present

## 2019-03-19 DIAGNOSIS — I712 Thoracic aortic aneurysm, without rupture: Secondary | ICD-10-CM | POA: Diagnosis not present

## 2019-03-19 DIAGNOSIS — Q8740 Marfan's syndrome, unspecified: Secondary | ICD-10-CM | POA: Diagnosis not present

## 2019-03-24 DIAGNOSIS — L97521 Non-pressure chronic ulcer of other part of left foot limited to breakdown of skin: Secondary | ICD-10-CM | POA: Diagnosis not present

## 2019-03-24 DIAGNOSIS — G609 Hereditary and idiopathic neuropathy, unspecified: Secondary | ICD-10-CM | POA: Diagnosis not present

## 2019-03-31 DIAGNOSIS — R0789 Other chest pain: Secondary | ICD-10-CM | POA: Diagnosis not present

## 2019-03-31 DIAGNOSIS — R079 Chest pain, unspecified: Secondary | ICD-10-CM | POA: Diagnosis not present

## 2019-03-31 DIAGNOSIS — I440 Atrioventricular block, first degree: Secondary | ICD-10-CM | POA: Diagnosis not present

## 2019-03-31 DIAGNOSIS — R0602 Shortness of breath: Secondary | ICD-10-CM | POA: Diagnosis not present

## 2019-03-31 DIAGNOSIS — L97519 Non-pressure chronic ulcer of other part of right foot with unspecified severity: Secondary | ICD-10-CM | POA: Diagnosis not present

## 2019-03-31 DIAGNOSIS — Q8740 Marfan's syndrome, unspecified: Secondary | ICD-10-CM | POA: Diagnosis not present

## 2019-03-31 DIAGNOSIS — R9389 Abnormal findings on diagnostic imaging of other specified body structures: Secondary | ICD-10-CM | POA: Diagnosis not present

## 2019-03-31 DIAGNOSIS — F411 Generalized anxiety disorder: Secondary | ICD-10-CM | POA: Diagnosis not present

## 2019-03-31 DIAGNOSIS — I10 Essential (primary) hypertension: Secondary | ICD-10-CM | POA: Diagnosis not present

## 2019-03-31 DIAGNOSIS — Z953 Presence of xenogenic heart valve: Secondary | ICD-10-CM | POA: Diagnosis not present

## 2019-04-01 DIAGNOSIS — I4510 Unspecified right bundle-branch block: Secondary | ICD-10-CM | POA: Diagnosis not present

## 2019-04-01 DIAGNOSIS — I10 Essential (primary) hypertension: Secondary | ICD-10-CM | POA: Diagnosis not present

## 2019-04-01 DIAGNOSIS — R0602 Shortness of breath: Secondary | ICD-10-CM | POA: Diagnosis not present

## 2019-04-01 DIAGNOSIS — R9389 Abnormal findings on diagnostic imaging of other specified body structures: Secondary | ICD-10-CM | POA: Diagnosis not present

## 2019-04-01 DIAGNOSIS — R001 Bradycardia, unspecified: Secondary | ICD-10-CM | POA: Diagnosis not present

## 2019-04-01 DIAGNOSIS — R0789 Other chest pain: Secondary | ICD-10-CM | POA: Diagnosis not present

## 2019-04-01 DIAGNOSIS — Z954 Presence of other heart-valve replacement: Secondary | ICD-10-CM | POA: Diagnosis not present

## 2019-04-01 DIAGNOSIS — F411 Generalized anxiety disorder: Secondary | ICD-10-CM | POA: Diagnosis not present

## 2019-04-01 DIAGNOSIS — Z952 Presence of prosthetic heart valve: Secondary | ICD-10-CM | POA: Diagnosis not present

## 2019-04-01 DIAGNOSIS — R931 Abnormal findings on diagnostic imaging of heart and coronary circulation: Secondary | ICD-10-CM | POA: Diagnosis not present

## 2019-04-01 DIAGNOSIS — Q8740 Marfan's syndrome, unspecified: Secondary | ICD-10-CM | POA: Diagnosis not present

## 2019-04-01 DIAGNOSIS — R9439 Abnormal result of other cardiovascular function study: Secondary | ICD-10-CM | POA: Diagnosis not present

## 2019-04-01 DIAGNOSIS — L97519 Non-pressure chronic ulcer of other part of right foot with unspecified severity: Secondary | ICD-10-CM | POA: Diagnosis not present

## 2019-04-01 DIAGNOSIS — Z95828 Presence of other vascular implants and grafts: Secondary | ICD-10-CM | POA: Diagnosis not present

## 2019-04-01 DIAGNOSIS — R079 Chest pain, unspecified: Secondary | ICD-10-CM | POA: Diagnosis not present

## 2019-04-01 DIAGNOSIS — Z953 Presence of xenogenic heart valve: Secondary | ICD-10-CM | POA: Diagnosis not present

## 2019-04-01 DIAGNOSIS — Z8679 Personal history of other diseases of the circulatory system: Secondary | ICD-10-CM | POA: Diagnosis not present

## 2019-04-01 DIAGNOSIS — I440 Atrioventricular block, first degree: Secondary | ICD-10-CM | POA: Diagnosis not present

## 2019-04-01 MED ORDER — MELATONIN 3 MG PO TABS
6.00 | ORAL_TABLET | ORAL | Status: DC
Start: ? — End: 2019-04-01

## 2019-04-01 MED ORDER — ROSUVASTATIN CALCIUM 20 MG PO TABS
40.00 | ORAL_TABLET | ORAL | Status: DC
Start: 2019-04-01 — End: 2019-04-01

## 2019-04-01 MED ORDER — LISINOPRIL 20 MG PO TABS
20.00 | ORAL_TABLET | ORAL | Status: DC
Start: 2019-04-02 — End: 2019-04-01

## 2019-04-01 MED ORDER — CITALOPRAM HYDROBROMIDE 20 MG PO TABS
20.00 | ORAL_TABLET | ORAL | Status: DC
Start: 2019-04-01 — End: 2019-04-01

## 2019-04-01 MED ORDER — METOPROLOL SUCCINATE ER 50 MG PO TB24
50.00 | ORAL_TABLET | ORAL | Status: DC
Start: 2019-04-02 — End: 2019-04-01

## 2019-04-01 MED ORDER — MUPIROCIN 2 % EX OINT
TOPICAL_OINTMENT | CUTANEOUS | Status: DC
Start: 2019-04-02 — End: 2019-04-01

## 2019-04-01 MED ORDER — ACETAMINOPHEN 325 MG PO TABS
650.00 | ORAL_TABLET | ORAL | Status: DC
Start: ? — End: 2019-04-01

## 2019-04-02 DIAGNOSIS — R0789 Other chest pain: Secondary | ICD-10-CM | POA: Diagnosis not present

## 2019-04-02 DIAGNOSIS — R079 Chest pain, unspecified: Secondary | ICD-10-CM | POA: Diagnosis not present

## 2019-04-02 DIAGNOSIS — Z9889 Other specified postprocedural states: Secondary | ICD-10-CM | POA: Insufficient documentation

## 2019-04-02 DIAGNOSIS — I10 Essential (primary) hypertension: Secondary | ICD-10-CM | POA: Diagnosis not present

## 2019-04-02 DIAGNOSIS — Q245 Malformation of coronary vessels: Secondary | ICD-10-CM | POA: Insufficient documentation

## 2019-04-02 DIAGNOSIS — I4510 Unspecified right bundle-branch block: Secondary | ICD-10-CM | POA: Diagnosis not present

## 2019-04-02 DIAGNOSIS — Q8740 Marfan's syndrome, unspecified: Secondary | ICD-10-CM | POA: Diagnosis not present

## 2019-04-02 DIAGNOSIS — L97519 Non-pressure chronic ulcer of other part of right foot with unspecified severity: Secondary | ICD-10-CM | POA: Diagnosis not present

## 2019-04-02 DIAGNOSIS — I440 Atrioventricular block, first degree: Secondary | ICD-10-CM | POA: Diagnosis not present

## 2019-04-02 DIAGNOSIS — R931 Abnormal findings on diagnostic imaging of heart and coronary circulation: Secondary | ICD-10-CM | POA: Diagnosis not present

## 2019-04-02 DIAGNOSIS — R0602 Shortness of breath: Secondary | ICD-10-CM | POA: Diagnosis not present

## 2019-04-02 DIAGNOSIS — Z953 Presence of xenogenic heart valve: Secondary | ICD-10-CM | POA: Diagnosis not present

## 2019-04-02 DIAGNOSIS — Z95828 Presence of other vascular implants and grafts: Secondary | ICD-10-CM | POA: Diagnosis not present

## 2019-04-02 DIAGNOSIS — R9389 Abnormal findings on diagnostic imaging of other specified body structures: Secondary | ICD-10-CM | POA: Diagnosis not present

## 2019-04-02 DIAGNOSIS — F411 Generalized anxiety disorder: Secondary | ICD-10-CM | POA: Diagnosis not present

## 2019-04-02 DIAGNOSIS — R001 Bradycardia, unspecified: Secondary | ICD-10-CM | POA: Diagnosis not present

## 2019-04-03 ENCOUNTER — Other Ambulatory Visit: Payer: Self-pay | Admitting: *Deleted

## 2019-04-03 NOTE — Patient Outreach (Signed)
[facility]) Care Management  04/03/2019  Carl Bruce 1975-05-03 [id number]   Transition of care telephone call  Referral received:04/03/19 Initial outreach:04/03/19 Insurance: [facility] [employer]  Initial unsuccessful telephone call to patient's preferred number in order to complete transition of care assessment; no answer, left HIPAA compliant voicemail message requesting return call.   Objective: Per the electronic medical record, Carl Bruce   was hospitalized at [facility]  From 2/23-2/15  With/for Chest pain , shortness of breath  . Comorbidities include:Marfans Syndrome, Aortic root repair, hypertension , hyperlipidemia .  He was discharged to home on 2/25/21without the need for home health services or durable medical equipment per the discharge summary.   Plan: This RNCM will route unsuccessful outreach letter with [facility] Care Management pamphlet and 24 hour Nurse Advice Line Magnet to [employer] Care Management clinical pool to be mailed to patient's home address. This RNCM will attempt another outreach within 4 business days.  Carl Garibaldi, RN, BSN  [facility] Care Management,Care Management Coordinator  [phone]- Mobile [phone]- Toll Free Main Office

## 2019-04-07 ENCOUNTER — Other Ambulatory Visit: Payer: Self-pay | Admitting: *Deleted

## 2019-04-07 DIAGNOSIS — L97521 Non-pressure chronic ulcer of other part of left foot limited to breakdown of skin: Secondary | ICD-10-CM | POA: Diagnosis not present

## 2019-04-07 NOTE — Patient Outreach (Signed)
[facility]) Care Management  04/07/2019  Carl Bruce 11/22/1975 [id number]   Transition of care call  Referral received: 04/03/19 Initial outreach attempt:04/03/19 Insurance: Focus plan   2nd unsuccessful telephone call to patient's preferred contact number in order to complete post hospital discharge transition of care assessment , no answer left HIPAA compliant message requesting return call.    Objective: Per the electronic medical record, Carl Bruce   was hospitalized at [facility]  From 2/23-2/15  With/for Chest pain , shortness of breath  . Comorbidities include:Marfans Syndrome, Aortic root repair, hypertension , hyperlipidemia .  He was discharged to home on 2/25/21without the need for home health services or durable medical equipment per the discharge summary    Plan If no return call from patient will attempt 3rd outreach in the next 4 business days.   Egbert Garibaldi, RN, BSN  [facility] Care Management,Care Management Coordinator  [phone]- Mobile [phone]- Toll Free Main Office

## 2019-04-09 ENCOUNTER — Encounter: Payer: Self-pay | Admitting: Nurse Practitioner

## 2019-04-09 ENCOUNTER — Other Ambulatory Visit: Payer: Self-pay

## 2019-04-09 ENCOUNTER — Ambulatory Visit (INDEPENDENT_AMBULATORY_CARE_PROVIDER_SITE_OTHER): Payer: 59 | Admitting: Nurse Practitioner

## 2019-04-09 VITALS — BP 118/76 | HR 65 | Temp 97.2°F | Resp 20 | Ht >= 80 in | Wt 316.0 lb

## 2019-04-09 DIAGNOSIS — R5383 Other fatigue: Secondary | ICD-10-CM | POA: Diagnosis not present

## 2019-04-09 DIAGNOSIS — Z09 Encounter for follow-up examination after completed treatment for conditions other than malignant neoplasm: Secondary | ICD-10-CM | POA: Diagnosis not present

## 2019-04-09 DIAGNOSIS — I208 Other forms of angina pectoris: Secondary | ICD-10-CM

## 2019-04-09 DIAGNOSIS — R0602 Shortness of breath: Secondary | ICD-10-CM

## 2019-04-09 NOTE — Progress Notes (Signed)
Subjective:    Patient ID: Carl Bruce, male    DOB: November 03, 1975, 44 y.o.   MRN: [id number]   Chief Complaint: Hospitalization Follow-up   HPI Patient comes in today for hospital follow up. He was in hospital last week with what they thought was leaking aortic arch replacement. He had 2 chest CT, 2 stress test  And a cardiac cath. His cath finally showed a heart attack but no leakage. He was started on metoprolol53m 1 daily and lisinopril 23m1 per day and spirolactone 2557m daily and imdur 35m63mily along a baby ASA. He follows up with cardiology in 2 weeks. He is here today because he needs potassium rechecked. h eis still having Chest pain, and sob, which he was having when he left the hosital. Still feels very weak.     Review of Systems  Constitutional: Negative.   Respiratory: Positive for shortness of breath.   Cardiovascular: Positive for chest pain.  Genitourinary: Negative.   Neurological: Negative.   Psychiatric/Behavioral: Negative.   All other systems reviewed and are negative.      Objective:   Physical Exam Vitals and nursing note reviewed.  Constitutional:      Appearance: Normal appearance. He is well-developed.  HENT:     Head: Normocephalic.     Nose: Nose normal.  Eyes:     Pupils: Pupils are equal, round, and reactive to light.  Neck:     Thyroid: No thyroid mass or thyromegaly.     Vascular: No carotid bruit or JVD.     Trachea: Phonation normal.  Cardiovascular:     Rate and Rhythm: Normal rate and regular rhythm.  Pulmonary:     Effort: Pulmonary effort is normal. No respiratory distress.     Breath sounds: Normal breath sounds.  Abdominal:     General: Bowel sounds are normal.     Palpations: Abdomen is soft.     Tenderness: There is no abdominal tenderness.  Musculoskeletal:        General: Normal range of motion.     Cervical back: Normal range of motion and neck supple.  Lymphadenopathy:     Cervical: No cervical adenopathy.    Skin:    General: Skin is warm and dry.  Neurological:     Mental Status: He is alert and oriented to person, place, and time.  Psychiatric:        Behavior: Behavior normal.        Thought Content: Thought content normal.        Judgment: Judgment normal.     BP 118/76   Pulse 65   Temp (!) 97.2 F (36.2 C) (Temporal)   Resp 20   Ht 7' (2.134 m)   Wt (!) 316 lb (143.3 kg)   SpO2 94%   BMI 31.49 kg/m         Assessment & Plan:  JeffClarene Duketoday with chief complaint of Hospitalization Follow-up   1. Stable angina pectoris (HCC) No strenous activity  2. SOB (shortness of breath) rest - CBC with Differential/Platelet  3. Fatigue, unspecified type Labs pending - BMP8+EGFR - Thyroid Panel With TSH  4. Hospital discharge follow-up Hospital records reviewed    The above assessment and management plan was discussed with the patient. The patient verbalized understanding of and has agreed to the management plan. Patient is aware to call the clinic if symptoms persist or worsen. Patient is aware when to return to the clinic  for a follow-up visit. Patient educated on when it is appropriate to go to the emergency department.   Mary-Margaret Hassell Done, FNP

## 2019-04-10 ENCOUNTER — Other Ambulatory Visit: Payer: Self-pay | Admitting: *Deleted

## 2019-04-10 LAB — CBC WITH DIFFERENTIAL/PLATELET
Basophils Absolute: 0.1 10*3/uL (ref 0.0–0.2)
Basos: 1 %
EOS (ABSOLUTE): 0.4 10*3/uL (ref 0.0–0.4)
Eos: 5 %
Hematocrit: 47.4 % (ref 37.5–51.0)
Hemoglobin: 16.1 g/dL (ref 13.0–17.7)
Immature Grans (Abs): 0 10*3/uL (ref 0.0–0.1)
Immature Granulocytes: 1 %
Lymphocytes Absolute: 3.2 10*3/uL — ABNORMAL HIGH (ref 0.7–3.1)
Lymphs: 38 %
MCH: 26.9 pg (ref 26.6–33.0)
MCHC: 34 g/dL (ref 31.5–35.7)
MCV: 79 fL (ref 79–97)
Monocytes Absolute: 0.8 10*3/uL (ref 0.1–0.9)
Monocytes: 9 %
Neutrophils Absolute: 3.8 10*3/uL (ref 1.4–7.0)
Neutrophils: 46 %
Platelets: 294 10*3/uL (ref 150–450)
RBC: 5.98 x10E6/uL — ABNORMAL HIGH (ref 4.14–5.80)
RDW: 13.3 % (ref 11.6–15.4)
WBC: 8.3 10*3/uL (ref 3.4–10.8)

## 2019-04-10 LAB — BMP8+EGFR
BUN/Creatinine Ratio: 10 (ref 9–20)
BUN: 11 mg/dL (ref 6–24)
CO2: 25 mmol/L (ref 20–29)
Calcium: 9.9 mg/dL (ref 8.7–10.2)
Chloride: 99 mmol/L (ref 96–106)
Creatinine, Ser: 1.12 mg/dL (ref 0.76–1.27)
GFR calc Af Amer: 93 mL/min/{1.73_m2} (ref >59)
GFR calc non Af Amer: 80 mL/min/{1.73_m2} (ref >59)
Glucose: 98 mg/dL (ref 65–99)
Potassium: 4.5 mmol/L (ref 3.5–5.2)
Sodium: 140 mmol/L (ref 134–144)

## 2019-04-10 LAB — THYROID PANEL WITH TSH
Free Thyroxine Index: 1.6 (ref 1.2–4.9)
T3 Uptake Ratio: 21 % — ABNORMAL LOW (ref 24–39)
T4, Total: 7.7 ug/dL (ref 4.5–12.0)
TSH: 3.47 u[IU]/mL (ref 0.450–4.500)

## 2019-04-10 NOTE — Patient Outreach (Signed)
[facility]) Care Management  04/10/2019  Carl Bruce 1975/07/23 [id number]   Transition of care call/case closure   Referral received:04/03/19 Initial outreach:04/03/19 Insurance: [employer]   Subjective: 3rd  call to patient's preferred number in order to complete transition of care assessment; person answering call identified as patient wife, 2 HIPAA identifiers verified, she provided another   patient contact number and states he will probably not answer the  Phone.  She states that she is at work now. Placed call to patient contact number, no answer able to provide HIPAA compliant message for return call.    Objective:  Per the electronic medical record, Carl Bruce   was hospitalized at [facility]  From 2/23-2/15  With/for Chest pain , shortness of breath  . Comorbidities include:Marfans Syndrome, Aortic root repair, hypertension , hyperlipidemia .  He was discharged to home on 2/25/21without the need for home health services or durable medical equipment per the discharge summary   Plan f no return call from patient, will close case to [facility] Care Management services in 10 business days after initial post hospital discharge outreach, on  04/16/19   Carl Garibaldi, RN, BSN  [facility] Care Management,Care Management Coordinator  [phone]- Mobile [phone]- Toll Free Main Office .

## 2019-04-16 ENCOUNTER — Other Ambulatory Visit: Payer: Self-pay | Admitting: *Deleted

## 2019-04-16 NOTE — Patient Outreach (Signed)
[facility]) Care Management  04/16/2019  REIGN BARTNICK May 21, 1975 [id number]   Transition of care /Case Closure Unsuccessful outreach    Referral received:04/03/19 Initial outreach:04/03/19 Insurance: [employer]   Unable to complete post hospital discharge transition of care assessment. No return call form patient after 3 call attempts and no response to request to contact RN Care Coordinator in unsuccessful outreach letter mailed to home on 04/03/19.  Objective: Per the electronic medical record[facility] Chest pain , shortness of breath. Comorbidities include:Marfans Syndrome, Aortic root repair, hypertension , hyperlipidemia .He was discharged to home on 2/25/21without the need for home health services or durable medical equipment per the discharge summary  Plan Case closed to [facility] care management services as it has been 10 days since initial post discharge outreach attempt.   Egbert Garibaldi, RN, BSN  [facility] Care Management,Care Management Coordinator  [phone]- Mobile [phone]- Toll Free Main Office

## 2019-04-17 DIAGNOSIS — M25531 Pain in right wrist: Secondary | ICD-10-CM | POA: Diagnosis not present

## 2019-04-17 DIAGNOSIS — S55101A Unspecified injury of radial artery at forearm level, right arm, initial encounter: Secondary | ICD-10-CM | POA: Diagnosis not present

## 2019-05-05 ENCOUNTER — Other Ambulatory Visit: Payer: Self-pay | Admitting: *Deleted

## 2019-05-05 ENCOUNTER — Other Ambulatory Visit: Payer: Self-pay | Admitting: Nurse Practitioner

## 2019-05-05 DIAGNOSIS — F411 Generalized anxiety disorder: Secondary | ICD-10-CM

## 2019-05-05 DIAGNOSIS — E782 Mixed hyperlipidemia: Secondary | ICD-10-CM

## 2019-05-05 MED ORDER — CITALOPRAM HYDROBROMIDE 20 MG PO TABS: 20.0000 mg | ORAL_TABLET | Freq: Two times a day (BID) | ORAL | 0 refills | Status: DC

## 2019-05-05 MED ORDER — CITALOPRAM HYDROBROMIDE 20 MG PO TABS: 20.0000 mg | ORAL_TABLET | Freq: Two times a day (BID) | ORAL | 3 refills | Status: DC

## 2019-05-05 MED ORDER — ROSUVASTATIN CALCIUM 40 MG PO TABS: 40.0000 mg | ORAL_TABLET | Freq: Every day | ORAL | 0 refills | Status: DC

## 2019-05-05 MED ORDER — METOPROLOL TARTRATE 100 MG PO TABS: 100.0000 mg | ORAL_TABLET | Freq: Two times a day (BID) | ORAL | 3 refills | Status: DC

## 2019-05-05 MED ORDER — ROSUVASTATIN CALCIUM 40 MG PO TABS: 40.0000 mg | ORAL_TABLET | Freq: Every day | ORAL | 3 refills | Status: DC

## 2019-05-05 MED ORDER — AMLODIPINE BESYLATE 5 MG PO TABS: ORAL_TABLET | ORAL | 3 refills | Status: DC

## 2019-05-05 MED FILL — ROSUVASTATIN CALCIUM 40 MG: 40 | 90 days supply | Qty: 90 | Fill #0

## 2019-05-05 MED FILL — AMLODIPINE BESYLATE 5 MG TA: 5 | 90 days supply | Qty: 180 | Fill #0

## 2019-05-05 MED FILL — METOPROLOL TARTRATE 100 MG: 100 | 90 days supply | Qty: 180 | Fill #0

## 2019-05-05 MED FILL — CITALOPRAM HBR 20 MG TABLET: 20 | 90 days supply | Qty: 180 | Fill #0

## 2019-05-13 ENCOUNTER — Other Ambulatory Visit: Payer: Self-pay | Admitting: Nurse Practitioner

## 2019-05-13 MED ORDER — LANSOPRAZOLE 30 MG PO CPDR: 30.0000 mg | DELAYED_RELEASE_CAPSULE | Freq: Two times a day (BID) | ORAL | 3 refills | Status: DC

## 2019-05-19 DIAGNOSIS — L97521 Non-pressure chronic ulcer of other part of left foot limited to breakdown of skin: Secondary | ICD-10-CM | POA: Diagnosis not present

## 2019-06-04 DIAGNOSIS — L97521 Non-pressure chronic ulcer of other part of left foot limited to breakdown of skin: Secondary | ICD-10-CM | POA: Diagnosis not present

## 2019-06-09 ENCOUNTER — Other Ambulatory Visit: Payer: Self-pay | Admitting: Nurse Practitioner

## 2019-06-09 DIAGNOSIS — E782 Mixed hyperlipidemia: Secondary | ICD-10-CM

## 2019-06-11 DIAGNOSIS — M6281 Muscle weakness (generalized): Secondary | ICD-10-CM | POA: Diagnosis not present

## 2019-06-11 DIAGNOSIS — R110 Nausea: Secondary | ICD-10-CM | POA: Diagnosis not present

## 2019-06-11 DIAGNOSIS — K219 Gastro-esophageal reflux disease without esophagitis: Secondary | ICD-10-CM | POA: Diagnosis not present

## 2019-06-11 DIAGNOSIS — I252 Old myocardial infarction: Secondary | ICD-10-CM | POA: Diagnosis not present

## 2019-06-11 DIAGNOSIS — E785 Hyperlipidemia, unspecified: Secondary | ICD-10-CM | POA: Diagnosis not present

## 2019-06-11 DIAGNOSIS — U071 COVID-19: Secondary | ICD-10-CM | POA: Diagnosis not present

## 2019-06-11 DIAGNOSIS — R0602 Shortness of breath: Secondary | ICD-10-CM | POA: Diagnosis not present

## 2019-06-11 DIAGNOSIS — R52 Pain, unspecified: Secondary | ICD-10-CM | POA: Diagnosis not present

## 2019-06-11 DIAGNOSIS — I10 Essential (primary) hypertension: Secondary | ICD-10-CM | POA: Diagnosis not present

## 2019-06-25 DIAGNOSIS — L97521 Non-pressure chronic ulcer of other part of left foot limited to breakdown of skin: Secondary | ICD-10-CM | POA: Diagnosis not present

## 2019-07-16 DIAGNOSIS — L97521 Non-pressure chronic ulcer of other part of left foot limited to breakdown of skin: Secondary | ICD-10-CM | POA: Diagnosis not present

## 2019-07-20 ENCOUNTER — Other Ambulatory Visit: Payer: Self-pay

## 2019-07-20 ENCOUNTER — Ambulatory Visit (INDEPENDENT_AMBULATORY_CARE_PROVIDER_SITE_OTHER): Payer: 59 | Admitting: Nurse Practitioner

## 2019-07-20 ENCOUNTER — Encounter: Payer: Self-pay | Admitting: Nurse Practitioner

## 2019-07-20 VITALS — BP 99/53 | HR 60 | Resp 20 | Ht >= 80 in | Wt 320.0 lb

## 2019-07-20 DIAGNOSIS — R350 Frequency of micturition: Secondary | ICD-10-CM | POA: Diagnosis not present

## 2019-07-20 DIAGNOSIS — M5432 Sciatica, left side: Secondary | ICD-10-CM

## 2019-07-20 LAB — MICROSCOPIC EXAMINATION
Bacteria, UA: NONE SEEN
RBC, Urine: NONE SEEN /hpf (ref 0–2)
Renal Epithel, UA: NONE SEEN /hpf

## 2019-07-20 LAB — URINALYSIS, COMPLETE
Bilirubin, UA: NEGATIVE
Glucose, UA: NEGATIVE
Leukocytes,UA: NEGATIVE
Nitrite, UA: NEGATIVE
RBC, UA: NEGATIVE
Specific Gravity, UA: 1.025 (ref 1.005–1.030)
Urobilinogen, Ur: 0.2 mg/dL (ref 0.2–1.0)
pH, UA: 5.5 (ref 5.0–7.5)

## 2019-07-20 MED ORDER — METHYLPREDNISOLONE ACETATE 80 MG/ML IJ SUSP
80.0000 mg | Freq: Once | INTRAMUSCULAR | Status: AC
  Administered 2019-07-20: 80 mg via INTRAMUSCULAR

## 2019-07-20 MED ORDER — PREDNISONE 20 MG PO TABS: ORAL_TABLET | ORAL | 0 refills | Status: DC

## 2019-07-20 NOTE — Progress Notes (Signed)
   Subjective:    Patient ID: Carl Bruce, male    DOB: [birth year], 44 y.o.   MRN: [id number]   Chief Complaint: Back Pain   HPI Patient comes in today with 2 complaints: - patient comes in today c/o lower back pain that radiated down his left leg. Standing makes it worse. Started several months ago. He has tried motrin but has not helped. - urinary frequency and urgency. Has had nocturia for the last several nights.  Review of Systems  Constitutional: Negative for diaphoresis.  Eyes: Negative for pain.  Respiratory: Negative for shortness of breath.   Cardiovascular: Negative for chest pain, palpitations and leg swelling.  Gastrointestinal: Negative for abdominal pain.  Endocrine: Negative for polydipsia.  Skin: Negative for rash.  Neurological: Negative for dizziness, weakness and headaches.  Hematological: Does not bruise/bleed easily.  All other systems reviewed and are negative.      Objective:   Physical Exam Vitals and nursing note reviewed.  Constitutional:      Appearance: Normal appearance.  Cardiovascular:     Rate and Rhythm: Normal rate and regular rhythm.     Heart sounds: Normal heart sounds.  Pulmonary:     Breath sounds: Normal breath sounds.  Musculoskeletal:     Comments: Decreased ROM of lumbar spine with pain on rotation and flexion. (+) SLR on left Motor strength and sensation distally intact  Skin:    General: Skin is warm.  Neurological:     General: No focal deficit present.     Mental Status: He is alert and oriented to person, place, and time.  Psychiatric:        Mood and Affect: Mood normal.        Behavior: Behavior normal.    Urine clear BP (!) 99/53   Pulse 60   Resp 20   Ht 7' (2.134 m)   Wt (!) 320 lb (145.2 kg)   SpO2 95%   BMI 31.89 kg/m         Assessment & Plan:  Donnamarie Poag in today with chief complaint of Back Pain   1. Sciatica of left side Moist heat Rest rto prn - methylPREDNISolone acetate  (DEPO-MEDROL) injection 80 mg - predniSONE (DELTASONE) 20 MG tablet; 2 po at sametime daily for 5 days  Dispense: 10 tablet; Refill: 0  2. Urinary frequency - Urinalysis, Complete    The above assessment and management plan was discussed with the patient. The patient verbalized understanding of and has agreed to the management plan. Patient is aware to call the clinic if symptoms persist or worsen. Patient is aware when to return to the clinic for a follow-up visit. Patient educated on when it is appropriate to go to the emergency department.   Mary-Margaret Daphine Deutscher, FNP

## 2019-07-20 NOTE — Patient Instructions (Signed)
Sciatica  Sciatica is pain, weakness, tingling, or loss of feeling (numbness) along the sciatic nerve. The sciatic nerve starts in the lower back and goes down the back of each leg. Sciatica usually goes away on its own or with treatment. Sometimes, sciatica may come back (recur). What are the causes? This condition happens when the sciatic nerve is pinched or has pressure put on it. This may be the result of:  A disk in between the bones of the spine bulging out too far (herniated disk).  Changes in the spinal disks that occur with aging.  A condition that affects a muscle in the butt.  Extra bone growth near the sciatic nerve.  A break (fracture) of the area between your hip bones (pelvis).  Pregnancy.  Tumor. This is rare. What increases the risk? You are more likely to develop this condition if you:  Play sports that put pressure or stress on the spine.  Have poor strength and ease of movement (flexibility).  Have had a back injury in the past.  Have had back surgery.  Sit for long periods of time.  Do activities that involve bending or lifting over and over again.  Are very overweight (obese). What are the signs or symptoms? Symptoms can vary from mild to very bad. They may include:  Any of these problems in the lower back, leg, hip, or butt: ? Mild tingling, loss of feeling, or dull aches. ? Burning sensations. ? Sharp pains.  Loss of feeling in the back of the calf or the sole of the foot.  Leg weakness.  Very bad back pain that makes it hard to move. These symptoms may get worse when you cough, sneeze, or laugh. They may also get worse when you sit or stand for long periods of time. How is this treated? This condition often gets better without any treatment. However, treatment may include:  Changing or cutting back on physical activity when you have pain.  Doing exercises and stretching.  Putting ice or heat on the affected area.  Medicines that  help: ? To relieve pain and swelling. ? To relax your muscles.  Shots (injections) of medicines that help to relieve pain, irritation, and swelling.  Surgery. Follow these instructions at home: Medicines  Take over-the-counter and prescription medicines only as told by your doctor.  Ask your doctor if the medicine prescribed to you: ? Requires you to avoid driving or using heavy machinery. ? Can cause trouble pooping (constipation). You may need to take these steps to prevent or treat trouble pooping:  Drink enough fluids to keep your pee (urine) pale yellow.  Take over-the-counter or prescription medicines.  Eat foods that are high in fiber. These include beans, whole grains, and fresh fruits and vegetables.  Limit foods that are high in fat and sugar. These include fried or sweet foods. Managing pain      If told, put ice on the affected area. ? Put ice in a plastic bag. ? Place a towel between your skin and the bag. ? Leave the ice on for 20 minutes, 2-3 times a day.  If told, put heat on the affected area. Use the heat source that your doctor tells you to use, such as a moist heat pack or a heating pad. ? Place a towel between your skin and the heat source. ? Leave the heat on for 20-30 minutes. ? Remove the heat if your skin turns bright red. This is very important if you are   unable to feel pain, heat, or cold. You may have a greater risk of getting burned. Activity   Return to your normal activities as told by your doctor. Ask your doctor what activities are safe for you.  Avoid activities that make your symptoms worse.  Take short rests during the day. ? When you rest for a long time, do some physical activity or stretching between periods of rest. ? Avoid sitting for a long time without moving. Get up and move around at least one time each hour.  Exercise and stretch regularly, as told by your doctor.  Do not lift anything that is heavier than 10 lb (4.5 kg)  while you have symptoms of sciatica. ? Avoid lifting heavy things even when you do not have symptoms. ? Avoid lifting heavy things over and over.  When you lift objects, always lift in a way that is safe for your body. To do this, you should: ? Bend your knees. ? Keep the object close to your body. ? Avoid twisting. General instructions  Stay at a healthy weight.  Wear comfortable shoes that support your feet. Avoid wearing high heels.  Avoid sleeping on a mattress that is too soft or too hard. You might have less pain if you sleep on a mattress that is firm enough to support your back.  Keep all follow-up visits as told by your doctor. This is important. Contact a doctor if:  You have pain that: ? Wakes you up when you are sleeping. ? Gets worse when you lie down. ? Is worse than the pain you have had in the past. ? Lasts longer than 4 weeks.  You lose weight without trying. Get help right away if:  You cannot control when you pee (urinate) or poop (have a bowel movement).  You have weakness in any of these areas and it gets worse: ? Lower back. ? The area between your hip bones. ? Butt. ? Legs.  You have redness or swelling of your back.  You have a burning feeling when you pee. Summary  Sciatica is pain, weakness, tingling, or loss of feeling (numbness) along the sciatic nerve.  This condition happens when the sciatic nerve is pinched or has pressure put on it.  Sciatica can cause pain, tingling, or loss of feeling (numbness) in the lower back, legs, hips, and butt.  Treatment often includes rest, exercise, medicines, and putting ice or heat on the affected area. This information is not intended to replace advice given to you by your health care provider. Make sure you discuss any questions you have with your health care provider. Document Revised: 02/10/2018 Document Reviewed: 02/10/2018 Elsevier Patient Education  2020 Elsevier Inc.  

## 2019-07-20 NOTE — Addendum Note (Signed)
Addended by: Quay Burow on: 07/20/2019 02:32 PM   Modules accepted: Orders

## 2019-07-23 ENCOUNTER — Other Ambulatory Visit: Payer: Self-pay | Admitting: Nurse Practitioner

## 2019-07-23 LAB — URINE CULTURE

## 2019-07-23 MED ORDER — SULFAMETHOXAZOLE-TRIMETHOPRIM 800-160 MG PO TABS
1.0000 | ORAL_TABLET | Freq: Two times a day (BID) | ORAL | 0 refills | Status: DC
Start: 2019-07-23 — End: 2019-09-25

## 2019-08-01 ENCOUNTER — Other Ambulatory Visit: Payer: Self-pay | Admitting: Nurse Practitioner

## 2019-08-01 MED ORDER — DOXYCYCLINE HYCLATE 100 MG PO TABS
100.0000 mg | ORAL_TABLET | Freq: Two times a day (BID) | ORAL | 0 refills | Status: DC
Start: 2019-08-01 — End: 2019-09-25

## 2019-08-13 DIAGNOSIS — L97521 Non-pressure chronic ulcer of other part of left foot limited to breakdown of skin: Secondary | ICD-10-CM | POA: Diagnosis not present

## 2019-08-17 MED FILL — CITALOPRAM HBR 20 MG TABLET: 20 | 90 days supply | Qty: 180 | Fill #1

## 2019-08-17 MED FILL — ROSUVASTATIN CALCIUM 40 MG: 40 | 90 days supply | Qty: 90 | Fill #1

## 2019-08-17 MED FILL — METOPROLOL TARTRATE 100 MG: 100 | 90 days supply | Qty: 180 | Fill #1

## 2019-08-17 MED FILL — AMLODIPINE BESYLATE 5 MG TA: 5 | 90 days supply | Qty: 180 | Fill #1

## 2019-09-18 ENCOUNTER — Other Ambulatory Visit: Payer: Self-pay | Admitting: *Deleted

## 2019-09-18 MED ORDER — DOXYCYCLINE HYCLATE 100 MG PO TABS
100.0000 mg | ORAL_TABLET | Freq: Two times a day (BID) | ORAL | 0 refills | Status: DC
Start: 2019-09-18 — End: 2019-09-25

## 2019-09-21 ENCOUNTER — Other Ambulatory Visit: Payer: Self-pay | Admitting: *Deleted

## 2019-09-21 ENCOUNTER — Other Ambulatory Visit: Payer: 59

## 2019-09-21 DIAGNOSIS — R350 Frequency of micturition: Secondary | ICD-10-CM | POA: Diagnosis not present

## 2019-09-21 DIAGNOSIS — N390 Urinary tract infection, site not specified: Secondary | ICD-10-CM

## 2019-09-21 LAB — URINALYSIS, COMPLETE
Bilirubin, UA: NEGATIVE
Glucose, UA: NEGATIVE
Leukocytes,UA: NEGATIVE
Nitrite, UA: POSITIVE — AB
RBC, UA: NEGATIVE
Specific Gravity, UA: >1.03 — ABNORMAL HIGH (ref 1.005–1.030)
Urobilinogen, Ur: 1 mg/dL (ref 0.2–1.0)
pH, UA: 6 (ref 5.0–7.5)

## 2019-09-21 LAB — MICROSCOPIC EXAMINATION: RBC, Urine: NONE SEEN /hpf (ref 0–2)

## 2019-09-23 LAB — URINE CULTURE

## 2019-09-24 DIAGNOSIS — Q8740 Marfan's syndrome, unspecified: Secondary | ICD-10-CM | POA: Diagnosis not present

## 2019-09-24 DIAGNOSIS — Z95828 Presence of other vascular implants and grafts: Secondary | ICD-10-CM | POA: Diagnosis not present

## 2019-09-25 ENCOUNTER — Other Ambulatory Visit: Payer: Self-pay | Admitting: Nurse Practitioner

## 2019-09-25 MED ORDER — SULFAMETHOXAZOLE-TRIMETHOPRIM 800-160 MG PO TABS: 1.0000 | ORAL_TABLET | Freq: Two times a day (BID) | ORAL | 0 refills | Status: DC

## 2019-10-20 DIAGNOSIS — L97521 Non-pressure chronic ulcer of other part of left foot limited to breakdown of skin: Secondary | ICD-10-CM | POA: Diagnosis not present

## 2019-11-03 ENCOUNTER — Other Ambulatory Visit: Payer: Self-pay | Admitting: Nurse Practitioner

## 2019-11-03 DIAGNOSIS — R300 Dysuria: Secondary | ICD-10-CM

## 2019-11-03 MED ORDER — NITROFURANTOIN MONOHYD MACRO 100 MG PO CAPS: 100.0000 mg | ORAL_CAPSULE | Freq: Two times a day (BID) | ORAL | 0 refills | Status: DC

## 2019-11-04 ENCOUNTER — Other Ambulatory Visit: Payer: 59

## 2019-11-04 DIAGNOSIS — N390 Urinary tract infection, site not specified: Secondary | ICD-10-CM | POA: Diagnosis not present

## 2019-11-04 DIAGNOSIS — R52 Pain, unspecified: Secondary | ICD-10-CM | POA: Diagnosis not present

## 2019-11-04 DIAGNOSIS — R300 Dysuria: Secondary | ICD-10-CM

## 2019-11-04 DIAGNOSIS — I252 Old myocardial infarction: Secondary | ICD-10-CM | POA: Diagnosis not present

## 2019-11-04 DIAGNOSIS — R509 Fever, unspecified: Secondary | ICD-10-CM | POA: Diagnosis not present

## 2019-11-04 DIAGNOSIS — I10 Essential (primary) hypertension: Secondary | ICD-10-CM | POA: Diagnosis not present

## 2019-11-04 DIAGNOSIS — E785 Hyperlipidemia, unspecified: Secondary | ICD-10-CM | POA: Diagnosis not present

## 2019-11-04 LAB — URINALYSIS, COMPLETE
Bilirubin, UA: NEGATIVE
Glucose, UA: NEGATIVE
Ketones, UA: NEGATIVE
Nitrite, UA: NEGATIVE
Protein,UA: NEGATIVE
Specific Gravity, UA: 1.02 (ref 1.005–1.030)
Urobilinogen, Ur: 0.2 mg/dL (ref 0.2–1.0)
pH, UA: 8 — ABNORMAL HIGH (ref 5.0–7.5)

## 2019-11-04 LAB — MICROSCOPIC EXAMINATION: Crystals: POSITIVE — AB

## 2019-11-05 ENCOUNTER — Other Ambulatory Visit: Payer: Self-pay

## 2019-11-05 ENCOUNTER — Other Ambulatory Visit: Payer: Self-pay | Admitting: Nurse Practitioner

## 2019-11-05 ENCOUNTER — Other Ambulatory Visit: Payer: Self-pay | Admitting: *Deleted

## 2019-11-05 ENCOUNTER — Ambulatory Visit (HOSPITAL_COMMUNITY)
Admission: RE | Admit: 2019-11-05 | Discharge: 2019-11-05 | Disposition: A | Discharge status: Home / Self Care | Payer: 59 | Source: Ambulatory Visit | Attending: Nurse Practitioner | Admitting: Nurse Practitioner

## 2019-11-05 DIAGNOSIS — R109 Unspecified abdominal pain: Secondary | ICD-10-CM | POA: Diagnosis not present

## 2019-11-05 DIAGNOSIS — R110 Nausea: Secondary | ICD-10-CM | POA: Diagnosis not present

## 2019-11-05 DIAGNOSIS — I700 Atherosclerosis of aorta: Secondary | ICD-10-CM | POA: Diagnosis not present

## 2019-11-05 IMAGING — CT CT RENAL STONE PROTOCOL
2 of 4 series · 17 of 46 positions shown, 19 images · non-contrast
Comparison: None.

CLINICAL DATA: Flank pain and nausea for 3 months.

EXAM:
CT ABDOMEN AND PELVIS WITHOUT CONTRAST
TECHNIQUE: Multidetector CT imaging of the abdomen and pelvis was performed
following the standard protocol without IV contrast.

[Series 2: axial st · axial · 0.96mm/px · z∈[+689,+1169]mm · 14 of 108 slices shown, 16 images]
[im 6/108  soft-tissue]
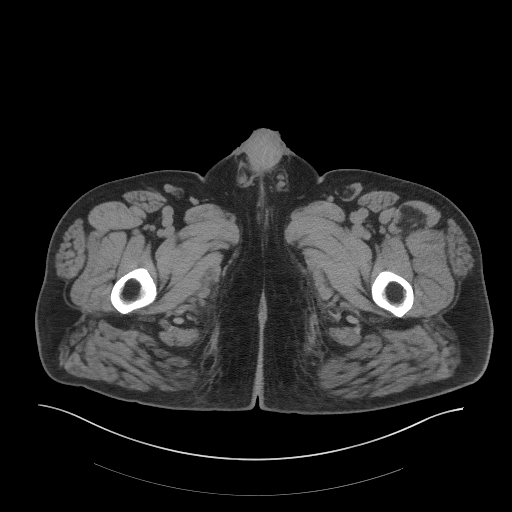
[im 6/108  bone]
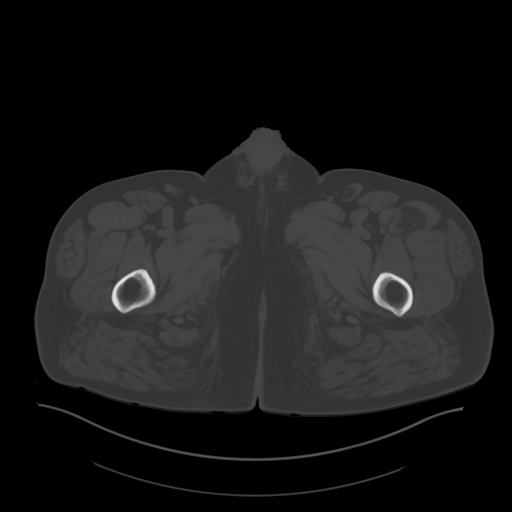
[im 12/108  soft-tissue]
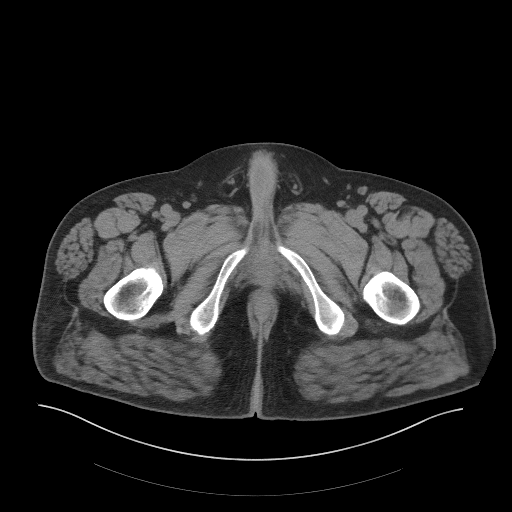
[im 24/108  soft-tissue]
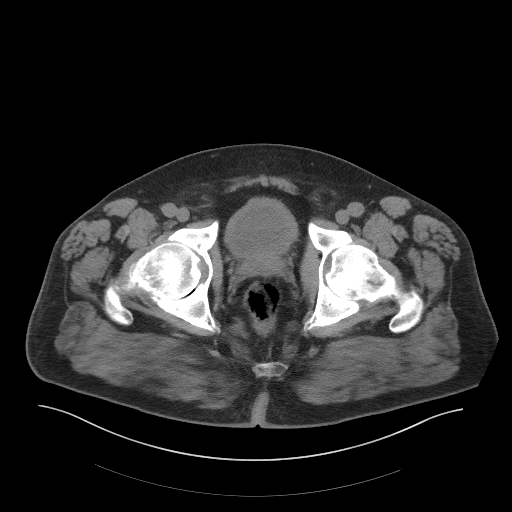
[im 30/108  soft-tissue]
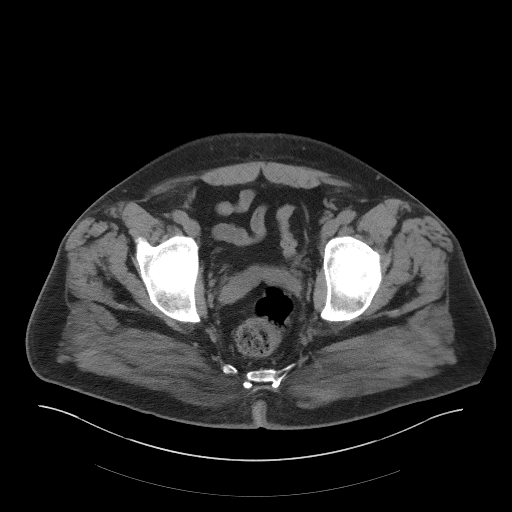
[im 36/108  soft-tissue]
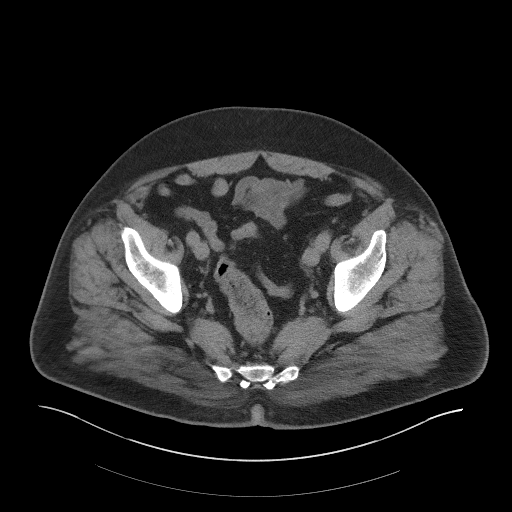
[im 42/108  soft-tissue]
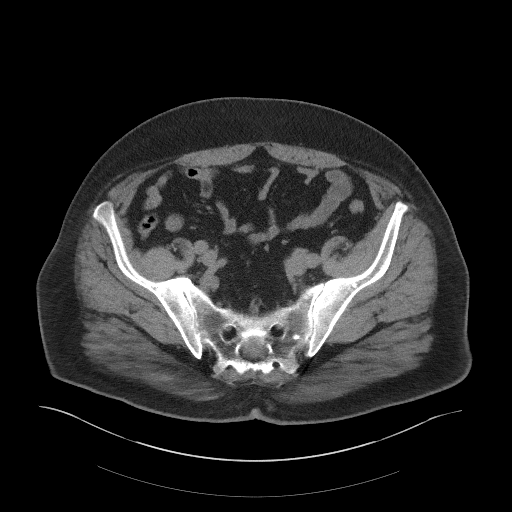
[im 48/108  soft-tissue]
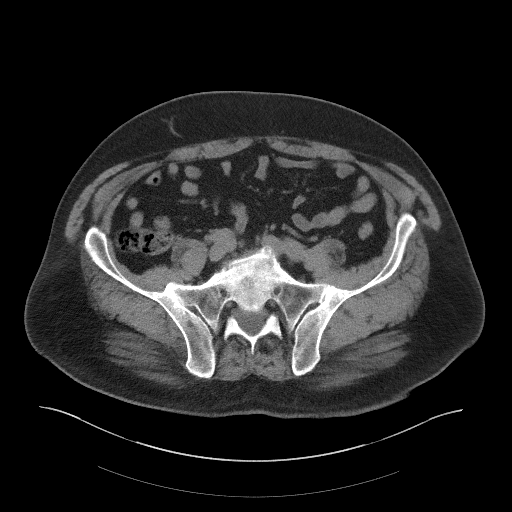
[im 60/108  soft-tissue]
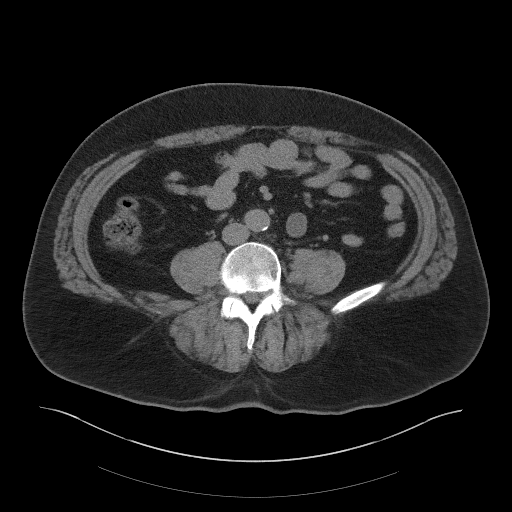
[im 66/108  soft-tissue]
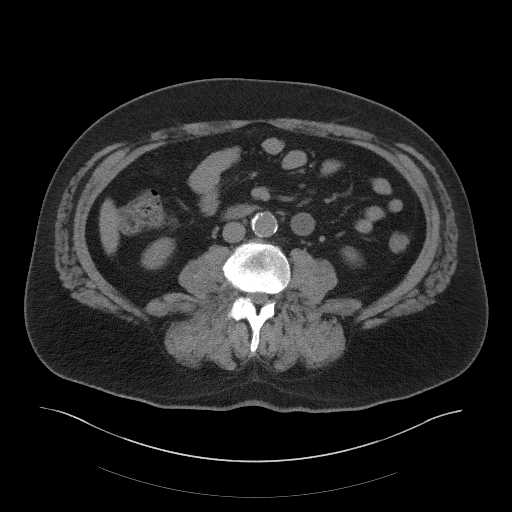
[im 66/108  bone]
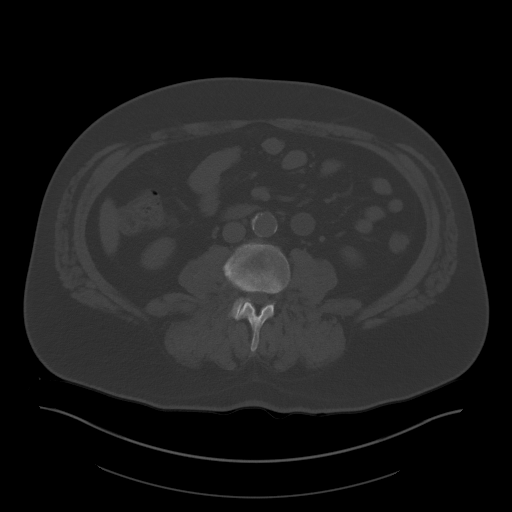
[im 72/108  soft-tissue]
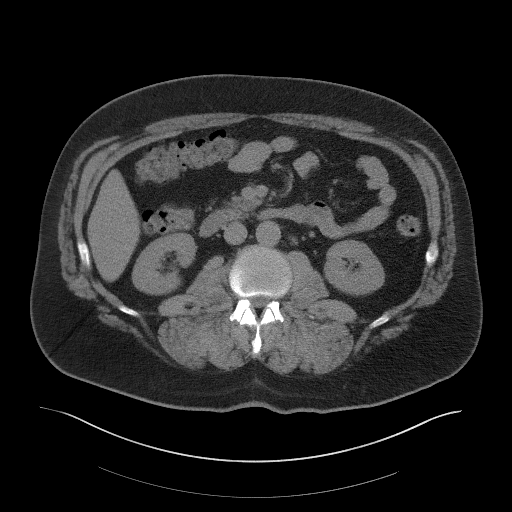
[im 78/108  soft-tissue]
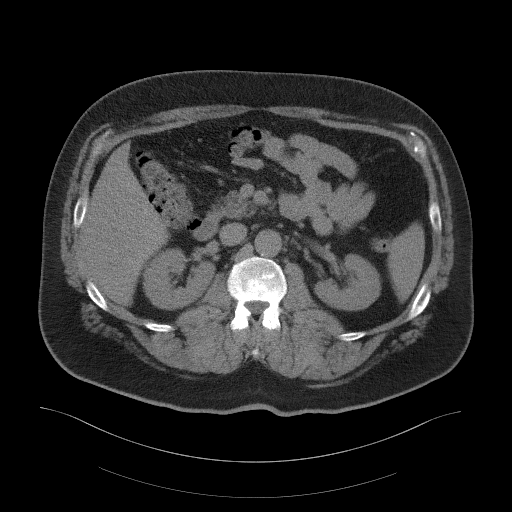
[im 84/108  soft-tissue]
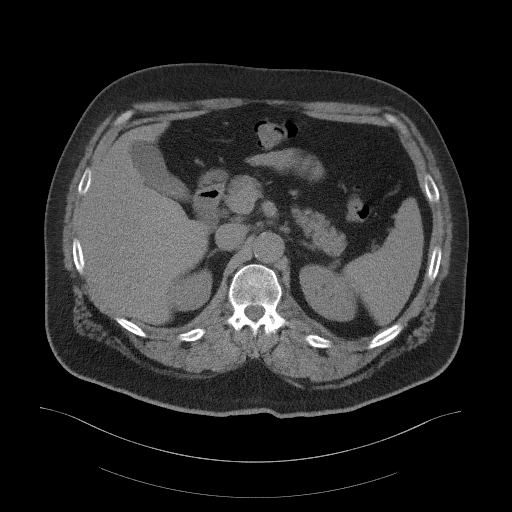
[im 96/108  soft-tissue]
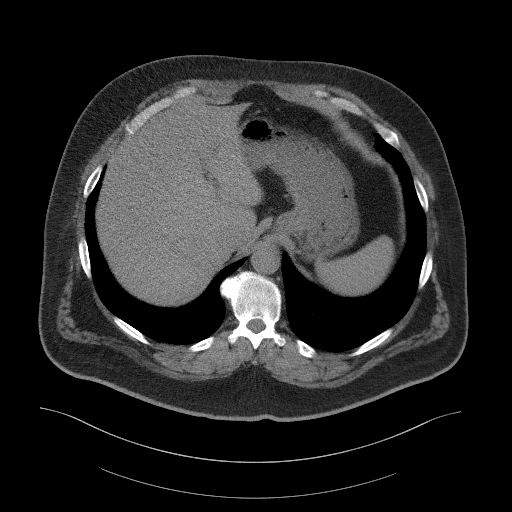
[im 102/108  soft-tissue]
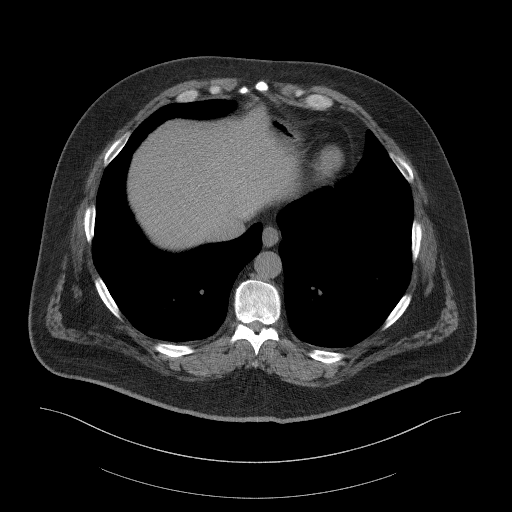

[Series 5: coronal st · coronal · 0.90mm/px · 3 of 117 slices shown]
[im 39/117  soft-tissue]
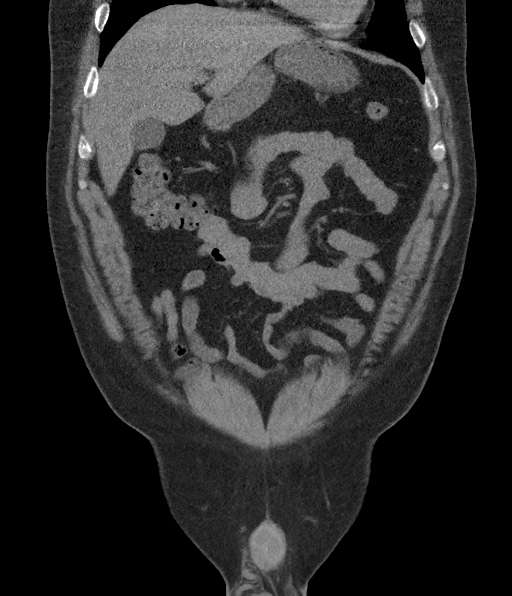
[im 52/117  soft-tissue]
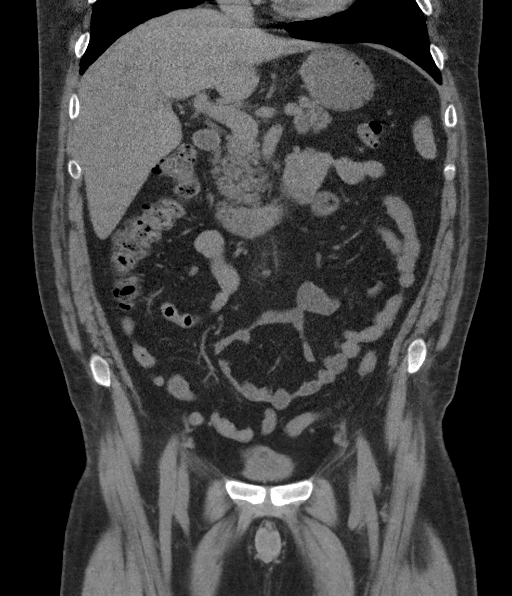
[im 65/117  soft-tissue]
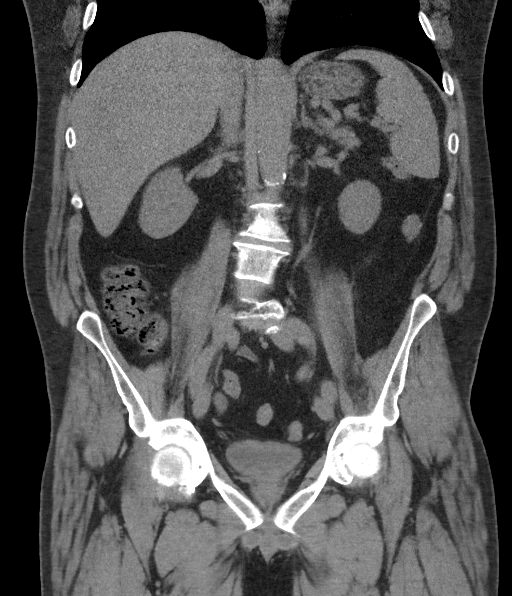

[17 of 46 positions shown; findings below may reference images not displayed]

FINDINGS: Lower chest: No acute findings.

Hepatobiliary: No mass visualized on this unenhanced exam.
Gallbladder is unremarkable. No evidence of biliary ductal
dilatation.

Pancreas: No mass or inflammatory process visualized on this
unenhanced exam.

Spleen:  Within normal limits in size.

Adrenals/Urinary tract: No evidence of urolithiasis or
hydronephrosis. Unremarkable unopacified urinary bladder.

Stomach/Bowel: No evidence of obstruction, inflammatory process, or
abnormal fluid collections. Normal appendix visualized.

Vascular/Lymphatic: No pathologically enlarged lymph nodes
identified. No evidence of abdominal aortic aneurysm. Aortic
atherosclerosis noted.

Reproductive:  No mass or other significant abnormality.

Other:  None.

Musculoskeletal:  No suspicious bone lesions identified.
IMPRESSION: No evidence of urolithiasis, hydronephrosis, or other acute
findings.

Aortic Atherosclerosis ([B2]-[B2]).

## 2019-11-08 LAB — URINE CULTURE

## 2019-11-19 ENCOUNTER — Other Ambulatory Visit: Payer: Self-pay | Admitting: *Deleted

## 2019-11-19 ENCOUNTER — Other Ambulatory Visit: Payer: Self-pay | Admitting: Family Medicine

## 2019-11-19 ENCOUNTER — Other Ambulatory Visit: Payer: 59

## 2019-11-19 DIAGNOSIS — R300 Dysuria: Secondary | ICD-10-CM

## 2019-11-19 DIAGNOSIS — N390 Urinary tract infection, site not specified: Secondary | ICD-10-CM

## 2019-11-19 DIAGNOSIS — R52 Pain, unspecified: Secondary | ICD-10-CM

## 2019-11-19 LAB — MICROSCOPIC EXAMINATION: RBC, Urine: NONE SEEN /hpf (ref 0–2)

## 2019-11-19 LAB — URINALYSIS, COMPLETE
Bilirubin, UA: NEGATIVE
Glucose, UA: NEGATIVE
Ketones, UA: NEGATIVE
Leukocytes,UA: NEGATIVE
Nitrite, UA: NEGATIVE
RBC, UA: NEGATIVE
Specific Gravity, UA: 1.025 (ref 1.005–1.030)
Urobilinogen, Ur: 0.2 mg/dL (ref 0.2–1.0)
pH, UA: 6.5 (ref 5.0–7.5)

## 2019-11-19 MED ORDER — NITROFURANTOIN MONOHYD MACRO 100 MG PO CAPS: 100.0000 mg | ORAL_CAPSULE | Freq: Two times a day (BID) | ORAL | 0 refills | Status: DC

## 2019-11-20 LAB — NOVEL CORONAVIRUS, NAA: SARS-CoV-2, NAA: NOT DETECTED

## 2019-11-20 LAB — SARS-COV-2, NAA 2 DAY TAT

## 2019-11-24 LAB — URINE CULTURE

## 2019-12-02 ENCOUNTER — Other Ambulatory Visit: Payer: Self-pay | Admitting: Nurse Practitioner

## 2019-12-02 ENCOUNTER — Other Ambulatory Visit: Payer: Self-pay | Admitting: *Deleted

## 2019-12-02 DIAGNOSIS — F411 Generalized anxiety disorder: Secondary | ICD-10-CM

## 2019-12-02 DIAGNOSIS — E782 Mixed hyperlipidemia: Secondary | ICD-10-CM

## 2019-12-02 MED ORDER — METOPROLOL TARTRATE 100 MG PO TABS
100.0000 mg | ORAL_TABLET | Freq: Two times a day (BID) | ORAL | 3 refills | Status: DC
Start: 2019-12-02 — End: 2019-12-02

## 2019-12-02 MED ORDER — CITALOPRAM HYDROBROMIDE 20 MG PO TABS: 20.0000 mg | ORAL_TABLET | Freq: Two times a day (BID) | ORAL | 3 refills | Status: DC

## 2019-12-02 MED ORDER — ROSUVASTATIN CALCIUM 40 MG PO TABS: 40.0000 mg | ORAL_TABLET | Freq: Every day | ORAL | 3 refills | Status: DC

## 2019-12-02 MED ORDER — LOSARTAN POTASSIUM 25 MG PO TABS
25.0000 mg | ORAL_TABLET | Freq: Two times a day (BID) | ORAL | 3 refills | Status: DC
Start: 2019-12-02 — End: 2019-12-02

## 2019-12-02 MED FILL — METOPROLOL TARTRATE 100 MG: 100 | 90 days supply | Qty: 180 | Fill #0

## 2019-12-02 MED FILL — ROSUVASTATIN CALCIUM 40 MG: 40 | 90 days supply | Qty: 90 | Fill #0

## 2019-12-02 MED FILL — LOSARTAN POTASSIUM 25 MG TA: 25 | 90 days supply | Qty: 180 | Fill #0

## 2019-12-02 MED FILL — CITALOPRAM HBR 20 MG TABLET: 20 | 90 days supply | Qty: 180 | Fill #0

## 2019-12-11 ENCOUNTER — Other Ambulatory Visit: Payer: Self-pay | Admitting: Nurse Practitioner

## 2019-12-11 ENCOUNTER — Other Ambulatory Visit: Payer: 59

## 2019-12-11 ENCOUNTER — Other Ambulatory Visit: Payer: Self-pay

## 2019-12-11 DIAGNOSIS — M791 Myalgia, unspecified site: Secondary | ICD-10-CM | POA: Diagnosis not present

## 2019-12-11 DIAGNOSIS — R5081 Fever presenting with conditions classified elsewhere: Secondary | ICD-10-CM | POA: Diagnosis not present

## 2019-12-12 LAB — NOVEL CORONAVIRUS, NAA: SARS-CoV-2, NAA: NOT DETECTED

## 2019-12-12 LAB — SARS-COV-2, NAA 2 DAY TAT

## 2019-12-22 DIAGNOSIS — L97521 Non-pressure chronic ulcer of other part of left foot limited to breakdown of skin: Secondary | ICD-10-CM | POA: Diagnosis not present

## 2019-12-28 ENCOUNTER — Other Ambulatory Visit: Payer: Self-pay | Admitting: Nurse Practitioner

## 2019-12-28 DIAGNOSIS — R300 Dysuria: Secondary | ICD-10-CM

## 2019-12-28 MED ORDER — NITROFURANTOIN MONOHYD MACRO 100 MG PO CAPS: 100.0000 mg | ORAL_CAPSULE | Freq: Two times a day (BID) | ORAL | 0 refills | Status: DC

## 2020-01-05 ENCOUNTER — Other Ambulatory Visit: Payer: Self-pay

## 2020-01-05 ENCOUNTER — Encounter: Payer: Self-pay | Admitting: Urology

## 2020-01-05 ENCOUNTER — Ambulatory Visit (INDEPENDENT_AMBULATORY_CARE_PROVIDER_SITE_OTHER): Payer: 59 | Admitting: Urology

## 2020-01-05 VITALS — BP 153/83 | HR 57 | Temp 97.9°F | Ht >= 80 in | Wt 320.0 lb

## 2020-01-05 DIAGNOSIS — N410 Acute prostatitis: Secondary | ICD-10-CM | POA: Diagnosis not present

## 2020-01-05 DIAGNOSIS — Z87440 Personal history of urinary (tract) infections: Secondary | ICD-10-CM | POA: Diagnosis not present

## 2020-01-05 DIAGNOSIS — Z7689 Persons encountering health services in other specified circumstances: Secondary | ICD-10-CM | POA: Diagnosis not present

## 2020-01-05 LAB — URINALYSIS, ROUTINE W REFLEX MICROSCOPIC
Bilirubin, UA: NEGATIVE
Glucose, UA: NEGATIVE
Ketones, UA: NEGATIVE
Leukocytes,UA: NEGATIVE
Nitrite, UA: NEGATIVE
Protein,UA: NEGATIVE
RBC, UA: NEGATIVE
Specific Gravity, UA: >1.03 — ABNORMAL HIGH (ref 1.005–1.030)
Urobilinogen, Ur: 0.2 mg/dL (ref 0.2–1.0)
pH, UA: 5.5 (ref 5.0–7.5)

## 2020-01-05 MED ORDER — CEFPODOXIME PROXETIL 200 MG PO TABS: 200.0000 mg | ORAL_TABLET | Freq: Two times a day (BID) | ORAL | 0 refills | Status: DC
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
## 2020-01-05 NOTE — Progress Notes (Signed)
Urological Symptom Review  Patient is experiencing the following symptoms: Hard to postpone urination Burning/pain with urination Get up at night to urinate Leakage of urine Stream starts and stops Trouble starting stream Have to strain to urinate Blood in urine Urinary tract infection Weak stream Penile pain (male only)    Review of Systems  Gastrointestinal (upper)  : Indigestion/heartburn  Gastrointestinal (lower) : Negative for lower GI symptoms  Constitutional : Negative for symptoms  Skin: Negative for skin symptoms  Eyes: Negative for eye symptoms  Ear/Nose/Throat : Negative for Ear/Nose/Throat symptoms  Hematologic/Lymphatic: Negative for Hematologic/Lymphatic symptoms  Cardiovascular : Negative for cardiovascular symptoms  Respiratory : Negative for respiratory symptoms  Endocrine: Negative for endocrine symptoms  Musculoskeletal: Back pain  Neurological: Negative for neurological symptoms  Psychologic: Depression

## 2020-01-07 LAB — URINE CULTURE: Organism ID, Bacteria: NO GROWTH

## 2020-01-08 ENCOUNTER — Telehealth: Payer: Self-pay

## 2020-01-08 NOTE — Telephone Encounter (Signed)
Pt made aware to finish abx. Said what if he had felt like he had the flu since he had been on them. I told him if he felt they were making him to sick then stop taking.

## 2020-01-08 NOTE — Telephone Encounter (Signed)
-----   Message from Marcine Matar, MD sent at 01/07/2020 11:57 AM EST ----- Notify patient that his culture was negative would continue antibiotics as prescribed ----- Message ----- From: Dalia Heading, LPN Sent: 56/05/3327   8:39 AM EST To: Marcine Matar, MD  Pls review

## 2020-01-11 ENCOUNTER — Encounter: Payer: Self-pay | Admitting: Urology

## 2020-01-12 ENCOUNTER — Other Ambulatory Visit: Payer: Self-pay | Admitting: Urology

## 2020-01-12 NOTE — Telephone Encounter (Signed)
OK--then we arew a bit backed into a corner regarding pt's allergies and abx sensitivities. Have him stop vantin then. If sx's come back he can try fosfomycin 3 gm packs 1 q 3 days # 15 (for 45 days of Rx). I can't put that in d/t fact that it is only a clinic administered drug in epic.

## 2020-01-13 ENCOUNTER — Telehealth: Payer: Self-pay

## 2020-01-13 ENCOUNTER — Other Ambulatory Visit: Payer: Self-pay

## 2020-01-13 DIAGNOSIS — N410 Acute prostatitis: Secondary | ICD-10-CM

## 2020-01-13 MED ORDER — FOSFOMYCIN TROMETHAMINE 3 G PO PACK: 3.0000 g | PACK | ORAL | 0 refills | Status: DC

## 2020-01-13 MED FILL — FOSFOMYCIN TROMETHAMINE 3 G: 3 | 45 days supply | Qty: 15 | Fill #0

## 2020-01-13 NOTE — Telephone Encounter (Signed)
Wife notified of rx of fosfomycin 3gm pack 1 pack every 3 days. Dispensing 15 doses total of 45 days.   Pt will stop vantin as per Dr. Retta Diones

## 2020-02-09 ENCOUNTER — Other Ambulatory Visit: Payer: Self-pay | Admitting: *Deleted

## 2020-02-09 MED ORDER — AMOXICILLIN 500 MG PO CAPS: ORAL_CAPSULE | ORAL | 0 refills | Status: DC

## 2020-02-15 ENCOUNTER — Encounter: Payer: Self-pay | Admitting: Nurse Practitioner

## 2020-02-15 ENCOUNTER — Other Ambulatory Visit: Payer: Self-pay

## 2020-02-15 ENCOUNTER — Ambulatory Visit (INDEPENDENT_AMBULATORY_CARE_PROVIDER_SITE_OTHER): Payer: 59 | Admitting: Nurse Practitioner

## 2020-02-15 VITALS — BP 154/90 | HR 74 | Temp 97.2°F | Resp 20 | Ht >= 80 in | Wt 320.0 lb

## 2020-02-15 DIAGNOSIS — M25561 Pain in right knee: Secondary | ICD-10-CM

## 2020-02-15 MED ORDER — BUPIVACAINE HCL 0.25 % IJ SOLN
1.0000 mL | Freq: Once | INTRAMUSCULAR | Status: AC
  Administered 2020-02-15: 1 mL via INTRA_ARTICULAR

## 2020-02-15 MED ORDER — METHYLPREDNISOLONE ACETATE 40 MG/ML IJ SUSP
40.0000 mg | Freq: Once | INTRAMUSCULAR | Status: AC
  Administered 2020-02-15: 40 mg via INTRAMUSCULAR

## 2020-02-15 NOTE — Progress Notes (Signed)
   Subjective:    Patient ID: Carl Bruce, male    DOB: 31-Oct-1975, 45 y.o.   MRN: [id number]   Chief Complaint: knee pain  HPI Patient comes in today c/o right  knee pain. He has had knee issues off and on for many years due to being so tall. He has recently gone back to work and is getting up and down out of th e floor a lot which has increased his knee pain. He would like an injection today.   Review of Systems  Constitutional: Negative for diaphoresis.  Eyes: Negative for pain.  Respiratory: Negative for shortness of breath.   Cardiovascular: Negative for chest pain, palpitations and leg swelling.  Gastrointestinal: Negative for abdominal pain.  Endocrine: Negative for polydipsia.  Musculoskeletal: Positive for arthralgias (right knee pain).  Skin: Negative for rash.  Neurological: Negative for dizziness, weakness and headaches.  Hematological: Does not bruise/bleed easily.  All other systems reviewed and are negative.      Objective:   Physical Exam Vitals and nursing note reviewed.  Constitutional:      Appearance: Normal appearance.  Cardiovascular:     Rate and Rhythm: Normal rate and regular rhythm.     Heart sounds: Normal heart sounds.  Pulmonary:     Breath sounds: Normal breath sounds.  Musculoskeletal:     Comments: FROM of right knee with pain on flexion Mild effusion noted  Neurological:     Mental Status: He is alert.      Joint Injection/Arthrocentesis  Date/Time: 02/15/2020 3:55 PM Performed by: Bennie Pierini, FNP Authorized by: Bennie Pierini, FNP  Indications: pain  Body area: knee Joint: right knee Local anesthesia used: yes  Anesthesia: Local anesthesia used: yes Local Anesthetic: co-phenylcaine spray  Sedation: Patient sedated: no  Preparation: Patient was prepped and draped in the usual sterile fashion. Needle size: 22 G Ultrasound guidance: no Approach: inferior Aspirate amount: 0 mL Methylprednisolone  amount: 40 mg Bupivacaine 0.25% amount: 1 mL Patient tolerance: patient tolerated the procedure well with no immediate complications   BP (!) 154/90   Pulse 74   Temp (!) 97.2 F (36.2 C) (Temporal)   Resp 20   Ht 7' (2.134 m)   Wt (!) 320 lb (145.2 kg)   SpO2 95%   BMI 31.89 kg/m        Assessment & Plan:  Carl Bruce in today with chief complaint of Knee Pain (Right/)   1. Acute pain of right knee Ice bid Rest Wear compression sleeve RTO prn - methylPREDNISolone acetate (DEPO-MEDROL) injection 40 mg - bupivacaine (MARCAINE) 0.25 % (with pres) injection 1 mL    The above assessment and management plan was discussed with the patient. The patient verbalized understanding of and has agreed to the management plan. Patient is aware to call the clinic if symptoms persist or worsen. Patient is aware when to return to the clinic for a follow-up visit. Patient educated on when it is appropriate to go to the emergency department.   Mary-Margaret Daphine Deutscher, FNP

## 2020-02-15 NOTE — Patient Instructions (Signed)
Acute Knee Pain, Adult Many things can cause knee pain. Sometimes, knee pain is sudden (acute) and may be caused by damage, swelling, or irritation of the muscles and tissues that support your knee. The pain often goes away on its own with time and rest. If the pain does not go away, tests may be done to find out what is causing the pain. Follow these instructions at home: If you have a knee sleeve or brace:  Wear the knee sleeve or brace as told by your doctor. Take it off only as told by your doctor.  Loosen it if your toes: ? Tingle. ? Become numb. ? Turn cold and blue.  Keep it clean.  If the knee sleeve or brace is not waterproof: ? Do not let it get wet. ? Cover it with a watertight covering when you take a bath or shower.   Activity  Rest your knee.  Do not do things that cause pain or make pain worse.  Avoid activities where both feet leave the ground at the same time (high-impact activities). Examples are running, jumping rope, and doing jumping jacks.  Work with a physical therapist to make a safe exercise program, as told by your doctor. Managing pain, stiffness, and swelling  If told, put ice on the knee. To do this: ? If you have a removable knee sleeve or brace, take it off as told by your doctor. ? Put ice in a plastic bag. ? Place a towel between your skin and the bag. ? Leave the ice on for 20 minutes, 2-3 times a day. ? Take off the ice if your skin turns bright red. This is very important. If you cannot feel pain, heat, or cold, you have a greater risk of damage to the area.  If told, use an elastic bandage to put pressure (compression) on your injured knee.  Raise your knee above the level of your heart while you are sitting or lying down.  Sleep with a pillow under your knee.   General instructions  Take over-the-counter and prescription medicines only as told by your doctor.  Do not smoke or use any products that contain nicotine or tobacco. If you  need help quitting, ask your doctor.  If you are overweight, work with your doctor and a food expert (dietitian) to set goals to lose weight. Being overweight can make your knee hurt more.  Watch for any changes in your symptoms.  Keep all follow-up visits. Contact a doctor if:  The knee pain does not stop.  The knee pain changes or gets worse.  You have a fever along with knee pain.  Your knee is red or feels warm when you touch it.  Your knee gives out or locks up. Get help right away if:  Your knee swells, and the swelling gets worse.  You cannot move your knee.  You have very bad knee pain that does not get better with pain medicine. Summary  Many things can cause knee pain. The pain often goes away on its own with time and rest.  Your doctor may do tests to find out the cause of the pain.  Watch for any changes in your symptoms. Relieve your pain with rest, medicines, light activity, and use of ice.  Get help right away if you cannot move your knee or your knee pain is very bad. This information is not intended to replace advice given to you by your health care provider. Make sure you discuss   any questions you have with your health care provider. Document Revised: 07/08/2019 Document Reviewed: 07/08/2019 Elsevier Patient Education  2021 [employer] Inc.  

## 2020-02-17 MED FILL — METOPROLOL TARTRATE 100 MG: 100 | 90 days supply | Qty: 180 | Fill #2

## 2020-02-17 MED FILL — LOSARTAN POTASSIUM 25 MG TA: 25 | 90 days supply | Qty: 180 | Fill #1

## 2020-02-17 MED FILL — CITALOPRAM HBR 20 MG TABLET: 20 | 90 days supply | Qty: 180 | Fill #1

## 2020-02-17 MED FILL — ROSUVASTATIN CALCIUM 40 MG: 40 | 90 days supply | Qty: 90 | Fill #1

## 2020-03-01 ENCOUNTER — Ambulatory Visit: Payer: 59 | Admitting: Urology

## 2020-03-01 DIAGNOSIS — E1142 Type 2 diabetes mellitus with diabetic polyneuropathy: Secondary | ICD-10-CM | POA: Diagnosis not present

## 2020-03-01 DIAGNOSIS — L97521 Non-pressure chronic ulcer of other part of left foot limited to breakdown of skin: Secondary | ICD-10-CM | POA: Diagnosis not present

## 2020-03-25 DIAGNOSIS — M791 Myalgia, unspecified site: Secondary | ICD-10-CM | POA: Diagnosis not present

## 2020-03-25 DIAGNOSIS — Q87410 Marfan's syndrome with aortic dilation: Secondary | ICD-10-CM | POA: Diagnosis not present

## 2020-03-25 DIAGNOSIS — Z95828 Presence of other vascular implants and grafts: Secondary | ICD-10-CM | POA: Diagnosis not present

## 2020-04-01 ENCOUNTER — Ambulatory Visit (INDEPENDENT_AMBULATORY_CARE_PROVIDER_SITE_OTHER): Payer: 59 | Admitting: Nurse Practitioner

## 2020-04-01 ENCOUNTER — Other Ambulatory Visit: Payer: Self-pay

## 2020-04-01 ENCOUNTER — Encounter: Payer: Self-pay | Admitting: Nurse Practitioner

## 2020-04-01 ENCOUNTER — Ambulatory Visit (HOSPITAL_COMMUNITY)
Admission: RE | Admit: 2020-04-01 | Discharge: 2020-04-01 | Disposition: A | Discharge status: Home / Self Care | Payer: 59 | Source: Ambulatory Visit | Attending: Nurse Practitioner | Admitting: Nurse Practitioner

## 2020-04-01 DIAGNOSIS — R404 Transient alteration of awareness: Secondary | ICD-10-CM

## 2020-04-01 DIAGNOSIS — R519 Headache, unspecified: Secondary | ICD-10-CM

## 2020-04-01 DIAGNOSIS — J3489 Other specified disorders of nose and nasal sinuses: Secondary | ICD-10-CM | POA: Diagnosis not present

## 2020-04-01 DIAGNOSIS — J329 Chronic sinusitis, unspecified: Secondary | ICD-10-CM | POA: Diagnosis not present

## 2020-04-01 DIAGNOSIS — R410 Disorientation, unspecified: Secondary | ICD-10-CM | POA: Diagnosis not present

## 2020-04-01 IMAGING — MR MR HEAD W/O CM
14 series · 48 of 48 positions shown · non-contrast
Comparison: None.

CLINICAL DATA: Altered mental status, delirium

EXAM:
MRI HEAD WITHOUT CONTRAST
TECHNIQUE: Multiplanar, multiecho pulse sequences of the brain and surrounding
structures were obtained without intravenous contrast.

[Series 9: DWI · axial · 3.0mm · 0.77mm/px · z∈[+19,+176]mm · 5 of 54 slices shown (1 of 4)]
[im 1/54]
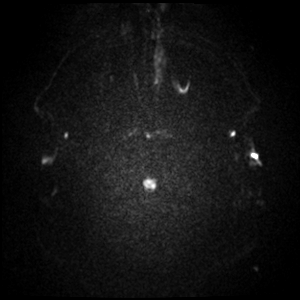
[im 14/54]
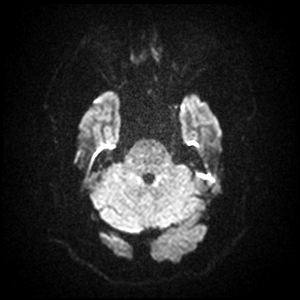
[im 27/54]
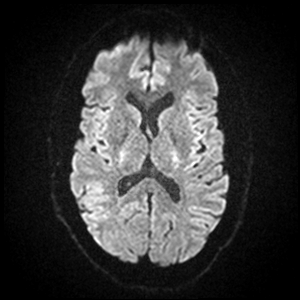
[im 40/54]
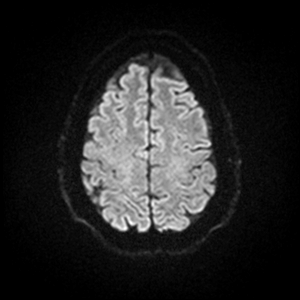
[im 54/54]
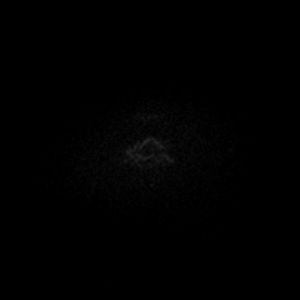

[Series 10: DWI · axial · 3.0mm · 0.77mm/px · z∈[+19,+176]mm · 4 of 54 slices shown (2 of 4)]
[im 1/54]
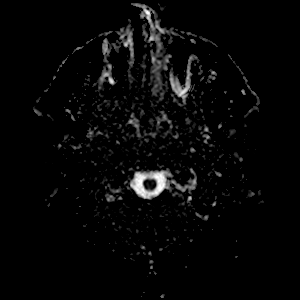
[im 18/54]
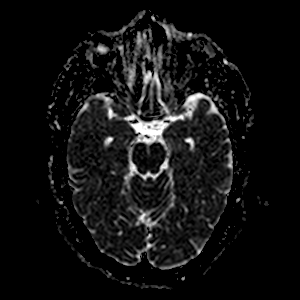
[im 36/54]
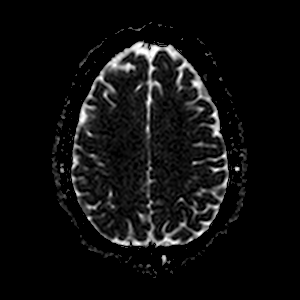
[im 54/54]
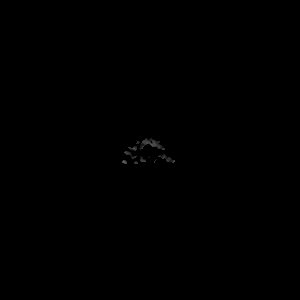

[Series 11: DWI · coronal · 5.0mm · 0.88mm/px · 2 of 31 slices shown (3 of 4)]
[im 1/31]
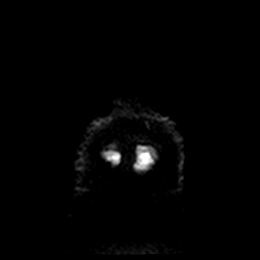
[im 31/31]
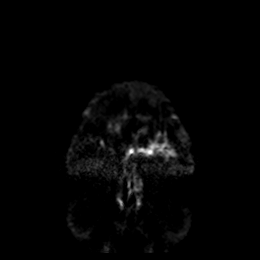

[Series 12: DWI · coronal · 5.0mm · 0.88mm/px · 2 of 31 slices shown (4 of 4)]
[im 1/31]
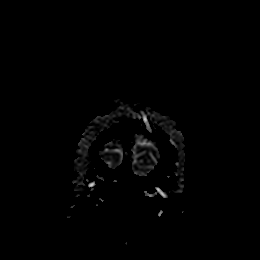
[im 31/31]
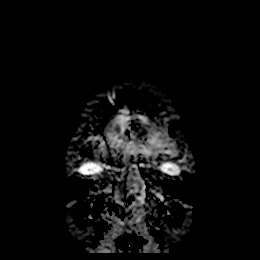

[Series 13: T1 · sagittal · 5.0mm · 0.75mm/px · 1 of 21 slices shown (1 of 2)]
[im 1/21]
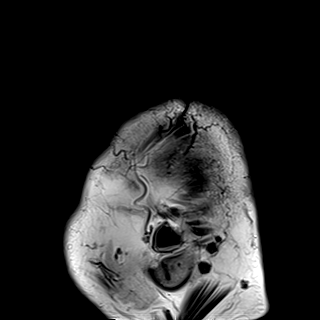

[Series 14: T2 · axial · 5.0mm · 0.72mm/px · z∈[+22,+174]mm · 2 of 23 slices shown (1 of 3)]
[im 1/23]
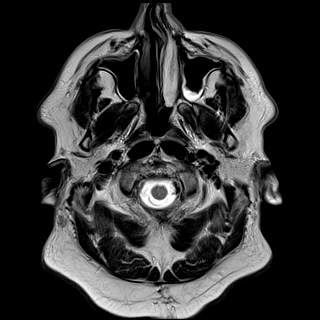
[im 23/23]
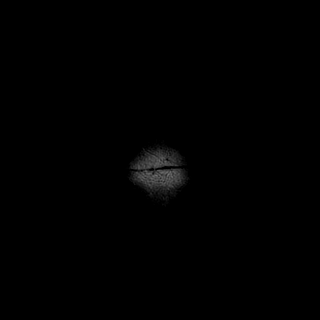

[Series 15: mag_images · axial · 3.0mm · 0.90mm/px · z∈[+11,+186]mm · 4 of 60 slices shown]
[im 1/60]
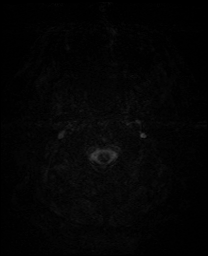
[im 20/60]
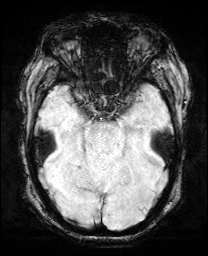
[im 40/60]
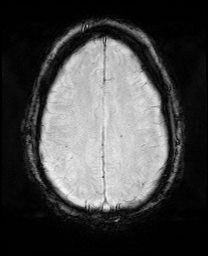
[im 60/60]
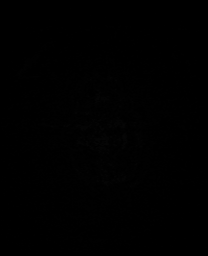

[Series 16: pha_images · axial · 3.0mm · 0.90mm/px · z∈[+11,+183]mm · 4 of 59 slices shown]
[im 1/59]
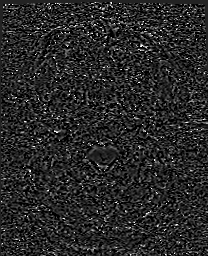
[im 20/59]
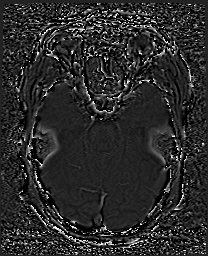
[im 39/59]
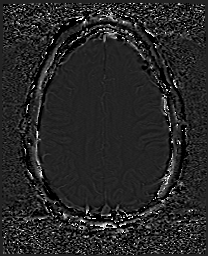
[im 59/59]
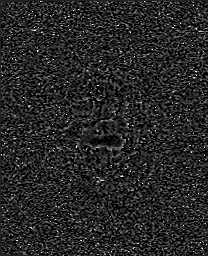

[Series 17: swi_images · axial · 3.0mm · 0.90mm/px · z∈[+11,+186]mm · 4 of 60 slices shown]
[im 1/60]
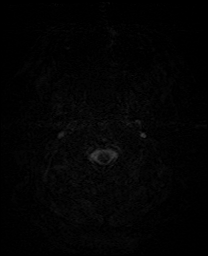
[im 20/60]
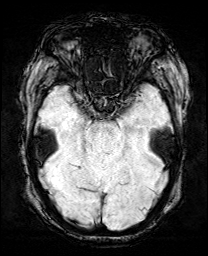
[im 40/60]
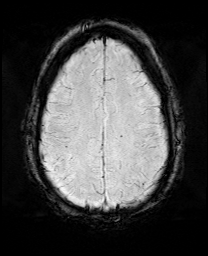
[im 60/60]
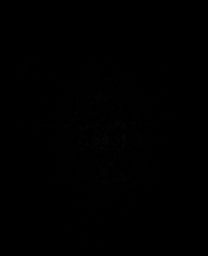

[Series 19: FLAIR · axial · 3.0mm · 0.47mm/px · z∈[+22,+176]mm · 4 of 53 slices shown (1 of 2)]
[im 1/53]
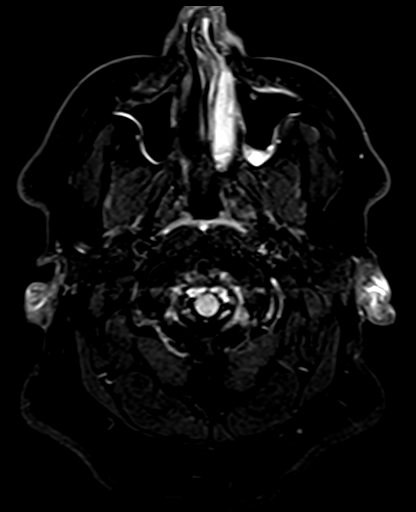
[im 18/53]
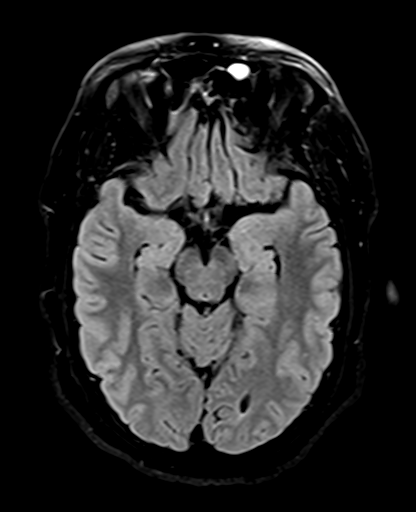
[im 35/53]
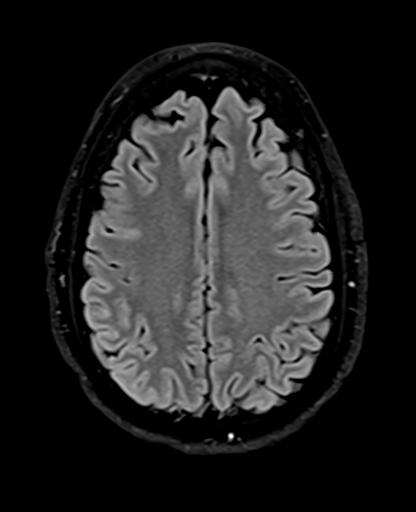
[im 53/53]
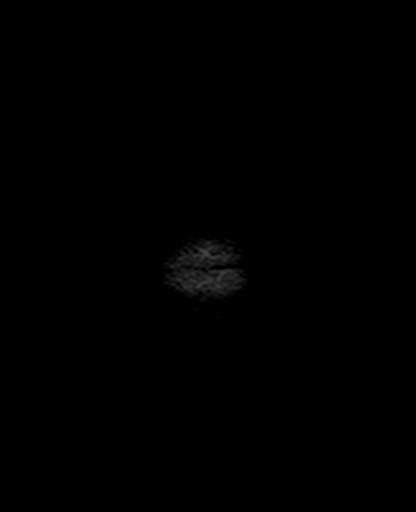

[Series 20: T1 · axial · 1.0mm · 0.98mm/px · z∈[+13,+186]mm · 12 of 172 slices shown (2 of 2)]
[im 1/172]
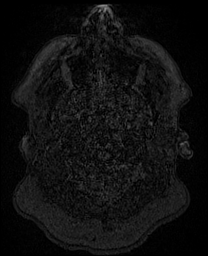
[im 16/172]
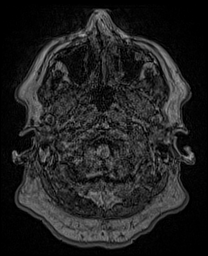
[im 32/172]
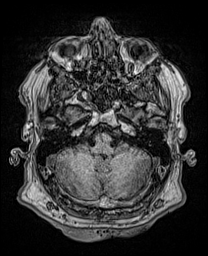
[im 47/172]
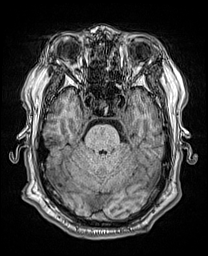
[im 63/172]
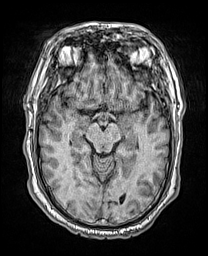
[im 78/172]
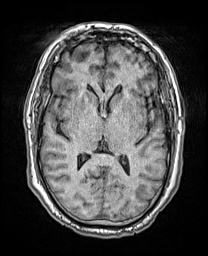
[im 94/172]
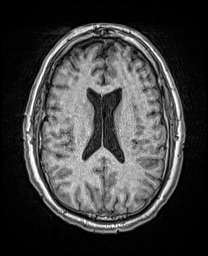
[im 109/172]
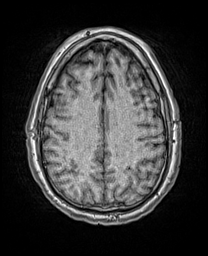
[im 125/172]
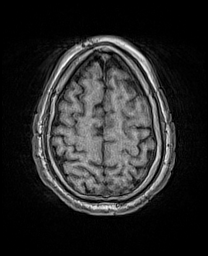
[im 140/172]
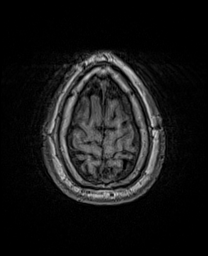
[im 156/172]
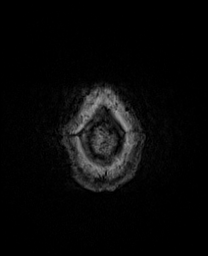
[im 172/172]
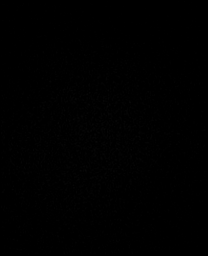

[Series 21: T2 · coronal · 5.0mm · 0.72mm/px · 2 of 30 slices shown (2 of 3)]
[im 1/30]
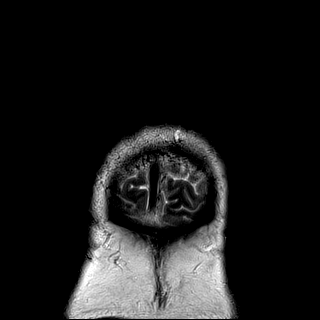
[im 30/30]
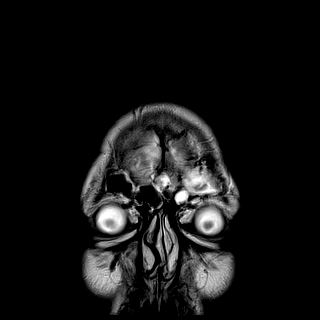

[Series 22: FLAIR · coronal · 3.0mm · 0.56mm/px · 1 of 21 slices shown (2 of 2)]
[im 1/21]
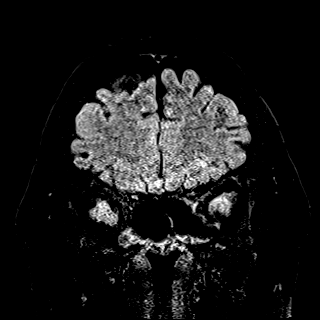

[Series 23: T2 · coronal · 3.0mm · 0.27mm/px · 1 of 21 slices shown (3 of 3)]
[im 1/21]
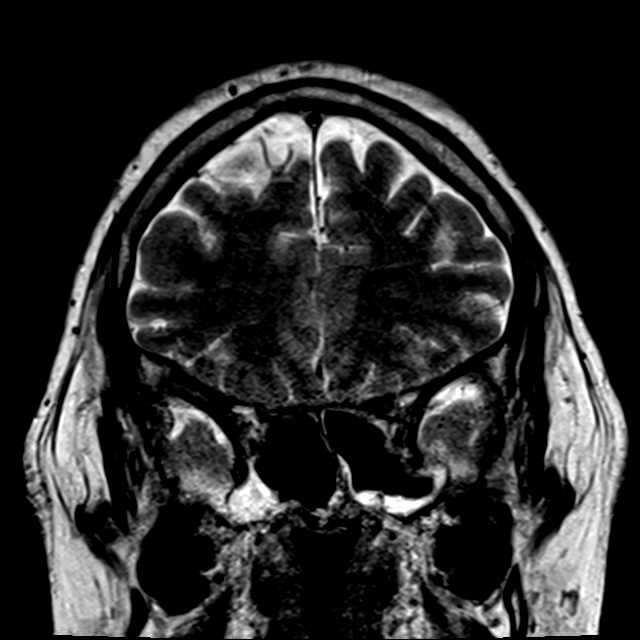

[48 of 48 positions shown; findings below may reference images not displayed]

FINDINGS: Brain: There is no acute infarction or intracranial hemorrhage.
There is no intracranial mass, mass effect, or edema. There is no
hydrocephalus or extra-axial fluid collection. Ventricles and sulci
are normal in size and configuration. Few scattered small foci of T2
hyperintensity in the supratentorial white matter likely reflect
nonspecific gliosis/demyelination of doubtful clinical significance.

Vascular: Major vessel flow voids at the skull base are preserved.

Skull and upper cervical spine: Normal marrow signal is preserved.

Sinuses/Orbits: Moderate mucosal thickening. Orbits are
unremarkable.

Other: Sella is unremarkable.  Mastoid air cells are clear.
IMPRESSION: No acute infarction, hemorrhage mass.

Nonspecific paranasal sinus inflammatory changes.

## 2020-04-01 NOTE — Progress Notes (Signed)
Virtual Visit via telephone Note Due to COVID-19 pandemic this visit was conducted virtually. This visit type was conducted due to national recommendations for restrictions regarding the COVID-19 Pandemic (e.g. social distancing, sheltering in place) in an effort to limit this patient's exposure and mitigate transmission in our community. All issues noted in this document were discussed and addressed.  A physical exam was not performed with this format.  I connected with Carl Bruce on 04/01/20 at 7:40 by telephone and verified that I am speaking with the correct person using two identifiers. Carl Bruce is currently located at home and no one is currently with him during visit. The provider, Mary-Margaret Daphine Deutscher, FNP is located in their office at time of visit.  I discussed the limitations, risks, security and privacy concerns of performing an evaluation and management service by telephone and the availability of in person appointments. I also discussed with the patient that there may be a patient responsible charge related to this service. The patient expressed understanding and agreed to proceed.   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 His uncle walked outside ar work yesterday and patient was leaning over the dumpster shaking. His uncle thought he was laughing. When he approached him the patient told him something was wrong with his heart. The uncle called patients wife and told her he thought he was having a stroke. His wife took about 10 minutes to get there. By the time she got there ,he was seating down on ground, He was slightly confused. Could  Not remember the last few hours. EMS was called. Vitals were stable. Patient refused to go with EMS. His wife decided to take him to the hospital. When they got to the hospital they would not let his wife in with him so he refused to go in and be seen. She took him  home. He fell asleep and slept 6 hours. She woke him up and did neuro assessment on him. He had neuro deficits , other then he could not remember the event that occurred at work. He could remember what he had for lunch , but did  Not remember walking out to the dumpster, leaning over it or shaking. This morning he is c/o headache and weakness. Feels hot but has no fever. Is sweaty.    Review of Systems  Constitutional: Positive for diaphoresis and malaise/fatigue. Negative for chills and fever.  HENT: Negative.   Respiratory: Negative.   Cardiovascular: Negative.   Musculoskeletal: Negative for myalgias.  Neurological: Positive for headaches.  Psychiatric/Behavioral: Negative.      Observations/Objective: Alert and oriented- answers all questions appropriately Slow to answer questions No distress Voice shaky    Assessment and Plan: Carl Bruce in today with chief complaint of Seizures   1. Transient alteration of awareness  2. Acute nonintractable headache, unspecified headache type Wife will stay with im and monitor for any neurological changes  Orders Placed This Encounter  Procedures  . MR Brain Wo Contrast    Standing Status:   Future    Standing Expiration Date:   04/01/2021    Order Specific Question:   What is the patient's sedation requirement?    Answer:   No Sedation    Order Specific Question:   Does the patient have a pacemaker or implanted devices?    Answer:   No    Order Specific Question:   Preferred  imaging location?    Answer:   [facility] (table limit [id number])     Follow Up Instructions: prn    I discussed the assessment and treatment plan with the patient. The patient was provided an opportunity to ask questions and all were answered. The patient agreed with the plan and demonstrated an understanding of the instructions.   The patient was advised to call back or seek an in-person evaluation if the symptoms worsen or if the  condition fails to improve as anticipated.  The above assessment and management plan was discussed with the patient. The patient verbalized understanding of and has agreed to the management plan. Patient is aware to call the clinic if symptoms persist or worsen. Patient is aware when to return to the clinic for a follow-up visit. Patient educated on when it is appropriate to go to the emergency department.   Time call ended:  8:07  I provided 27 minutes of non-face-to-face time during this encounter.    Mary-Margaret Daphine Deutscher, FNP

## 2020-04-01 NOTE — Addendum Note (Signed)
Addended by: Bennie Pierini on: 04/01/2020 12:11 PM   Modules accepted: Orders

## 2020-04-25 ENCOUNTER — Ambulatory Visit (INDEPENDENT_AMBULATORY_CARE_PROVIDER_SITE_OTHER): Payer: 59 | Admitting: Nurse Practitioner

## 2020-04-25 ENCOUNTER — Encounter: Payer: Self-pay | Admitting: Nurse Practitioner

## 2020-04-25 DIAGNOSIS — M5441 Lumbago with sciatica, right side: Secondary | ICD-10-CM

## 2020-04-25 MED ORDER — PREDNISONE 10 MG (21) PO TBPK
ORAL_TABLET | ORAL | 0 refills | Status: DC
Start: 2020-04-25 — End: 2020-08-12

## 2020-04-25 NOTE — Progress Notes (Signed)
   Virtual Visit via telephone Note Due to COVID-19 pandemic this visit was conducted virtually. This visit type was conducted due to national recommendations for restrictions regarding the COVID-19 Pandemic (e.g. social distancing, sheltering in place) in an effort to limit this patient's exposure and mitigate transmission in our community. All issues noted in this document were discussed and addressed.  A physical exam was not performed with this format.  I connected with Carl Bruce on 04/25/20 at 3:37 by telephone and verified that I am speaking with the correct person using two identifiers. Carl Bruce is currently located at work and no one is currently with him during visit. The provider, Mary-Margaret Daphine Deutscher, FNP is located in their office at time of visit.  I discussed the limitations, risks, security and privacy concerns of performing an evaluation and management service by telephone and the availability of in person appointments. I also discussed with the patient that there may be a patient responsible charge related to this service. The patient expressed understanding and agreed to proceed.   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 Rates pain 7/10 and does radiate some down his right leg. He has been using heat which has helped some. Rest make sit better. Standing for lon periods of time make sit worse.  Review of Systems  Musculoskeletal: Positive for back pain.     Observations/Objective: Alert and oriented- answers all questions appropriately No distress  Pain on flexion and extension Neg SLR  Assessment and Plan: Carl Bruce in today with chief complaint of Back Pain   1. Acute right-sided low back pain with right-sided sciatica Moist heat Rest No bendng or stooping Meds ordered this encounter  Medications  . predniSONE (STERAPRED UNI-PAK 21  TAB) 10 MG (21) TBPK tablet    Sig: As directed x 6 days    Dispense:  21 tablet    Refill:  0    Order Specific Question:   Supervising Provider    Answer:   Arville Care A [[id number]]       Follow Up Instructions: prn    I discussed the assessment and treatment plan with the patient. The patient was provided an opportunity to ask questions and all were answered. The patient agreed with the plan and demonstrated an understanding of the instructions.   The patient was advised to call back or seek an in-person evaluation if the symptoms worsen or if the condition fails to improve as anticipated.  The above assessment and management plan was discussed with the patient. The patient verbalized understanding of and has agreed to the management plan. Patient is aware to call the clinic if symptoms persist or worsen. Patient is aware when to return to the clinic for a follow-up visit. Patient educated on when it is appropriate to go to the emergency department.   Time call ended: 3:48   I provided 12 minutes of non-face-to-face time during this encounter.    Mary-Margaret Daphine Deutscher, FNP

## 2020-04-27 ENCOUNTER — Other Ambulatory Visit (HOSPITAL_BASED_OUTPATIENT_CLINIC_OR_DEPARTMENT_OTHER): Payer: Self-pay

## 2020-05-09 DIAGNOSIS — G454 Transient global amnesia: Secondary | ICD-10-CM | POA: Diagnosis not present

## 2020-05-09 DIAGNOSIS — R419 Unspecified symptoms and signs involving cognitive functions and awareness: Secondary | ICD-10-CM | POA: Diagnosis not present

## 2020-05-10 DIAGNOSIS — L97521 Non-pressure chronic ulcer of other part of left foot limited to breakdown of skin: Secondary | ICD-10-CM | POA: Diagnosis not present

## 2020-05-12 ENCOUNTER — Other Ambulatory Visit (HOSPITAL_COMMUNITY): Payer: Self-pay

## 2020-06-12 MED FILL — Citalopram Hydrobromide Tab 20 MG (Base Equiv): ORAL | 90 days supply | Qty: 180 | Fill #0 | Status: AC

## 2020-06-12 MED FILL — Metoprolol Tartrate Tab 100 MG: ORAL | 90 days supply | Qty: 180 | Fill #0 | Status: AC

## 2020-06-12 MED FILL — Losartan Potassium Tab 25 MG: ORAL | 90 days supply | Qty: 180 | Fill #0 | Status: AC

## 2020-06-12 MED FILL — Rosuvastatin Calcium Tab 40 MG: ORAL | 90 days supply | Qty: 90 | Fill #0 | Status: AC

## 2020-06-13 ENCOUNTER — Other Ambulatory Visit (HOSPITAL_COMMUNITY): Payer: Self-pay

## 2020-06-14 ENCOUNTER — Other Ambulatory Visit (HOSPITAL_COMMUNITY): Payer: Self-pay

## 2020-07-02 DIAGNOSIS — R55 Syncope and collapse: Secondary | ICD-10-CM | POA: Diagnosis not present

## 2020-07-02 DIAGNOSIS — I10 Essential (primary) hypertension: Secondary | ICD-10-CM | POA: Diagnosis not present

## 2020-07-02 DIAGNOSIS — R32 Unspecified urinary incontinence: Secondary | ICD-10-CM | POA: Diagnosis not present

## 2020-07-02 DIAGNOSIS — M25511 Pain in right shoulder: Secondary | ICD-10-CM | POA: Diagnosis not present

## 2020-07-02 DIAGNOSIS — R519 Headache, unspecified: Secondary | ICD-10-CM | POA: Diagnosis not present

## 2020-07-02 DIAGNOSIS — F1220 Cannabis dependence, uncomplicated: Secondary | ICD-10-CM | POA: Diagnosis not present

## 2020-07-02 DIAGNOSIS — E785 Hyperlipidemia, unspecified: Secondary | ICD-10-CM | POA: Diagnosis not present

## 2020-07-02 DIAGNOSIS — R569 Unspecified convulsions: Secondary | ICD-10-CM | POA: Diagnosis not present

## 2020-07-02 DIAGNOSIS — R4182 Altered mental status, unspecified: Secondary | ICD-10-CM | POA: Diagnosis not present

## 2020-07-02 DIAGNOSIS — Z87891 Personal history of nicotine dependence: Secondary | ICD-10-CM | POA: Diagnosis not present

## 2020-07-02 DIAGNOSIS — W1830XA Fall on same level, unspecified, initial encounter: Secondary | ICD-10-CM | POA: Diagnosis not present

## 2020-07-02 DIAGNOSIS — R079 Chest pain, unspecified: Secondary | ICD-10-CM | POA: Diagnosis not present

## 2020-07-03 DIAGNOSIS — I4510 Unspecified right bundle-branch block: Secondary | ICD-10-CM | POA: Diagnosis not present

## 2020-07-03 DIAGNOSIS — I440 Atrioventricular block, first degree: Secondary | ICD-10-CM | POA: Diagnosis not present

## 2020-07-06 ENCOUNTER — Other Ambulatory Visit: Payer: Self-pay | Admitting: Nurse Practitioner

## 2020-07-06 MED ORDER — NYSTATIN 100000 UNIT/ML MT SUSP: 5.0000 mL | Freq: Four times a day (QID) | OROMUCOSAL | 0 refills | Status: DC

## 2020-08-02 DIAGNOSIS — W1830XA Fall on same level, unspecified, initial encounter: Secondary | ICD-10-CM | POA: Diagnosis not present

## 2020-08-02 DIAGNOSIS — R569 Unspecified convulsions: Secondary | ICD-10-CM | POA: Diagnosis not present

## 2020-08-02 DIAGNOSIS — M25511 Pain in right shoulder: Secondary | ICD-10-CM | POA: Diagnosis not present

## 2020-08-02 DIAGNOSIS — R4182 Altered mental status, unspecified: Secondary | ICD-10-CM | POA: Diagnosis not present

## 2020-08-12 ENCOUNTER — Other Ambulatory Visit: Payer: Self-pay

## 2020-08-12 ENCOUNTER — Ambulatory Visit: Payer: 59 | Admitting: Nurse Practitioner

## 2020-08-12 ENCOUNTER — Encounter: Payer: Self-pay | Admitting: Nurse Practitioner

## 2020-08-12 ENCOUNTER — Other Ambulatory Visit (HOSPITAL_COMMUNITY): Payer: Self-pay

## 2020-08-12 VITALS — BP 142/82 | HR 87 | Temp 97.7°F | Resp 20 | Ht >= 80 in | Wt 299.0 lb

## 2020-08-12 DIAGNOSIS — E782 Mixed hyperlipidemia: Secondary | ICD-10-CM

## 2020-08-12 DIAGNOSIS — F411 Generalized anxiety disorder: Secondary | ICD-10-CM

## 2020-08-12 DIAGNOSIS — Q87410 Marfan's syndrome with aortic dilation: Secondary | ICD-10-CM | POA: Diagnosis not present

## 2020-08-12 DIAGNOSIS — R0683 Snoring: Secondary | ICD-10-CM | POA: Diagnosis not present

## 2020-08-12 DIAGNOSIS — I10 Essential (primary) hypertension: Secondary | ICD-10-CM | POA: Diagnosis not present

## 2020-08-12 MED ORDER — METOPROLOL TARTRATE 100 MG PO TABS
ORAL_TABLET | Freq: Two times a day (BID) | ORAL | 3 refills | Status: DC
  Filled 2020-08-12: qty 180, fill #0
  Filled 2020-12-18: qty 180, 90d supply, fill #0
  Filled 2021-03-27: qty 180, 90d supply, fill #1

## 2020-08-12 MED ORDER — LOSARTAN POTASSIUM 25 MG PO TABS
ORAL_TABLET | Freq: Two times a day (BID) | ORAL | 3 refills | Status: DC
  Filled 2020-08-12: qty 180, fill #0
  Filled 2020-12-18: qty 180, 90d supply, fill #0

## 2020-08-12 MED ORDER — ROSUVASTATIN CALCIUM 20 MG PO TABS
20.0000 mg | ORAL_TABLET | Freq: Every day | ORAL | 1 refills | Status: DC
  Filled 2020-08-12: qty 90, 90d supply, fill #0
  Filled 2020-12-19: qty 90, 90d supply, fill #1

## 2020-08-12 MED ORDER — CITALOPRAM HYDROBROMIDE 20 MG PO TABS
ORAL_TABLET | Freq: Two times a day (BID) | ORAL | 3 refills | Status: DC
  Filled 2020-08-12: qty 180, fill #0
  Filled 2020-09-19: qty 180, 90d supply, fill #0
  Filled 2020-12-18: qty 180, 90d supply, fill #1
  Filled 2021-03-27: qty 180, 90d supply, fill #2
  Filled 2021-06-26: qty 180, 90d supply, fill #3
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
## 2020-08-12 NOTE — Patient Instructions (Signed)
Sleep Apnea Sleep apnea is a condition in which breathing pauses or becomes shallow during sleep. People with sleep apnea usually snore loudly. They may have times when they gasp and stop breathing for 10 seconds or more during sleep. This mayhappen many times during the night. Sleep apnea disrupts your sleep and keeps your body from getting the rest that it needs. This condition can increase your risk of certain health problems, including: Heart attack. Stroke. Obesity. Type 2 diabetes. Heart failure. Irregular heartbeat. High blood pressure. The goal of treatment is to help you breathe normally again. What are the causes? The most common cause of sleep apnea is a collapsed or blocked airway. There are three kinds of sleep apnea: Obstructive sleep apnea. This kind is caused by a blocked or collapsed airway. Central sleep apnea. This kind happens when the part of the brain that controls breathing does not send the correct signals to the muscles that control breathing. Mixed sleep apnea. This is a combination of obstructive and central sleep apnea. What increases the risk? You are more likely to develop this condition if you: Are overweight. Smoke. Have a smaller than normal airway. Are older. Are male. Drink alcohol. Take sedatives or tranquilizers. Have a family history of sleep apnea. Have a tongue or tonsils that are larger than normal. What are the signs or symptoms? Symptoms of this condition include: Trouble staying asleep. Loud snoring. Morning headaches. Waking up gasping. Dry mouth or sore throat in the morning. Daytime sleepiness and tiredness. If you have daytime fatigue because of sleep apnea, you may be more likely to have: Trouble concentrating. Forgetfulness. Irritability or mood swings. Personality changes. Feelings of depression. Sexual dysfunction. This may include loss of interest if you are male, or erectile dysfunction if you are male. How is this  diagnosed? This condition may be diagnosed with: A medical history. A physical exam. A series of tests that are done while you are sleeping (sleep study). These tests are usually done in a sleep lab, but they may also be done at home. How is this treated? Treatment for this condition aims to restore normal breathing and to ease symptoms during sleep. It may involve managing health issues that can affect breathing, such as high blood pressure or obesity. Treatment may include: Sleeping on your side. Using a decongestant if you have nasal congestion. Avoiding the use of depressants, including alcohol, sedatives, and narcotics. Losing weight if you are overweight. Making changes to your diet. Quitting smoking. Using a device to open your airway while you sleep, such as: An oral appliance. This is a custom-made mouthpiece that shifts your lower jaw forward. A continuous positive airway pressure (CPAP) device. This device blows air through a mask when you breathe out (exhale). A nasal expiratory positive airway pressure (EPAP) device. This device has valves that you put into each nostril. A bi-level positive airway pressure (BPAP) device. This device blows air through a mask when you breathe in (inhale) and breathe out (exhale). Having surgery if other treatments do not work. During surgery, excess tissue is removed to create a wider airway. Follow these instructions at home: Lifestyle Make any lifestyle changes that your health care provider recommends. Eat a healthy, well-balanced diet. Take steps to lose weight if you are overweight. Avoid using depressants, including alcohol, sedatives, and narcotics. Do not use any products that contain nicotine or tobacco. These products include cigarettes, chewing tobacco, and vaping devices, such as e-cigarettes. If you need help quitting, ask your health   care provider. General instructions Take over-the-counter and prescription medicines only as told  by your health care provider. If you were given a device to open your airway while you sleep, use it only as told by your health care provider. If you are having surgery, make sure to tell your health care provider you have sleep apnea. You may need to bring your device with you. Keep all follow-up visits. This is important. Contact a health care provider if: The device that you received to open your airway during sleep is uncomfortable or does not seem to be working. Your symptoms do not improve. Your symptoms get worse. Get help right away if: You develop: Chest pain. Shortness of breath. Discomfort in your back, arms, or stomach. You have: Trouble speaking. Weakness on one side of your body. Drooping in your face. These symptoms may represent a serious problem that is an emergency. Do not wait to see if the symptoms will go away. Get medical help right away. Call your local emergency services (911 in the [place] Do not drive yourself to the hospital. Summary Sleep apnea is a condition in which breathing pauses or becomes shallow during sleep. The most common cause is a collapsed or blocked airway. The goal of treatment is to restore normal breathing and to ease symptoms during sleep. This information is not intended to replace advice given to you by your health care provider. Make sure you discuss any questions you have with your healthcare provider. Document Revised: 01/01/2020 Document Reviewed: 01/01/2020 Elsevier Patient Education  2022 [employer].  

## 2020-08-17 ENCOUNTER — Ambulatory Visit (INDEPENDENT_AMBULATORY_CARE_PROVIDER_SITE_OTHER): Payer: 59 | Admitting: Family Medicine

## 2020-08-17 ENCOUNTER — Encounter: Payer: Self-pay | Admitting: Family Medicine

## 2020-08-17 VITALS — BP 132/77 | HR 64 | Temp 96.4°F | Resp 20 | Ht >= 80 in

## 2020-08-17 DIAGNOSIS — S81811A Laceration without foreign body, right lower leg, initial encounter: Secondary | ICD-10-CM

## 2020-08-17 DIAGNOSIS — Z23 Encounter for immunization: Secondary | ICD-10-CM | POA: Diagnosis not present

## 2020-08-17 MED ORDER — CEPHALEXIN 500 MG PO CAPS
500.0000 mg | ORAL_CAPSULE | Freq: Four times a day (QID) | ORAL | 0 refills | Status: DC
Start: 2020-08-17 — End: 2020-12-08

## 2020-08-17 NOTE — Progress Notes (Signed)
BP 132/77   Pulse 64   Temp (!) 96.4 F (35.8 C)   Resp 20   Ht 7' (2.134 m)   BMI 29.79 kg/m    Subjective:   Patient ID: Carl Bruce, male    DOB: 12-Nov-1975, 45 y.o.   MRN: [id number]  HPI: Carl Bruce is a 45 y.o. male presenting on 08/17/2020 for Laceration (RLE/dropped motor on leg)   HPI Right leg laceration Patient was get into his truck and slipped and caught the front of his right shin on a part of the truck.  This happened earlier this morning and he sustained a laceration and is coming in today to get it fixed or repaired.  He denies any numbness or weakness in his toes or feet and is just 2 vertical his incisions right neck to 10 on that right leg.  He has not been bleeding that much and has been able to mostly stop the bleeding with pressure.  Relevant past medical, surgical, family and social history reviewed and updated as indicated. Interim medical history since our last visit reviewed. Allergies and medications reviewed and updated.  Review of Systems  Constitutional:  Negative for chills and fever.  Respiratory:  Negative for shortness of breath and wheezing.   Cardiovascular:  Negative for chest pain and leg swelling.  Skin:  Positive for wound. Negative for color change and rash.  All other systems reviewed and are negative.  Per HPI unless specifically indicated above   Allergies as of 08/17/2020       Reactions   Penicillins Other (See Comments)   Unknown reaction as child   Bactrim [sulfamethoxazole-trimethoprim] Rash        Medication List        Accurate as of August 17, 2020 11:56 AM. If you have any questions, ask your nurse or doctor.          B-12 PO Take by mouth.   cephALEXin 500 MG capsule Commonly known as: KEFLEX Take 1 capsule (500 mg total) by mouth 4 (four) times daily.   citalopram 20 MG tablet Commonly known as: CELEXA TAKE 1 TABLET BY MOUTH TWO TIMES DAILY   losartan 25 MG tablet Commonly known as:  COZAAR TAKE 1 TABLET BY MOUTH TWO TIMES DAILY   metoprolol tartrate 100 MG tablet Commonly known as: LOPRESSOR TAKE 1 TABLET BY MOUTH TWO TIMES DAILY   rosuvastatin 20 MG tablet Commonly known as: CRESTOR Take 1 tablet (20 mg total) by mouth daily.         Objective:   BP 132/77   Pulse 64   Temp (!) 96.4 F (35.8 C)   Resp 20   Ht 7' (2.134 m)   BMI 29.79 kg/m   Wt Readings from Last 3 Encounters:  08/12/20 299 lb (135.6 kg)  02/15/20 (!) 320 lb (145.2 kg)  01/05/20 (!) 320 lb (145.2 kg)    Physical Exam Vitals and nursing note reviewed.  Constitutional:      Appearance: Normal appearance.  Skin:      Neurological:     Mental Status: He is alert.     Laceration repair: Wound was irrigated with normal saline at pressure. 2% lidocaine with epinephrine was used for local anesthesia, 36mL. 3-0 Monocryl was used to repair the wound.  Placed 7sutures, 5 on the right wound and 2 on the left. Wound was approximated well and topical antibiotic was used and then it was covered by 4 x 4 and  tape told in place. Procedure was tolerated well  Assessment & Plan:   Problem List Items Addressed This Visit   None Visit Diagnoses     Leg laceration, right, initial encounter    -  Primary   Need for Tdap vaccination       Relevant Medications   cephALEXin (KEFLEX) 500 MG capsule   Other Relevant Orders   Tdap vaccine greater than or equal to 7yo IM (Completed)       Able to repair an approximate with minimal bleeding.  Recommended removal in 7 to 10 days. Follow up plan: Return if symptoms worsen or fail to improve, for 7 to 10 days suture removal.  Counseling provided for all of the vaccine components Orders Placed This Encounter  Procedures   Tdap vaccine greater than or equal to 7yo IM    Arville Care, MD [facility] 08/17/2020, 11:56 AM

## 2020-08-18 DIAGNOSIS — R4020 Unspecified coma: Secondary | ICD-10-CM | POA: Diagnosis not present

## 2020-09-14 DIAGNOSIS — G4710 Hypersomnia, unspecified: Secondary | ICD-10-CM | POA: Diagnosis not present

## 2020-09-15 DIAGNOSIS — G4710 Hypersomnia, unspecified: Secondary | ICD-10-CM | POA: Diagnosis not present

## 2020-09-20 ENCOUNTER — Other Ambulatory Visit (HOSPITAL_COMMUNITY): Payer: Self-pay

## 2020-09-20 ENCOUNTER — Other Ambulatory Visit: Payer: 59

## 2020-09-20 DIAGNOSIS — M545 Low back pain, unspecified: Secondary | ICD-10-CM

## 2020-09-20 LAB — URINALYSIS, COMPLETE
Bilirubin, UA: NEGATIVE
Glucose, UA: NEGATIVE
Ketones, UA: NEGATIVE
Leukocytes,UA: NEGATIVE
Nitrite, UA: NEGATIVE
Protein,UA: NEGATIVE
Specific Gravity, UA: 1.015 (ref 1.005–1.030)
Urobilinogen, Ur: 0.2 mg/dL (ref 0.2–1.0)
pH, UA: 5.5 (ref 5.0–7.5)

## 2020-09-20 LAB — MICROSCOPIC EXAMINATION
Bacteria, UA: NONE SEEN
RBC, Urine: NONE SEEN /hpf (ref 0–2)
Renal Epithel, UA: NONE SEEN /hpf
WBC, UA: NONE SEEN /hpf (ref 0–5)

## 2020-09-21 DIAGNOSIS — G4733 Obstructive sleep apnea (adult) (pediatric): Secondary | ICD-10-CM | POA: Diagnosis not present

## 2020-09-22 LAB — URINE CULTURE

## 2020-09-26 ENCOUNTER — Other Ambulatory Visit: Payer: Self-pay | Admitting: Nurse Practitioner

## 2020-09-26 DIAGNOSIS — M2041 Other hammer toe(s) (acquired), right foot: Secondary | ICD-10-CM

## 2020-10-03 ENCOUNTER — Ambulatory Visit: Payer: 59

## 2020-10-03 ENCOUNTER — Encounter: Payer: Self-pay | Admitting: Podiatry

## 2020-10-03 ENCOUNTER — Ambulatory Visit (INDEPENDENT_AMBULATORY_CARE_PROVIDER_SITE_OTHER): Payer: 59

## 2020-10-03 ENCOUNTER — Ambulatory Visit: Payer: 59 | Admitting: Podiatry

## 2020-10-03 ENCOUNTER — Other Ambulatory Visit: Payer: Self-pay

## 2020-10-03 DIAGNOSIS — M216X1 Other acquired deformities of right foot: Secondary | ICD-10-CM

## 2020-10-03 DIAGNOSIS — M2042 Other hammer toe(s) (acquired), left foot: Secondary | ICD-10-CM

## 2020-10-03 DIAGNOSIS — M2041 Other hammer toe(s) (acquired), right foot: Secondary | ICD-10-CM

## 2020-10-03 DIAGNOSIS — M216X2 Other acquired deformities of left foot: Secondary | ICD-10-CM

## 2020-10-03 DIAGNOSIS — M21861 Other specified acquired deformities of right lower leg: Secondary | ICD-10-CM

## 2020-10-03 DIAGNOSIS — M21862 Other specified acquired deformities of left lower leg: Secondary | ICD-10-CM

## 2020-10-03 MED ORDER — DOXYCYCLINE HYCLATE 100 MG PO TABS: 100.0000 mg | ORAL_TABLET | Freq: Two times a day (BID) | ORAL | 0 refills | Status: AC

## 2020-10-03 MED ORDER — MUPIROCIN 2 % EX OINT: 1.0000 "application " | TOPICAL_OINTMENT | Freq: Two times a day (BID) | CUTANEOUS | 2 refills | Status: DC

## 2020-10-05 NOTE — Progress Notes (Signed)
  Subjective:  Patient ID: Carl Bruce, male    DOB: 1975/08/04,  MRN: [id number]  Chief Complaint  Patient presents with   Hammer Toe     (xray)(np) - hammer toe right foot    45 y.o. male presents with the above complaint. History confirmed with patient.  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  His toes are very contracted and have difficulty rubbing in his work boots he works as an Architectural technologist.  He continuously develops ulcerations on the tips of the toes left is always the worst  Objective:  Physical Exam: warm, good capillary refill, no trophic changes or ulcerative lesions, normal DP and PT pulses, and normal sensory exam.  Severe rigid hammertoe deformities bilaterally 2 through 5 worse on the left side and there is a full-thickness ulceration at the tip of the second toe and preulcerative callus plantar third MTPJ.  Granular wound base no signs of infection.  Approximately 1 cm diameter does not probe to bone.  He has equinus. Radiographs: Multiple views x-ray of both feet: Rigid hammertoe deformities Assessment:   1. Hammertoe of left foot   2. Hammertoe of right foot   3. Gastrocnemius equinus of left lower extremity   4. Gastrocnemius equinus of right lower extremity      Plan:  Patient was evaluated and treated and all questions answered.  Reviewed the radiograph and clinical findings with patient and his wife.  I recommended local wound care with mupirocin antibiotic ointment daily changes and doxycycline to see if we get the wound to heal.  I do think there is a role for surgery to correct his deformity otherwise he will continue to have recurrent ulcerations.  Surgically he would need hammertoe corrections with arthrodesis capsulotomies and likely metatarsal osteotomies.  Possible gastrocnemius recession as well.  He will return in 3 weeks for a wound check and for presurgical planning  visit.  We will pick a definitive date and review informed consent at that time  Return in about 3 weeks (around 10/24/2020) for wound check, surgery planning for hammertoes and calluses .

## 2020-10-26 DIAGNOSIS — G4733 Obstructive sleep apnea (adult) (pediatric): Secondary | ICD-10-CM | POA: Diagnosis not present

## 2020-10-31 ENCOUNTER — Encounter: Payer: Self-pay | Admitting: Podiatry

## 2020-10-31 ENCOUNTER — Other Ambulatory Visit: Payer: Self-pay

## 2020-10-31 ENCOUNTER — Ambulatory Visit: Payer: 59 | Admitting: Podiatry

## 2020-10-31 DIAGNOSIS — M2042 Other hammer toe(s) (acquired), left foot: Secondary | ICD-10-CM

## 2020-10-31 DIAGNOSIS — L97522 Non-pressure chronic ulcer of other part of left foot with fat layer exposed: Secondary | ICD-10-CM

## 2020-10-31 DIAGNOSIS — M216X1 Other acquired deformities of right foot: Secondary | ICD-10-CM | POA: Diagnosis not present

## 2020-10-31 DIAGNOSIS — M21862 Other specified acquired deformities of left lower leg: Secondary | ICD-10-CM

## 2020-10-31 DIAGNOSIS — M216X2 Other acquired deformities of left foot: Secondary | ICD-10-CM

## 2020-10-31 DIAGNOSIS — M2041 Other hammer toe(s) (acquired), right foot: Secondary | ICD-10-CM

## 2020-10-31 DIAGNOSIS — M21861 Other specified acquired deformities of right lower leg: Secondary | ICD-10-CM

## 2020-11-04 NOTE — Progress Notes (Signed)
  Subjective:  Patient ID: Carl Bruce, male    DOB: 1975-09-14,  MRN: [id number]  Chief Complaint  Patient presents with   Hammer Toe    wound check, surgery planning for hammertoes and calluses     45 y.o. male presents with the above complaint. History confirmed with patient.  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  His toes are very contracted and have difficulty rubbing in his work boots he works as an Architectural technologist.  He continuously develops ulcerations on the tips of the toes left is always the worst  Ulceration still present  Objective:  Physical Exam: warm, good capillary refill, no trophic changes or ulcerative lesions, normal DP and PT pulses, and normal sensory exam.  Severe rigid hammertoe deformities bilaterally 2 through 5 worse on the left side and there is a full-thickness ulceration at the tip of the second toe and preulcerative callus plantar third MTPJ.  Granular wound base no signs of infection.  Approximately 1 cm diameter does not probe to bone.  He has equinus. Radiographs: Multiple views x-ray of both feet: Rigid hammertoe deformities Assessment:   1. Hammertoe of right foot   2. Hammertoe of left foot   3. Gastrocnemius equinus of left lower extremity   4. Gastrocnemius equinus of right lower extremity      Plan:  Patient was evaluated and treated and all questions answered.  Continues to have persistent ulceration.  We discussed proceeding with surgical intervention despite the wound.  I think this may be risky if there is osteomyelitis.  I am ordering an MRI to evaluate for this and he will return to see me after the MRI for surgical planning.  Return for call after MRI for appointment .

## 2020-11-08 ENCOUNTER — Other Ambulatory Visit: Payer: Self-pay

## 2020-11-08 ENCOUNTER — Ambulatory Visit
Admission: RE | Admit: 2020-11-08 | Discharge: 2020-11-08 | Disposition: A | Discharge status: Home / Self Care | Payer: 59 | Source: Ambulatory Visit | Attending: Podiatry | Admitting: Podiatry

## 2020-11-08 DIAGNOSIS — M25475 Effusion, left foot: Secondary | ICD-10-CM | POA: Diagnosis not present

## 2020-11-08 DIAGNOSIS — M86172 Other acute osteomyelitis, left ankle and foot: Secondary | ICD-10-CM | POA: Diagnosis not present

## 2020-11-08 DIAGNOSIS — L97509 Non-pressure chronic ulcer of other part of unspecified foot with unspecified severity: Secondary | ICD-10-CM | POA: Diagnosis not present

## 2020-11-08 DIAGNOSIS — M7989 Other specified soft tissue disorders: Secondary | ICD-10-CM | POA: Diagnosis not present

## 2020-11-08 DIAGNOSIS — L97522 Non-pressure chronic ulcer of other part of left foot with fat layer exposed: Secondary | ICD-10-CM

## 2020-11-08 IMAGING — MR MR TOES*L* W/O CM
4 of 5 series · 18 of 40 positions shown · non-contrast
Comparison: Radiographs [DATE]

CLINICAL DATA: Foot swelling, ulceration of the left second toe,
possible osteomyelitis

EXAM:
MRI OF THE LEFT TOES WITHOUT CONTRAST
TECHNIQUE: Multiplanar, multisequence MR imaging of the left toes was
performed. No intravenous contrast was administered.

[Series 3: T1 · coronal · 3.0mm · 0.22mm/px · 3 of 44 slices shown (1 of 2)]
[im 5/44]
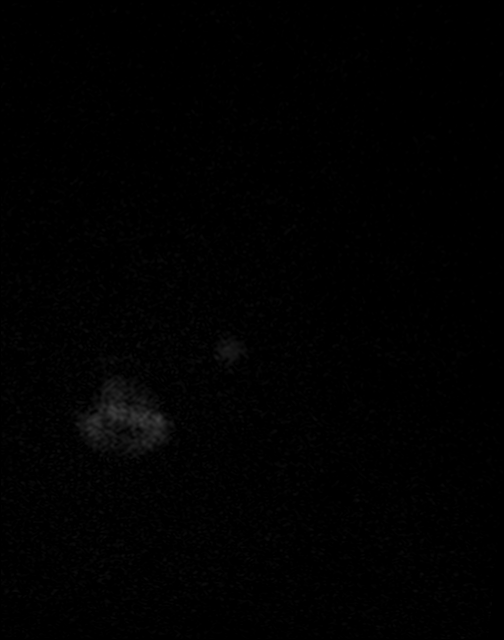
[im 24/44]
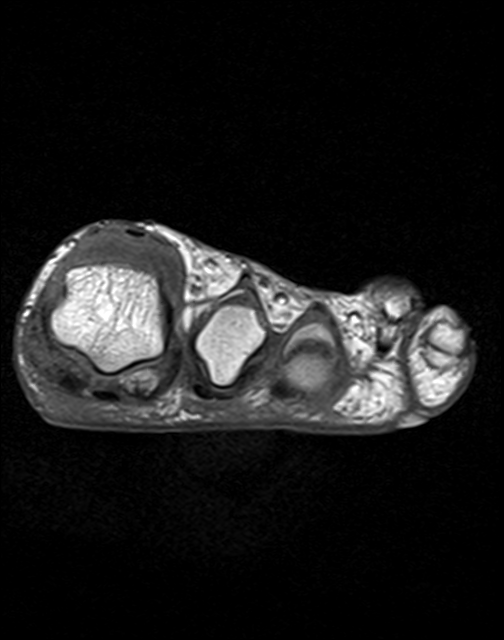
[im 39/44]
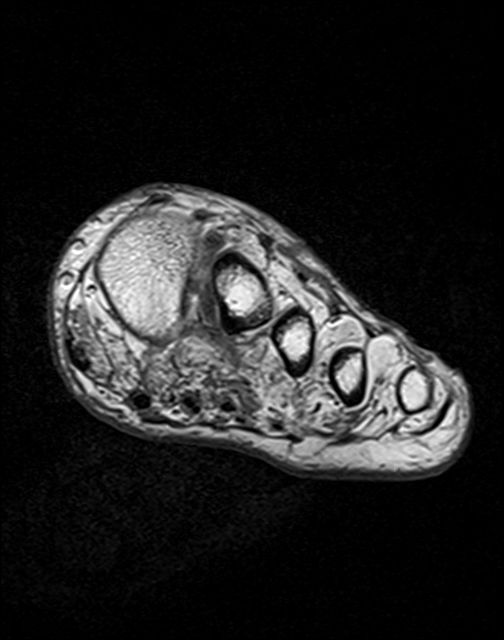

[Series 4: T2 fat-sat · coronal · 3.0mm · 0.22mm/px · 9 of 44 slices shown (1 of 2)]
[im 1/44]
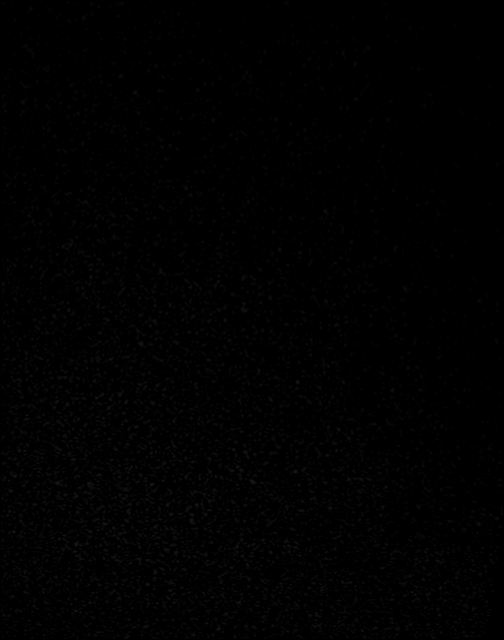
[im 5/44]
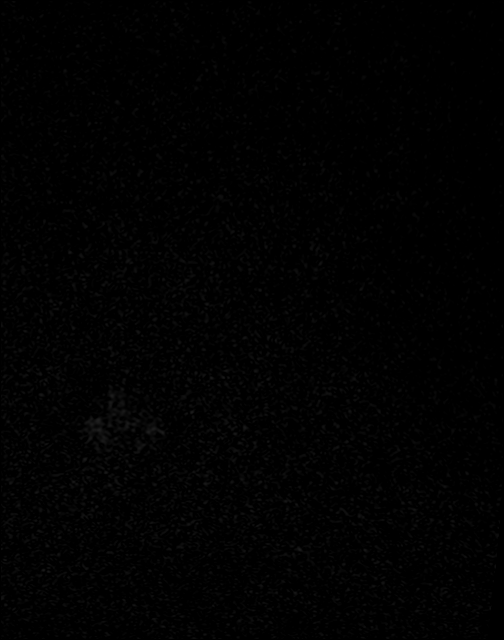
[im 9/44]
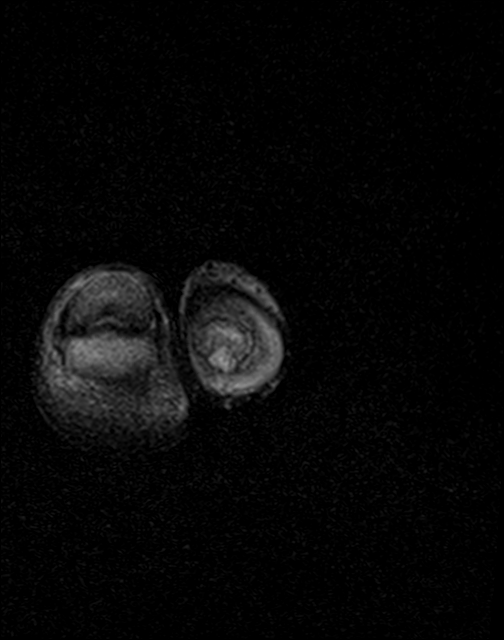
[im 13/44]
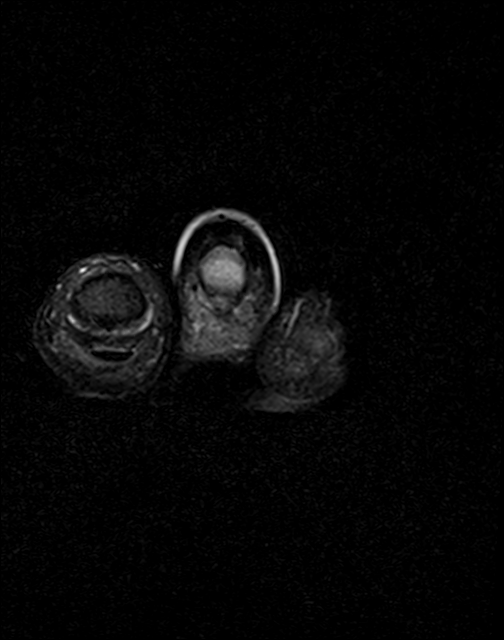
[im 18/44]
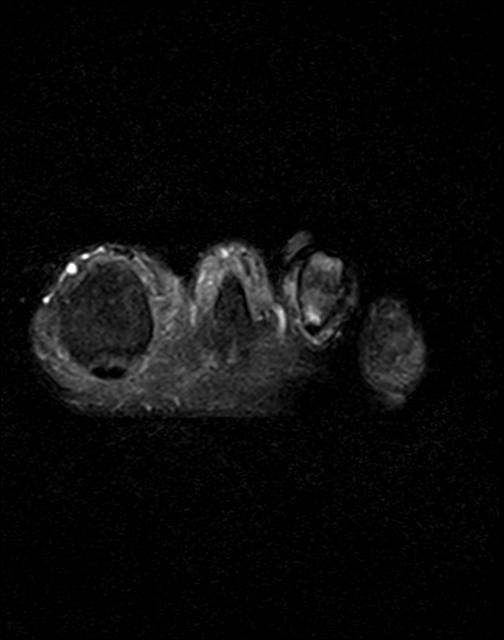
[im 22/44]
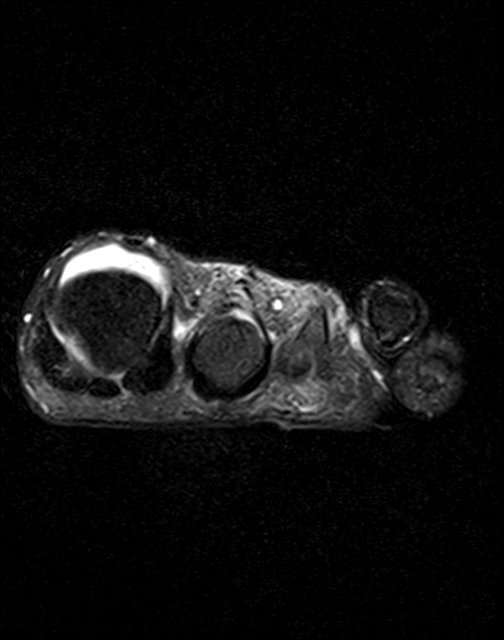
[im 26/44]
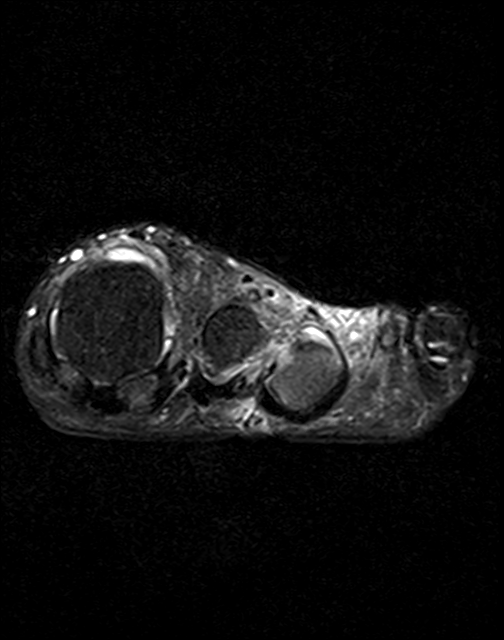
[im 31/44]
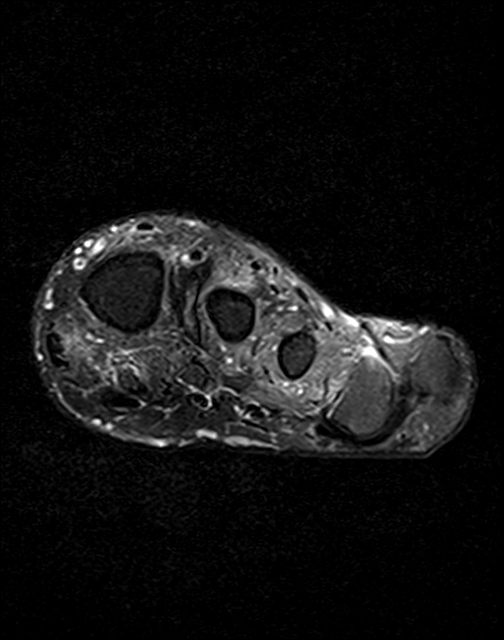
[im 39/44]
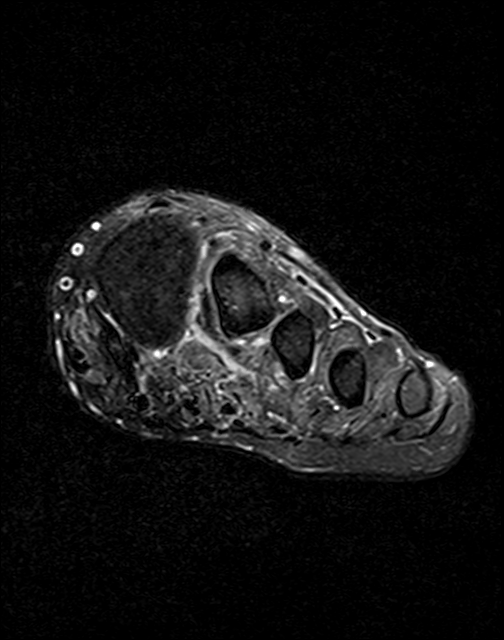

[Series 5: T2 fat-sat · axial · 3.0mm · 0.35mm/px · z∈[-55,+14]mm · 3 of 19 slices shown (2 of 2)]
[im 1/19]
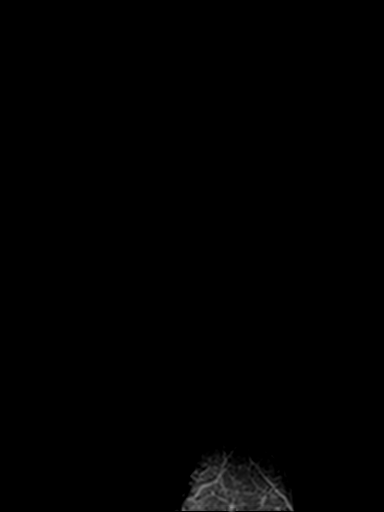
[im 10/19]
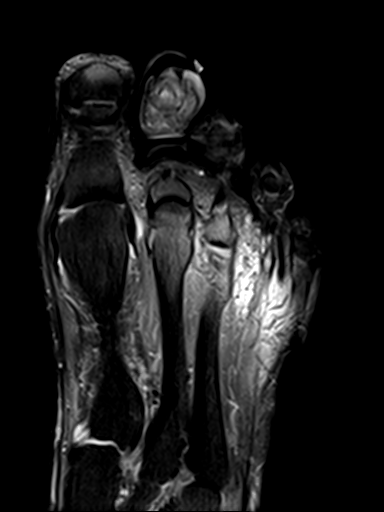
[im 19/19]
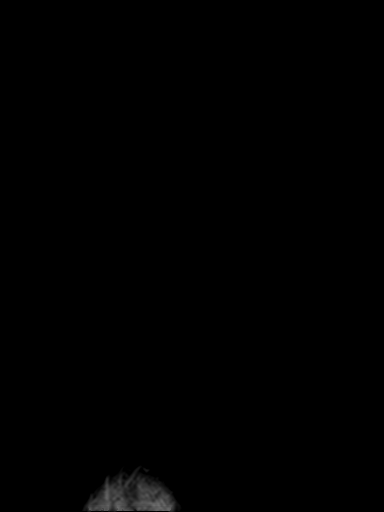

[Series 6: T1 · axial · 3.0mm · 0.35mm/px · z∈[-46,+20]mm · 3 of 22 slices shown (2 of 2)]
[im 5/22]
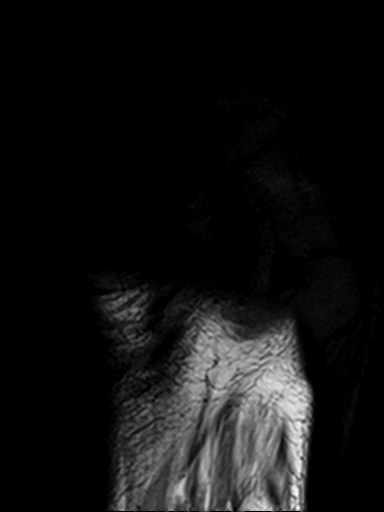
[im 13/22]
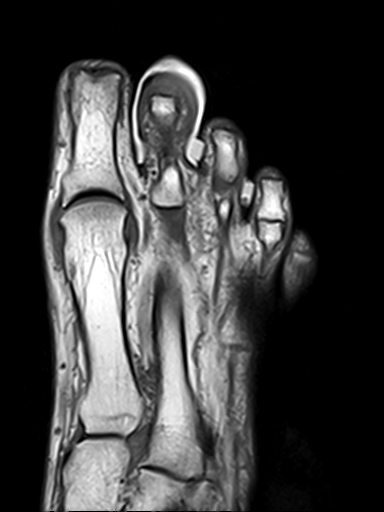
[im 22/22]
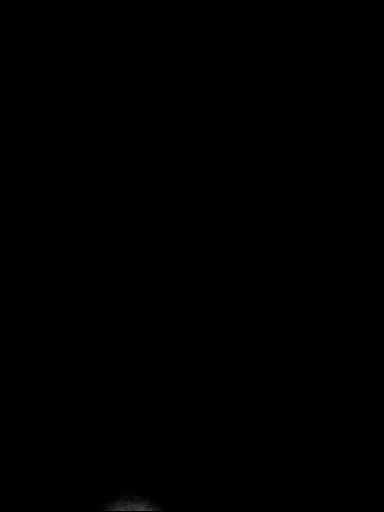

[18 of 40 positions shown; findings below may reference images not displayed]

FINDINGS: Bones/Joint/Cartilage

Diffuse abnormal edema in the distal phalanx of the second toe
compatible with active osteomyelitis. Cutaneous irregularity
suggesting old serration along the tip of the second toe.

Small effusion of the first MTP joint, without reactive findings in
the adjacent bony structures to suggest septic joint. Bifid lateral
sesamoid of the first digit.

Hyperflexed proximal interphalangeal joints especially of the first,
second, and third digits, suspicious for hammertoe deformities.

Ligaments

The Lisfranc ligament appears grossly intact.

Muscles and Tendons

Distal forefoot muscular atrophy diffusely.

Soft tissues

Edema in the second toe likely reflecting cellulitis. There is also
some plantar edema below the head of the fourth metatarsal on image
14 series 3 which could reflect low-grade inflammation in the
subcutaneous tissues.

Note is made of a spacer below the toes with a ring wrapping around
the second toe, and bandaging or a small splint around the second
toe.
IMPRESSION: 1. Active osteomyelitis of the distal phalanx second toe. Overlying
ulceration of the tip of the second toe.
2. Small effusion of the first MTP joint, without adjacent reactive
bony findings to suggest septic joint.
3. Regional muscular atrophy.
4. Subcutaneous edema plantar to the fourth metatarsal head,
low-grade inflammation in this vicinity is not excluded.
5. Suspected hammertoe deformities especially of first, second, and
third toes.

## 2020-11-09 ENCOUNTER — Encounter: Payer: Self-pay | Admitting: Podiatry

## 2020-11-10 ENCOUNTER — Other Ambulatory Visit: Payer: Self-pay | Admitting: Podiatry

## 2020-11-10 MED ORDER — DOXYCYCLINE HYCLATE 100 MG PO TABS: 100.0000 mg | ORAL_TABLET | Freq: Two times a day (BID) | ORAL | 0 refills | Status: DC

## 2020-11-10 NOTE — Telephone Encounter (Signed)
Please advise 

## 2020-11-14 ENCOUNTER — Ambulatory Visit: Payer: 59 | Admitting: Podiatry

## 2020-11-14 ENCOUNTER — Other Ambulatory Visit: Payer: Self-pay

## 2020-11-14 DIAGNOSIS — L97522 Non-pressure chronic ulcer of other part of left foot with fat layer exposed: Secondary | ICD-10-CM

## 2020-11-14 DIAGNOSIS — M2042 Other hammer toe(s) (acquired), left foot: Secondary | ICD-10-CM | POA: Diagnosis not present

## 2020-11-15 ENCOUNTER — Encounter: Payer: Self-pay | Admitting: Podiatry

## 2020-11-15 ENCOUNTER — Telehealth: Payer: Self-pay

## 2020-11-15 NOTE — Progress Notes (Signed)
Subjective:  Patient ID: Carl Bruce, male    DOB: 1975/06/30,  MRN: [id number]  Chief Complaint  Patient presents with   Myrtie Neither    Surgery consent    45 y.o. male presents with the above complaint. History confirmed with patient.  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  His toes are very contracted and have difficulty rubbing in his work boots he works as an Architectural technologist.  He continuously develops ulcerations on the tips of the toes left is always the worst  Interval history: Since last visit he completed the MRI  Objective:  Physical Exam: warm, good capillary refill, no trophic changes or ulcerative lesions, normal DP and PT pulses, and normal sensory exam.  Severe rigid hammertoe deformities bilaterally 2 through 5 worse on the left side and there is a full-thickness ulceration at the tip of the second toe and preulcerative callus plantar third MTPJ.  Granular wound base no signs of infection.  Approximately 1 cm diameter does not probe to bone.  He has equinus. Radiographs: Multiple views x-ray of both feet: Rigid hammertoe deformities  Study Result  Narrative & Impression  CLINICAL DATA:  Foot swelling, ulceration of the left second toe, possible osteomyelitis   EXAM: MRI OF THE LEFT TOES WITHOUT CONTRAST   TECHNIQUE: Multiplanar, multisequence MR imaging of the left toes was performed. No intravenous contrast was administered.   COMPARISON:  Radiographs 10/03/2020   FINDINGS: Bones/Joint/Cartilage   Diffuse abnormal edema in the distal phalanx of the second toe compatible with active osteomyelitis. Cutaneous irregularity suggesting old serration along the tip of the second toe.   Small effusion of the first MTP joint, without reactive findings in the adjacent bony structures to suggest septic joint. Bifid lateral sesamoid of the first digit.   Hyperflexed proximal  interphalangeal joints especially of the first, second, and third digits, suspicious for hammertoe deformities.   Ligaments   The Lisfranc ligament appears grossly intact.   Muscles and Tendons   Distal forefoot muscular atrophy diffusely.   Soft tissues   Edema in the second toe likely reflecting cellulitis. There is also some plantar edema below the head of the fourth metatarsal on image 14 series 3 which could reflect low-grade inflammation in the subcutaneous tissues.   Note is made of a spacer below the toes with a ring wrapping around the second toe, and bandaging or a small splint around the second toe.   IMPRESSION: 1. Active osteomyelitis of the distal phalanx second toe. Overlying ulceration of the tip of the second toe. 2. Small effusion of the first MTP joint, without adjacent reactive bony findings to suggest septic joint. 3. Regional muscular atrophy. 4. Subcutaneous edema plantar to the fourth metatarsal head, low-grade inflammation in this vicinity is not excluded. 5. Suspected hammertoe deformities especially of first, second, and third toes.     Electronically Signed   By: Gaylyn Rong M.D.   On: 11/08/2020 11:42     Assessment:   1. Skin ulcer of toe of left foot with fat layer exposed (HCC)   2. Hammertoe of left foot      Plan:  Patient was evaluated and treated and all questions answered.  Reviewed the MRI findings.  I recommended partial amputation of the left second toe.  We discussed the risk benefits and potential complications of this as well as the postoperative course.  Surgical shoe was dispensed.  Surgery  scheduled for Friday.  Informed consent was signed and reviewed.  We will plan for amputation to eradicate the infection and then plan with reconstruction for the remainder of the forefoot at a later date.  A wound culture after sterile prep with Betadine was taken  No follow-ups on file.

## 2020-11-15 NOTE — Telephone Encounter (Signed)
DOS 11/18/2020  AMPUTATION IPJ 2ND LT - 29562  [facility] EFFECTIVE DATE - 02/06/2020  PLAN DEDUCTIBLE - $300.00 W/ $83.06 REMAINING OUT OF POCKET - $600.00 W/ $0.00 REMANINING COPAY $0.00 COINSURANCE - 60%  SPOKE TO [facility] AT [facility], NO PRECERT REQUIRED FOR CPT [phone]. CALL REF # [id number]

## 2020-11-17 LAB — WOUND CULTURE
MICRO NUMBER:: 12482611
SPECIMEN QUALITY:: ADEQUATE

## 2020-11-18 ENCOUNTER — Other Ambulatory Visit: Payer: Self-pay | Admitting: Podiatry

## 2020-11-18 DIAGNOSIS — L97529 Non-pressure chronic ulcer of other part of left foot with unspecified severity: Secondary | ICD-10-CM | POA: Diagnosis not present

## 2020-11-18 DIAGNOSIS — M86472 Chronic osteomyelitis with draining sinus, left ankle and foot: Secondary | ICD-10-CM | POA: Diagnosis not present

## 2020-11-18 DIAGNOSIS — M86672 Other chronic osteomyelitis, left ankle and foot: Secondary | ICD-10-CM | POA: Diagnosis not present

## 2020-11-18 MED ORDER — ACETAMINOPHEN 500 MG PO TABS: 1000.0000 mg | ORAL_TABLET | Freq: Four times a day (QID) | ORAL | 0 refills | Status: AC | PRN

## 2020-11-18 MED ORDER — OXYCODONE HCL 5 MG PO TABS: 5.0000 mg | ORAL_TABLET | ORAL | 0 refills | Status: AC | PRN

## 2020-11-18 MED ORDER — IBUPROFEN 600 MG PO TABS: 600.0000 mg | ORAL_TABLET | Freq: Four times a day (QID) | ORAL | 0 refills | Status: AC | PRN

## 2020-11-18 MED ORDER — DOXYCYCLINE HYCLATE 100 MG PO TABS: 100.0000 mg | ORAL_TABLET | Freq: Two times a day (BID) | ORAL | 0 refills | Status: DC

## 2020-11-23 ENCOUNTER — Encounter: Payer: Self-pay | Admitting: Podiatry

## 2020-11-24 ENCOUNTER — Other Ambulatory Visit: Payer: Self-pay

## 2020-11-24 ENCOUNTER — Ambulatory Visit (INDEPENDENT_AMBULATORY_CARE_PROVIDER_SITE_OTHER): Payer: 59 | Admitting: Podiatry

## 2020-11-24 DIAGNOSIS — L97522 Non-pressure chronic ulcer of other part of left foot with fat layer exposed: Secondary | ICD-10-CM

## 2020-11-24 NOTE — Progress Notes (Signed)
  Subjective:  Patient ID: Carl Bruce, male    DOB: Nov 30, 1975,  MRN: [id number]  Chief Complaint  Patient presents with   Toe Pain    PO visit, partial left second toe amputation. Patient reports mild pain, reports no fever or chills.    DOS: 11/18/2020 Procedure: Partial left second toe amputation  45 y.o. male returns for post-op check.  Overall doing well  Review of Systems: Negative except as noted in the HPI. Denies N/V/F/Ch.   Objective:  There were no vitals filed for this visit. There is no height or weight on file to calculate BMI. Constitutional Well developed. Well nourished.  Vascular Foot warm and well perfused. Capillary refill normal to all digits.   Neurologic Normal speech. Oriented to person, place, and time. Epicritic sensation to light touch grossly present bilaterally.  Dermatologic Skin healing well without signs of infection. Skin edges well coapted without signs of infection.  Orthopedic: Tenderness to palpation noted about the surgical site.   Path results with acute and chronic inflammation of the skin and bone consistent with osteomyelitis  Assessment:   1. Skin ulcer of toe of left foot with fat layer exposed (HCC)    Plan:  Patient was evaluated and treated and all questions answered.  Overall doing well he can dress with simply a Band-Aid now continue bathing and no soaking but can shower at this point.  Remove sutures in 2 weeks.  WBAT in open toed shoes such as a slide.  Unfortunate there is not a large enough shoe made to accommodate him.  We will likely have to custom order a shoe prior to surgery for his reconstruction  Return in about 2 weeks (around 12/08/2020) for suture removal.

## 2020-11-25 DIAGNOSIS — G4733 Obstructive sleep apnea (adult) (pediatric): Secondary | ICD-10-CM | POA: Diagnosis not present

## 2020-12-08 ENCOUNTER — Other Ambulatory Visit: Payer: Self-pay

## 2020-12-08 ENCOUNTER — Ambulatory Visit (INDEPENDENT_AMBULATORY_CARE_PROVIDER_SITE_OTHER): Payer: 59 | Admitting: Podiatry

## 2020-12-08 DIAGNOSIS — M2042 Other hammer toe(s) (acquired), left foot: Secondary | ICD-10-CM

## 2020-12-08 DIAGNOSIS — L97522 Non-pressure chronic ulcer of other part of left foot with fat layer exposed: Secondary | ICD-10-CM

## 2020-12-08 DIAGNOSIS — M21862 Other specified acquired deformities of left lower leg: Secondary | ICD-10-CM

## 2020-12-08 MED ORDER — CEPHALEXIN 500 MG PO CAPS
500.0000 mg | ORAL_CAPSULE | Freq: Four times a day (QID) | ORAL | 0 refills | Status: AC
Start: 2020-12-08 — End: 2020-12-22

## 2020-12-12 NOTE — Progress Notes (Signed)
  Subjective:  Patient ID: Carl Bruce, male    DOB: December 07, 1975,  MRN: [id number]  No chief complaint on file.   DOS: 11/18/2020 Procedure: Partial left second toe amputation  45 y.o. male returns for post-op check.  Overall doing well  Review of Systems: Negative except as noted in the HPI. Denies N/V/F/Ch.   Objective:  There were no vitals filed for this visit. There is no height or weight on file to calculate BMI. Constitutional Well developed. Well nourished.  Vascular Foot warm and well perfused. Capillary refill normal to all digits.   Neurologic Normal speech. Oriented to person, place, and time. Epicritic sensation to light touch grossly present bilaterally.  Dermatologic Skin healing well without signs of infection. Skin edges well coapted without signs of infection.  Orthopedic: Tenderness to palpation noted about the surgical site.   Path results with acute and chronic inflammation of the skin and bone consistent with osteomyelitis  Assessment:   1. Skin ulcer of toe of left foot with fat layer exposed (HCC)   2. Hammertoe of left foot   3. Gastrocnemius equinus of left lower extremity    Plan:  Patient was evaluated and treated and all questions answered.  Sutures removed today.  He is ready to plan for the next date of surgery.  We will plan for this in December.  I discussed the risk benefits and potential complications of the procedure including but not limited to pain, swelling, infection, scar, numbness which may be temporary or permanent, chronic pain, stiffness, nerve pain or damage, wound healing problems, bone healing problems including delayed or non-union.  Surgical we discussed correction of the hammertoe deformities, metatarsal osteotomies, pinning of the toes and gastrocnemius recession.  He understands and wishes to proceed.  Informed consent was signed and reviewed.  All questions were addressed.   Surgical plan:  Procedure: -Left foot  hammertoe correction 2 through 5, metatarsal osteotomy 2 through 5, gastrocnemius recession  Location: -[facility]  Anesthesia plan: -IV sedation with ankle block  Postoperative pain plan: - Tylenol 1000 mg every 6 hours, ibuprofen 600 mg every 6 hours, gabapentin 300 mg every 8 hours x5 days, oxycodone 5 mg 1-2 tabs every 6 hours only as needed  DVT prophylaxis: -None required  WB Restrictions / DME needs: -WBAT in CAM boot, special order CAM boot was dispensed today    Return in 19 days (on 12/27/2020) for post op (no x-rays).

## 2020-12-18 ENCOUNTER — Other Ambulatory Visit (HOSPITAL_COMMUNITY): Payer: Self-pay

## 2020-12-19 ENCOUNTER — Other Ambulatory Visit (HOSPITAL_COMMUNITY): Payer: Self-pay

## 2020-12-26 DIAGNOSIS — G4733 Obstructive sleep apnea (adult) (pediatric): Secondary | ICD-10-CM | POA: Diagnosis not present

## 2020-12-27 ENCOUNTER — Encounter: Payer: Self-pay | Admitting: Podiatry

## 2020-12-27 ENCOUNTER — Telehealth: Payer: Self-pay | Admitting: Urology

## 2020-12-27 ENCOUNTER — Other Ambulatory Visit: Payer: Self-pay

## 2020-12-27 ENCOUNTER — Ambulatory Visit (INDEPENDENT_AMBULATORY_CARE_PROVIDER_SITE_OTHER): Payer: 59 | Admitting: Podiatry

## 2020-12-27 DIAGNOSIS — L97522 Non-pressure chronic ulcer of other part of left foot with fat layer exposed: Secondary | ICD-10-CM

## 2020-12-27 NOTE — Progress Notes (Signed)
  Subjective:  Patient ID: Carl Bruce, male    DOB: 09-11-1975,  MRN: [id number]  Chief Complaint  Patient presents with   Routine Post Op    POV #3 DOS 11/18/2020 2ND TOE PARTIAL AMPUTATION     DOS: 11/18/2020 Procedure: Partial left second toe amputation  45 y.o. male returns for post-op check.  Overall doing well  Review of Systems: Negative except as noted in the HPI. Denies N/V/F/Ch.   Objective:  There were no vitals filed for this visit. There is no height or weight on file to calculate BMI. Constitutional Well developed. Well nourished.  Vascular Foot warm and well perfused. Capillary refill normal to all digits.   Neurologic Normal speech. Oriented to person, place, and time. Epicritic sensation to light touch grossly present bilaterally.  Dermatologic Small area of delayed healing lateral portion of the incision otherwise well-healed  Orthopedic: Tenderness to palpation noted about the surgical site.   Path results with acute and chronic inflammation of the skin and bone consistent with osteomyelitis  Assessment:   1. Skin ulcer of toe of left foot with fat layer exposed (HCC)    Plan:  Patient was evaluated and treated and all questions answered.  Debrided the area of delayed healing today of hyperkeratotic and nonviable tissue.  They will continue dressing with Betadine ointment.  We then discussed the surgery that is planned for December 16.  He would like to go back to work as soon as possible.  I discussed with him that if he is able to demonstrate that he can stay off the foot keep it elevated he may go back to work shortly after surgery but we discussed the risk of this including increased welling and complications of bone and incision healing.  My preference if he is able to take 5 to 6 weeks off work but he does not think this would be doable.  I am willing to be flexible on this if he can safely maintain the foot in an elevated position   Return in  2 weeks (on 01/10/2021) for wound check.

## 2020-12-27 NOTE — Telephone Encounter (Addendum)
DOS - 03/24/21  METATARSAL OSTEOTOMY 2-5 LEFT --- 28308 GASTROCNEMIUS RECESS LEFT --- 30160 CAPSULOTOMY MPJ JOINT RELEASE 2-5 LEFT --- 10932 HAMMERTOE REPAIR 2-5 LEFT --- 35573   [facility] EFFECTIVE DATE - 02/06/20  PLAN DEDUCTIBLE - $300.00 $83.06 REMAINING OUT OF POCKET - $7900.00 W/ $2,202.54 REMAINING COINSURANCE - 40% COPAY - $0.00   SPOKE WITH KEASIA WITH [facility] AND SHE STATED THAT FOR CPT CODES 27062 , 37628, 31517 AND [phone] NO PRIOR AUTH IS REQUIRED.   REF # [id number]

## 2020-12-30 DIAGNOSIS — Z720 Tobacco use: Secondary | ICD-10-CM | POA: Diagnosis not present

## 2020-12-30 DIAGNOSIS — E785 Hyperlipidemia, unspecified: Secondary | ICD-10-CM | POA: Diagnosis not present

## 2020-12-30 DIAGNOSIS — R569 Unspecified convulsions: Secondary | ICD-10-CM | POA: Diagnosis not present

## 2020-12-30 DIAGNOSIS — R519 Headache, unspecified: Secondary | ICD-10-CM | POA: Diagnosis not present

## 2020-12-30 DIAGNOSIS — F32A Depression, unspecified: Secondary | ICD-10-CM | POA: Diagnosis not present

## 2020-12-30 DIAGNOSIS — R55 Syncope and collapse: Secondary | ICD-10-CM | POA: Diagnosis not present

## 2020-12-30 DIAGNOSIS — R410 Disorientation, unspecified: Secondary | ICD-10-CM | POA: Diagnosis not present

## 2020-12-30 DIAGNOSIS — E860 Dehydration: Secondary | ICD-10-CM | POA: Diagnosis not present

## 2020-12-30 DIAGNOSIS — Q8740 Marfan's syndrome, unspecified: Secondary | ICD-10-CM | POA: Diagnosis not present

## 2021-01-03 ENCOUNTER — Other Ambulatory Visit: Payer: Self-pay | Admitting: Family Medicine

## 2021-01-03 DIAGNOSIS — R404 Transient alteration of awareness: Secondary | ICD-10-CM

## 2021-01-09 ENCOUNTER — Encounter: Payer: Self-pay | Admitting: Neurology

## 2021-01-09 ENCOUNTER — Ambulatory Visit: Payer: 59 | Admitting: Neurology

## 2021-01-09 ENCOUNTER — Other Ambulatory Visit: Payer: Self-pay

## 2021-01-09 VITALS — BP 146/71 | HR 62 | Ht >= 80 in | Wt 309.0 lb

## 2021-01-09 DIAGNOSIS — Z87828 Personal history of other (healed) physical injury and trauma: Secondary | ICD-10-CM

## 2021-01-09 DIAGNOSIS — G40909 Epilepsy, unspecified, not intractable, without status epilepticus: Secondary | ICD-10-CM

## 2021-01-09 MED ORDER — LAMOTRIGINE 25 MG PO TABS: ORAL_TABLET | ORAL | 0 refills | Status: DC

## 2021-01-09 MED ORDER — LAMOTRIGINE 100 MG PO TABS: 100.0000 mg | ORAL_TABLET | Freq: Two times a day (BID) | ORAL | 11 refills | Status: DC

## 2021-01-09 NOTE — Patient Instructions (Signed)
weeks Keppra 500mg   Lamotrigine 25mg   1st 1/1 1/1  2nd 1/1 2/2  3rd 0/1 3/3  4th 0/0 Lamotrigine 100mg  bid
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
## 2021-01-10 ENCOUNTER — Other Ambulatory Visit: Payer: Self-pay

## 2021-01-10 ENCOUNTER — Encounter: Payer: Self-pay | Admitting: Podiatry

## 2021-01-10 ENCOUNTER — Ambulatory Visit: Payer: 59 | Admitting: Podiatry

## 2021-01-10 DIAGNOSIS — L97522 Non-pressure chronic ulcer of other part of left foot with fat layer exposed: Secondary | ICD-10-CM | POA: Diagnosis not present

## 2021-01-10 DIAGNOSIS — M21862 Other specified acquired deformities of left lower leg: Secondary | ICD-10-CM | POA: Diagnosis not present

## 2021-01-10 DIAGNOSIS — M2042 Other hammer toe(s) (acquired), left foot: Secondary | ICD-10-CM | POA: Diagnosis not present

## 2021-01-10 MED ORDER — DOXYCYCLINE HYCLATE 100 MG PO TABS: 100.0000 mg | ORAL_TABLET | Freq: Two times a day (BID) | ORAL | 0 refills | Status: AC

## 2021-01-12 ENCOUNTER — Other Ambulatory Visit: Payer: Self-pay | Admitting: Nurse Practitioner

## 2021-01-12 MED ORDER — LEVETIRACETAM 500 MG PO TABS: 500.0000 mg | ORAL_TABLET | Freq: Two times a day (BID) | ORAL | 0 refills | Status: DC

## 2021-01-12 NOTE — Progress Notes (Signed)
  Subjective:  Patient ID: Carl Bruce, male    DOB: March 31, 1975,  MRN: [id number]  No chief complaint on file.    DOS: 11/18/2020 Procedure: Partial left second toe amputation  45 y.o. male returns for post-op check.  Overall doing well  Review of Systems: Negative except as noted in the HPI. Denies N/V/F/Ch.   Objective:  There were no vitals filed for this visit. There is no height or weight on file to calculate BMI. Constitutional Well developed. Well nourished.  Vascular Foot warm and well perfused. Capillary refill normal to all digits.   Neurologic Normal speech. Oriented to person, place, and time. Epicritic sensation to light touch grossly present bilaterally.  Dermatologic Small area of delayed healing lateral portion of the incision otherwise well-healed  Orthopedic: Tenderness to palpation noted about the surgical site.   Path results with acute and chronic inflammation of the skin and bone consistent with osteomyelitis  Assessment:   1. Skin ulcer of toe of left foot with fat layer exposed (HCC)   2. Hammertoe of left foot   3. Gastrocnemius equinus of left lower extremity     Plan:  Patient was evaluated and treated and all questions answered.  Debrided the area of delayed healing today of hyperkeratotic and nonviable tissue.  They will continue dressing now with Iodosorb ointment.  surgery is planned for December 16.  He said he would be willing to take 3 weeks off work which I think is reasonable.  Ideally 6 would be better but I think there would be okay and he is able to still wear the boot when he goes back.  Due to the area of delayed healing I will take to keep him on antibiotics until surgery.  No follow-ups on file.

## 2021-01-17 ENCOUNTER — Ambulatory Visit: Payer: 59 | Admitting: Neurology

## 2021-01-17 DIAGNOSIS — Z87828 Personal history of other (healed) physical injury and trauma: Secondary | ICD-10-CM

## 2021-01-17 DIAGNOSIS — G40909 Epilepsy, unspecified, not intractable, without status epilepticus: Secondary | ICD-10-CM | POA: Diagnosis not present

## 2021-01-19 ENCOUNTER — Other Ambulatory Visit: Payer: 59 | Admitting: *Deleted

## 2021-01-20 ENCOUNTER — Telehealth: Payer: Self-pay | Admitting: Urology

## 2021-01-20 ENCOUNTER — Encounter: Payer: Self-pay | Admitting: Podiatry

## 2021-01-20 NOTE — Procedures (Signed)
° °  HISTORY: 45 year old male, history of traumatic brain injury, presented with recurrent seizure  TECHNIQUE:  This is a routine 16 channel EEG recording with one channel devoted to a limited EKG recording.  It was performed during wakefulness, drowsiness and asleep.  Hyperventilation and photic stimulation were performed as activating procedures.  There are minimum muscle and movement artifact noted.  Upon maximum arousal, posterior dominant waking rhythm consistent of mildly dysarrhythmic alpha range activity,  Activities are symmetric over the bilateral posterior derivations and attenuated with eye opening.  Hyperventilation produced mild/moderate buildup with higher amplitude and the slower activities noted.  Photic stimulation did not alter the tracing.  During EEG recording, patient developed drowsiness and no deeper stage of sleep was achieved.  During EEG recording, there was no epileptiform discharge noted.  EKG demonstrate sinus rhythm, with heart rate of 56 pbm  CONCLUSION: This is a  normal awake EEG.  There is no electrodiagnostic evidence of epileptiform discharge.  Levert Feinstein, M.D. Ph.D.  [facility] [address] [place], [place] [address] Phone: [phone] Fax:      [phone]

## 2021-01-20 NOTE — Telephone Encounter (Signed)
Aram Beecham with [facility] called stating that pt showed up today for his sx eating so we need to reschedule to another day. I called pt with no answer and left him a message to call me back to reschedule his sx.

## 2021-01-22 NOTE — Telephone Encounter (Signed)
Please advise 

## 2021-01-24 ENCOUNTER — Telehealth: Payer: Self-pay | Admitting: Urology

## 2021-01-24 NOTE — Telephone Encounter (Signed)
ERROR

## 2021-01-25 DIAGNOSIS — G4733 Obstructive sleep apnea (adult) (pediatric): Secondary | ICD-10-CM | POA: Diagnosis not present

## 2021-01-26 ENCOUNTER — Encounter: Payer: 59 | Admitting: Podiatry

## 2021-01-26 DIAGNOSIS — G4733 Obstructive sleep apnea (adult) (pediatric): Secondary | ICD-10-CM | POA: Diagnosis not present

## 2021-01-27 ENCOUNTER — Other Ambulatory Visit: Payer: Self-pay | Admitting: Family Medicine

## 2021-01-27 MED ORDER — LEVETIRACETAM 500 MG PO TABS: 500.0000 mg | ORAL_TABLET | Freq: Two times a day (BID) | ORAL | 1 refills | Status: DC

## 2021-01-31 ENCOUNTER — Encounter: Payer: Self-pay | Admitting: Neurology

## 2021-02-09 ENCOUNTER — Encounter: Payer: 59 | Admitting: Podiatry

## 2021-02-23 ENCOUNTER — Other Ambulatory Visit (HOSPITAL_COMMUNITY): Payer: Self-pay

## 2021-02-23 ENCOUNTER — Other Ambulatory Visit: Payer: Self-pay | Admitting: Family Medicine

## 2021-02-23 ENCOUNTER — Encounter: Payer: 59 | Admitting: Podiatry

## 2021-02-23 MED ORDER — LEVETIRACETAM 500 MG PO TABS
500.0000 mg | ORAL_TABLET | Freq: Two times a day (BID) | ORAL | 3 refills | Status: DC
  Filled 2021-02-23: qty 180, 90d supply, fill #0
  Filled 2021-05-21: qty 180, 90d supply, fill #1
  Filled 2021-08-20: qty 180, 90d supply, fill #2
  Filled 2021-11-26: qty 180, 90d supply, fill #3

## 2021-03-07 ENCOUNTER — Encounter: Payer: 59 | Admitting: Podiatry

## 2021-03-08 ENCOUNTER — Encounter: Payer: Self-pay | Admitting: Podiatry

## 2021-03-08 NOTE — Telephone Encounter (Signed)
Please advise 

## 2021-03-10 ENCOUNTER — Encounter: Payer: 59 | Admitting: Podiatry

## 2021-03-24 ENCOUNTER — Other Ambulatory Visit: Payer: Self-pay | Admitting: Podiatry

## 2021-03-24 ENCOUNTER — Encounter: Payer: Self-pay | Admitting: Podiatry

## 2021-03-24 DIAGNOSIS — G8918 Other acute postprocedural pain: Secondary | ICD-10-CM | POA: Diagnosis not present

## 2021-03-24 DIAGNOSIS — M216X2 Other acquired deformities of left foot: Secondary | ICD-10-CM | POA: Diagnosis not present

## 2021-03-24 DIAGNOSIS — M24575 Contracture, left foot: Secondary | ICD-10-CM | POA: Diagnosis not present

## 2021-03-24 DIAGNOSIS — M79676 Pain in unspecified toe(s): Secondary | ICD-10-CM

## 2021-03-24 DIAGNOSIS — M25572 Pain in left ankle and joints of left foot: Secondary | ICD-10-CM | POA: Diagnosis not present

## 2021-03-24 DIAGNOSIS — M205X2 Other deformities of toe(s) (acquired), left foot: Secondary | ICD-10-CM | POA: Diagnosis not present

## 2021-03-24 DIAGNOSIS — M2042 Other hammer toe(s) (acquired), left foot: Secondary | ICD-10-CM | POA: Diagnosis not present

## 2021-03-24 DIAGNOSIS — M21542 Acquired clubfoot, left foot: Secondary | ICD-10-CM | POA: Diagnosis not present

## 2021-03-24 MED ORDER — DOXYCYCLINE HYCLATE 100 MG PO TABS
100.0000 mg | ORAL_TABLET | Freq: Two times a day (BID) | ORAL | 0 refills | Status: DC
Start: 2021-03-24 — End: 2021-08-01

## 2021-03-24 MED ORDER — GABAPENTIN 300 MG PO CAPS: 300.0000 mg | ORAL_CAPSULE | Freq: Three times a day (TID) | ORAL | 0 refills | Status: DC

## 2021-03-24 MED ORDER — ACETAMINOPHEN 500 MG PO TABS: 1000.0000 mg | ORAL_TABLET | Freq: Four times a day (QID) | ORAL | 0 refills | Status: AC | PRN

## 2021-03-24 MED ORDER — OXYCODONE HCL 5 MG PO TABS: 5.0000 mg | ORAL_TABLET | ORAL | 0 refills | Status: AC | PRN

## 2021-03-24 MED ORDER — IBUPROFEN 600 MG PO TABS: 600.0000 mg | ORAL_TABLET | Freq: Four times a day (QID) | ORAL | 0 refills | Status: AC | PRN

## 2021-03-24 NOTE — Progress Notes (Signed)
03/24/21 hammertoe correction 2,3,4,5, metatarsal osteotomies 2,3,4,5, gastrocnemius recession

## 2021-03-27 ENCOUNTER — Other Ambulatory Visit: Payer: Self-pay | Admitting: Nurse Practitioner

## 2021-03-27 ENCOUNTER — Other Ambulatory Visit (HOSPITAL_COMMUNITY): Payer: Self-pay

## 2021-03-27 DIAGNOSIS — I10 Essential (primary) hypertension: Secondary | ICD-10-CM

## 2021-03-27 DIAGNOSIS — E782 Mixed hyperlipidemia: Secondary | ICD-10-CM

## 2021-03-27 MED ORDER — LOSARTAN POTASSIUM 25 MG PO TABS
ORAL_TABLET | Freq: Two times a day (BID) | ORAL | 3 refills | Status: DC
  Filled 2021-03-27: qty 180, 90d supply, fill #0

## 2021-03-27 MED ORDER — ROSUVASTATIN CALCIUM 20 MG PO TABS
20.0000 mg | ORAL_TABLET | Freq: Every day | ORAL | 1 refills | Status: DC
  Filled 2021-03-27: qty 90, 90d supply, fill #0
  Filled 2021-06-26: qty 90, 90d supply, fill #1

## 2021-03-27 NOTE — Telephone Encounter (Signed)
We have not filled losartan before please review

## 2021-03-28 NOTE — Progress Notes (Signed)
DOS 03/24/2021 Left foot hammertoe correction 2,3,4,5; metatarsal osteotomy 2,3,4,5; gastroc nemius (calf muscle) lengthening

## 2021-03-30 ENCOUNTER — Encounter: Payer: 59 | Admitting: Podiatry

## 2021-03-30 ENCOUNTER — Other Ambulatory Visit: Payer: Self-pay

## 2021-03-30 ENCOUNTER — Ambulatory Visit (INDEPENDENT_AMBULATORY_CARE_PROVIDER_SITE_OTHER): Payer: 59

## 2021-03-30 ENCOUNTER — Ambulatory Visit (INDEPENDENT_AMBULATORY_CARE_PROVIDER_SITE_OTHER): Payer: 59 | Admitting: Podiatry

## 2021-03-30 DIAGNOSIS — M21862 Other specified acquired deformities of left lower leg: Secondary | ICD-10-CM

## 2021-03-30 DIAGNOSIS — M24575 Contracture, left foot: Secondary | ICD-10-CM

## 2021-03-30 DIAGNOSIS — M2042 Other hammer toe(s) (acquired), left foot: Secondary | ICD-10-CM

## 2021-03-30 DIAGNOSIS — M21962 Unspecified acquired deformity of left lower leg: Secondary | ICD-10-CM

## 2021-03-30 NOTE — Progress Notes (Signed)
°  Subjective:  Patient ID: Carl Bruce, male    DOB: 06/21/1975,  MRN: [id number]  Chief Complaint  Patient presents with   Routine Post Op     (xray)POV #1 DOS 03/24/2021 LT FOOT HAMMERTOE CORRECTION 2-5, METATARSAL OSTEOTOMY 2-5, GASTROCNEMIUS LENGTHENING     46 y.o. male returns for post-op check.  Having some pain.  He says that he is probably been on the foot more than he should.  Review of Systems: Negative except as noted in the HPI. Denies N/V/F/Ch.   Objective:  There were no vitals filed for this visit. There is no height or weight on file to calculate BMI. Constitutional Well developed. Well nourished.  Vascular Foot warm and well perfused. Capillary refill normal to all digits.  Calf is soft and supple, no posterior calf or knee pain, negative Homans' sign  Neurologic Normal speech. Oriented to person, place, and time. Epicritic sensation to light touch grossly present bilaterally.  Dermatologic Periwound erythema.  Significant tenderness and swelling.  Quite a bit of bloody drainage in the dressing.  No purulence malodor or cellulitis  Orthopedic: Tenderness to palpation noted about the surgical site.  Significant edema   Multiple view plain film radiographs: Status post hammertoe correction with K wire fixation still intact and metatarsal osteotomies Assessment:   1. Hammertoe of left foot   2. Gastrocnemius equinus of left lower extremity   3. Deformity of metatarsal bone of left foot   4. Joint contracture of foot, left    Plan:  Patient was evaluated and treated and all questions answered.  S/p foot surgery left -Progressing as expected post-operatively. -XR: As noted above -WB Status: WBAT in CAM boot.  I advised him he needs to stay off the foot is much as he can and rest I think has been on too much which is caused significant swelling and bloody drainage as well as the erythema and inflammation -Sutures: We will remove in 2 weeks. -Medications: No  refills required -Foot redressed.  Leave dressing intact until next visit and dry -Still completing doxycycline course she will finish this and let me know how he is doing if there is any persistent or increased redness we will renew this for 1 more week.  Return in about 1 week (around 04/06/2021) for post op (no x-rays).

## 2021-04-06 ENCOUNTER — Other Ambulatory Visit: Payer: Self-pay

## 2021-04-06 ENCOUNTER — Ambulatory Visit (INDEPENDENT_AMBULATORY_CARE_PROVIDER_SITE_OTHER): Payer: 59 | Admitting: Podiatry

## 2021-04-06 DIAGNOSIS — M2042 Other hammer toe(s) (acquired), left foot: Secondary | ICD-10-CM

## 2021-04-06 DIAGNOSIS — M21962 Unspecified acquired deformity of left lower leg: Secondary | ICD-10-CM

## 2021-04-06 DIAGNOSIS — M21862 Other specified acquired deformities of left lower leg: Secondary | ICD-10-CM

## 2021-04-06 NOTE — Progress Notes (Signed)
?  Subjective:  ?Patient ID: Carl Bruce, male    DOB: 1975/03/20,  MRN: [id number] ? ?Chief Complaint  ?Patient presents with  ? Routine Post Op  ?    POV #2 DOS 03/24/2021 LT FOOT HAMMERTOE CORRECTION 2-5, METATARSAL OSTEOTOMY 2-5, GASTROCNEMIUS LENGTHENING  ? ? ? ?46 y.o. male returns for post-op check.  He is doing much better the pain is improving still having some pain in the back of the calf where the surgery site is, no chest pain or shortness of breath.  Has been resting much more. ? ?Review of Systems: Negative except as noted in the HPI. Denies N/V/F/Ch. ? ? ?Objective:  ?There were no vitals filed for this visit. ?There is no height or weight on file to calculate BMI. ?Constitutional Well developed. ?Well nourished.  ?Vascular Foot warm and well perfused. ?Capillary refill normal to all digits.  Calf is soft and supple, no posterior calf or knee pain, negative Homans' sign  ?Neurologic Normal speech. ?Oriented to person, place, and time. ?Epicritic sensation to light touch grossly present bilaterally.  ?Dermatologic Erythema edema and drainage has improved and mostly resolved.  No active signs of infection.  Small hemorrhagic bulla fourth toe left  ?Orthopedic: Edema has improved he has tenderness in the gastroc recession site and around the toes  ? ?Multiple view plain film radiographs: Status post hammertoe correction with K wire fixation still intact and metatarsal osteotomies ?Assessment:  ? ?1. Hammertoe of left foot   ?2. Gastrocnemius equinus of left lower extremity   ?3. Deformity of metatarsal bone of left foot   ? ? ?Plan:  ?Patient was evaluated and treated and all questions answered. ? ?S/p foot surgery left ?-Doing much better he should continue to rest as much as possible and ice and elevate ?-XR: New x-rays at week 6 when pin is removed ?-WB Status: WBAT in CAM boot.   ?-Medications: No refills required ?-Foot redressed.  May remove dressing on Monday and begin bathing, apply antibiotic  ointment to pin sites ? ?Return in about 1 week (around 04/13/2021) for post op (no x-rays), suture removal.  ?

## 2021-04-13 ENCOUNTER — Other Ambulatory Visit: Payer: Self-pay

## 2021-04-13 ENCOUNTER — Ambulatory Visit (INDEPENDENT_AMBULATORY_CARE_PROVIDER_SITE_OTHER): Payer: 59 | Admitting: Podiatry

## 2021-04-13 DIAGNOSIS — M2042 Other hammer toe(s) (acquired), left foot: Secondary | ICD-10-CM

## 2021-04-13 DIAGNOSIS — M24575 Contracture, left foot: Secondary | ICD-10-CM

## 2021-04-13 DIAGNOSIS — M21962 Unspecified acquired deformity of left lower leg: Secondary | ICD-10-CM

## 2021-04-13 DIAGNOSIS — M21862 Other specified acquired deformities of left lower leg: Secondary | ICD-10-CM

## 2021-04-17 NOTE — Progress Notes (Signed)
?  Subjective:  ?Patient ID: Carl Bruce, male    DOB: Mar 31, 1975,  MRN: [id number] ? ?Chief Complaint  ?Patient presents with  ? Routine Post Op  ?  Suture removal   ? ? ? ?46 y.o. male returns for post-op check.  Doing well he continues to rest and try to stay off the foot has been elevating and icing. ? ?Review of Systems: Negative except as noted in the HPI. Denies N/V/F/Ch. ? ? ?Objective:  ?There were no vitals filed for this visit. ?There is no height or weight on file to calculate BMI. ?Constitutional Well developed. ?Well nourished.  ?Vascular Foot warm and well perfused. ?Capillary refill normal to all digits.  Calf is soft and supple, no posterior calf or knee pain, negative Homans' sign  ?Neurologic Normal speech. ?Oriented to person, place, and time. ?Epicritic sensation to light touch grossly present bilaterally.  ?Dermatologic No new erythema edema hemorrhagic bulla has resolved, some skin breakdown here  ?Orthopedic: Edema has improved he has tenderness in the gastroc recession site and around the toes  ? ?Multiple view plain film radiographs: Status post hammertoe correction with K wire fixation still intact and metatarsal osteotomies ?Assessment:  ? ?1. Hammertoe of left foot   ?2. Gastrocnemius equinus of left lower extremity   ?3. Deformity of metatarsal bone of left foot   ?4. Joint contracture of foot, left   ? ? ? ?Plan:  ?Patient was evaluated and treated and all questions answered. ? ?S/p foot surgery left ?-Sutures removed today.  Continue WBAT in the cam boot.  Return at next visit for new radiographs and pin removal, may transition to shoes or boots at that point if he is doing well ? ? ?Return in about 3 weeks (around 05/04/2021) for post op (new x-rays), pin removal .  ?

## 2021-04-20 ENCOUNTER — Encounter: Payer: 59 | Admitting: Podiatry

## 2021-04-20 DIAGNOSIS — G4733 Obstructive sleep apnea (adult) (pediatric): Secondary | ICD-10-CM | POA: Diagnosis not present

## 2021-05-02 ENCOUNTER — Ambulatory Visit (INDEPENDENT_AMBULATORY_CARE_PROVIDER_SITE_OTHER): Payer: 59

## 2021-05-02 ENCOUNTER — Ambulatory Visit (INDEPENDENT_AMBULATORY_CARE_PROVIDER_SITE_OTHER): Payer: 59 | Admitting: Podiatry

## 2021-05-02 ENCOUNTER — Other Ambulatory Visit: Payer: Self-pay

## 2021-05-02 DIAGNOSIS — M2042 Other hammer toe(s) (acquired), left foot: Secondary | ICD-10-CM | POA: Diagnosis not present

## 2021-05-02 DIAGNOSIS — M21862 Other specified acquired deformities of left lower leg: Secondary | ICD-10-CM

## 2021-05-05 NOTE — Progress Notes (Signed)
?  Subjective:  ?Patient ID: Carl Bruce, male    DOB: [birth year],  MRN: [id number] ? ?Chief Complaint  ?Patient presents with  ? Routine Post Op  ?  (xray)pin removal POV #4 DOS 03/24/2021 LT FOOT HAMMERTOE CORRECTION 2-5, METATARSAL OSTEOTOMY 2-5, GASTROCNEMIUS LENGTHENING  ? ? ? ?46 y.o. male returns for post-op check.  Overall doing well.  Ready to get these pins out ? ?Review of Systems: Negative except as noted in the HPI. Denies N/V/F/Ch. ? ? ?Objective:  ?There were no vitals filed for this visit. ?There is no height or weight on file to calculate BMI. ?Constitutional Well developed. ?Well nourished.  ?Vascular Foot warm and well perfused. ?Capillary refill normal to all digits.  Calf is soft and supple, no posterior calf or knee pain, negative Homans' sign  ?Neurologic Normal speech. ?Oriented to person, place, and time. ?Epicritic sensation to light touch grossly present bilaterally.  ?Dermatologic Skin has healed completely now  ?Orthopedic: Minimal edema and pain now  ? ?Multiple view plain film radiographs: Status post hammertoe correction with K wire fixation still intact good alignment and increasing healing across the osteotomies and arthrodesis sites ?Assessment:  ? ?1. Hammertoe of left foot   ?2. Gastrocnemius equinus of left lower extremity   ? ? ? ?Plan:  ?Patient was evaluated and treated and all questions answered. ? ?S/p foot surgery left ?-Doing well I reviewed the radiographs with him and his wife today.  The Kirschner wires were removed without incident.  He may return to regular shoe gear gradually.  I will see him back in 6 weeks for new x-rays. ? ? ?Return in about 6 weeks (around 06/13/2021) for post op (new x-rays).  ?

## 2021-05-22 ENCOUNTER — Other Ambulatory Visit (HOSPITAL_COMMUNITY): Payer: Self-pay

## 2021-06-13 ENCOUNTER — Encounter: Payer: 59 | Admitting: Podiatry

## 2021-06-14 ENCOUNTER — Ambulatory Visit (INDEPENDENT_AMBULATORY_CARE_PROVIDER_SITE_OTHER): Payer: 59 | Admitting: Podiatry

## 2021-06-14 ENCOUNTER — Ambulatory Visit (INDEPENDENT_AMBULATORY_CARE_PROVIDER_SITE_OTHER): Payer: 59

## 2021-06-14 DIAGNOSIS — M2042 Other hammer toe(s) (acquired), left foot: Secondary | ICD-10-CM | POA: Diagnosis not present

## 2021-06-18 NOTE — Progress Notes (Signed)
?  Subjective:  ?Patient ID: Carl Bruce, male    DOB: [birth year],  MRN: [id number] ? ?Chief Complaint  ?Patient presents with  ? Hammer Toe  ?  6 weeks (around 06/13/2021) for post op (new x-rays).  ? ? ? ?46 y.o. male returns for post-op check.  Overall doing well.  Not having much pain he is back in regular shoe gear ? ?Review of Systems: Negative except as noted in the HPI. Denies N/V/F/Ch. ? ? ?Objective:  ?There were no vitals filed for this visit. ?There is no height or weight on file to calculate BMI. ?Constitutional Well developed. ?Well nourished.  ?Vascular Foot warm and well perfused. ?Capillary refill normal to all digits.  Calf is soft and supple, no posterior calf or knee pain, negative Homans' sign  ?Neurologic Normal speech. ?Oriented to person, place, and time. ?Epicritic sensation to light touch grossly present bilaterally.  ?Dermatologic Incisions well-healed nonhypertrophic  ?Orthopedic: He has no pain or swelling.  Good alignment of toes maintained.  He does still have some flexion at the DIPJ especially in the third  ? ?Multiple view plain film radiographs: Arthrodesis sites in the toes are fairly well-healed the osteotomy sites are healing well with bone callus formation ?Assessment:  ? ?1. Hammertoe of left foot   ? ? ? ?Plan:  ?Patient was evaluated and treated and all questions answered. ? ?S/p foot surgery left ?-Doing well I reviewed the radiographs with him and his wife today.  He may continue regular shoe gear and activity as tolerated.  I will see him back in 3 months for follow-up and final x-rays.  He does still have some flexion deformity in the toe at the DIPJ especially the third and fourth.  Could consider flexor tenotomy in office which I discussed with him.  We will reevaluate at next visit. ? ? ?Return in about 3 months (around 09/14/2021) for left foot surgery follow up and xrays.  ?

## 2021-06-27 ENCOUNTER — Other Ambulatory Visit (HOSPITAL_COMMUNITY): Payer: Self-pay

## 2021-07-10 ENCOUNTER — Ambulatory Visit: Payer: 59 | Admitting: Family Medicine

## 2021-07-10 ENCOUNTER — Encounter: Payer: Self-pay | Admitting: Family Medicine
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
## 2021-07-10 NOTE — Patient Instructions (Incomplete)
Below is our plan: ° °We will *** ° °Please make sure you are consistent with timing of seizure medication. I recommend annual visit with primary care provider (PCP) for complete physical and routine blood work. We will monitor vitamin D level. I recommend daily intake of vitamin D (400-800iu) and calcium (800-1000mg) for bone health. Discuss Dexa screening with PCP.  ° °According to  law, you can not drive unless you are seizure / syncope free for at least 6 months and under physician's care. ° °Please maintain precautions. Do not participate in activities where a loss of awareness could harm you or someone else. No swimming alone, no tub bathing, no hot tubs, no driving, no operating motorized vehicles (cars, ATVs, motocycles, etc), lawnmowers, power tools or firearms. No standing at heights, such as rooftops, ladders or stairs. Avoid hot objects such as stoves, heaters, open fires. Wear a helmet when riding a bicycle, scooter, skateboard, etc. and avoid areas of traffic. Set your water heater to 120 degrees or less.  ° ° °Please make sure you are staying well hydrated. I recommend 50-60 ounces daily. Well balanced diet and regular exercise encouraged. Consistent sleep schedule with 6-8 hours recommended.  ° °Please continue follow up with care team as directed.  ° °Follow up with *** in *** ° °You may receive a survey regarding today's visit. I encourage you to leave honest feed back as I do use this information to improve patient care. Thank you for seeing me today!  ° ° °

## 2021-07-24 ENCOUNTER — Encounter: Payer: Self-pay | Admitting: Podiatry

## 2021-07-24 DIAGNOSIS — Z0289 Encounter for other administrative examinations: Secondary | ICD-10-CM

## 2021-07-25 MED ORDER — CIPROFLOXACIN HCL 500 MG PO TABS: 500.0000 mg | ORAL_TABLET | Freq: Two times a day (BID) | ORAL | 0 refills | Status: DC

## 2021-07-31 ENCOUNTER — Other Ambulatory Visit (HOSPITAL_COMMUNITY): Payer: Self-pay

## 2021-07-31 ENCOUNTER — Ambulatory Visit (INDEPENDENT_AMBULATORY_CARE_PROVIDER_SITE_OTHER): Payer: 59 | Admitting: Nurse Practitioner

## 2021-07-31 ENCOUNTER — Encounter: Payer: Self-pay | Admitting: Nurse Practitioner

## 2021-07-31 VITALS — BP 120/73 | HR 59 | Temp 96.6°F | Resp 20 | Ht >= 80 in | Wt 301.0 lb

## 2021-07-31 DIAGNOSIS — R0602 Shortness of breath: Secondary | ICD-10-CM

## 2021-07-31 DIAGNOSIS — Q245 Malformation of coronary vessels: Secondary | ICD-10-CM | POA: Diagnosis not present

## 2021-07-31 DIAGNOSIS — R079 Chest pain, unspecified: Secondary | ICD-10-CM

## 2021-07-31 DIAGNOSIS — Q87410 Marfan's syndrome with aortic dilation: Secondary | ICD-10-CM | POA: Diagnosis not present

## 2021-07-31 DIAGNOSIS — E782 Mixed hyperlipidemia: Secondary | ICD-10-CM

## 2021-07-31 DIAGNOSIS — I10 Essential (primary) hypertension: Secondary | ICD-10-CM

## 2021-07-31 DIAGNOSIS — F411 Generalized anxiety disorder: Secondary | ICD-10-CM

## 2021-07-31 DIAGNOSIS — Z95828 Presence of other vascular implants and grafts: Secondary | ICD-10-CM | POA: Diagnosis not present

## 2021-07-31 MED ORDER — CITALOPRAM HYDROBROMIDE 20 MG PO TABS
ORAL_TABLET | Freq: Two times a day (BID) | ORAL | 3 refills | Status: DC
  Filled 2021-07-31: qty 180, fill #0
  Filled 2021-09-17: qty 180, 90d supply, fill #0
  Filled 2021-12-17: qty 180, 90d supply, fill #1

## 2021-07-31 MED ORDER — LOSARTAN POTASSIUM 25 MG PO TABS
ORAL_TABLET | Freq: Two times a day (BID) | ORAL | 3 refills | Status: DC
  Filled 2021-07-31: qty 180, 90d supply, fill #0

## 2021-07-31 MED ORDER — METOPROLOL TARTRATE 100 MG PO TABS
ORAL_TABLET | Freq: Two times a day (BID) | ORAL | 3 refills | Status: DC
  Filled 2021-07-31: qty 180, 90d supply, fill #0

## 2021-07-31 MED ORDER — ROSUVASTATIN CALCIUM 20 MG PO TABS
20.0000 mg | ORAL_TABLET | Freq: Every day | ORAL | 1 refills | Status: DC
  Filled 2021-07-31 – 2021-09-17 (×2): qty 90, 90d supply, fill #0
  Filled 2021-12-17: qty 90, 90d supply, fill #1
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
## 2021-08-01 ENCOUNTER — Ambulatory Visit: Payer: 59 | Admitting: Podiatry

## 2021-08-01 DIAGNOSIS — L97522 Non-pressure chronic ulcer of other part of left foot with fat layer exposed: Secondary | ICD-10-CM

## 2021-08-01 DIAGNOSIS — M24575 Contracture, left foot: Secondary | ICD-10-CM

## 2021-08-01 DIAGNOSIS — M2042 Other hammer toe(s) (acquired), left foot: Secondary | ICD-10-CM | POA: Diagnosis not present

## 2021-08-01 LAB — CMP14+EGFR
ALT: 11 IU/L (ref 0–44)
AST: 13 IU/L (ref 0–40)
Albumin/Globulin Ratio: 1.9 (ref 1.2–2.2)
Albumin: 5 g/dL (ref 4.0–5.0)
Alkaline Phosphatase: 101 IU/L (ref 44–121)
BUN/Creatinine Ratio: 15 (ref 9–20)
BUN: 16 mg/dL (ref 6–24)
Bilirubin Total: 0.4 mg/dL (ref 0.0–1.2)
CO2: 25 mmol/L (ref 20–29)
Calcium: 9.6 mg/dL (ref 8.7–10.2)
Chloride: 98 mmol/L (ref 96–106)
Creatinine, Ser: 1.08 mg/dL (ref 0.76–1.27)
Globulin, Total: 2.6 g/dL (ref 1.5–4.5)
Glucose: 83 mg/dL (ref 70–99)
Potassium: 4.8 mmol/L (ref 3.5–5.2)
Sodium: 137 mmol/L (ref 134–144)
Total Protein: 7.6 g/dL (ref 6.0–8.5)
eGFR: 86 mL/min/{1.73_m2} (ref >59)

## 2021-08-01 LAB — CBC WITH DIFFERENTIAL/PLATELET
Basophils Absolute: 0.1 10*3/uL (ref 0.0–0.2)
Basos: 1 %
EOS (ABSOLUTE): 0.6 10*3/uL — ABNORMAL HIGH (ref 0.0–0.4)
Eos: 9 %
Hematocrit: 45.7 % (ref 37.5–51.0)
Hemoglobin: 14.8 g/dL (ref 13.0–17.7)
Immature Grans (Abs): 0 10*3/uL (ref 0.0–0.1)
Immature Granulocytes: 0 %
Lymphocytes Absolute: 2.6 10*3/uL (ref 0.7–3.1)
Lymphs: 36 %
MCH: 26.9 pg (ref 26.6–33.0)
MCHC: 32.4 g/dL (ref 31.5–35.7)
MCV: 83 fL (ref 79–97)
Monocytes Absolute: 0.7 10*3/uL (ref 0.1–0.9)
Monocytes: 10 %
Neutrophils Absolute: 3.2 10*3/uL (ref 1.4–7.0)
Neutrophils: 44 %
Platelets: 281 10*3/uL (ref 150–450)
RBC: 5.51 x10E6/uL (ref 4.14–5.80)
RDW: 13.3 % (ref 11.6–15.4)
WBC: 7.2 10*3/uL (ref 3.4–10.8)

## 2021-08-01 LAB — LIPID PANEL
Chol/HDL Ratio: 3.3 ratio (ref 0.0–5.0)
Cholesterol, Total: 127 mg/dL (ref 100–199)
HDL: 38 mg/dL — ABNORMAL LOW (ref >39)
LDL Chol Calc (NIH): 72 mg/dL (ref 0–99)
Triglycerides: 88 mg/dL (ref 0–149)
VLDL Cholesterol Cal: 17 mg/dL (ref 5–40)

## 2021-08-01 LAB — VITAMIN D 25 HYDROXY (VIT D DEFICIENCY, FRACTURES): Vit D, 25-Hydroxy: 44.7 ng/mL (ref 30.0–100.0)

## 2021-08-01 LAB — VITAMIN B12: Vitamin B-12: 446 pg/mL (ref 232–1245)

## 2021-08-01 LAB — PSA, TOTAL AND FREE
PSA, Free Pct: 24 %
PSA, Free: 0.12 ng/mL
Prostate Specific Ag, Serum: 0.5 ng/mL (ref 0.0–4.0)

## 2021-08-01 LAB — BAYER DCA HB A1C WAIVED: HB A1C (BAYER DCA - WAIVED): 5.1 % (ref 4.8–5.6)

## 2021-08-01 MED ORDER — DOXYCYCLINE HYCLATE 100 MG PO TABS: 100.0000 mg | ORAL_TABLET | Freq: Two times a day (BID) | ORAL | 0 refills | Status: DC

## 2021-08-05 NOTE — Progress Notes (Signed)
  Subjective:  Patient ID: Carl Bruce, male    DOB: 07-15-1975,  MRN: [id number]  Chief Complaint  Patient presents with   Foot Ulcer       R toe ulcer Follow-up     46 y.o. male returns for post-op check.  Overall doing okay he is doing better now that he is been on the doxycycline, the sores on the tips of the toes have recurred some the third toe is contracted significantly  Review of Systems: Negative except as noted in the HPI. Denies N/V/F/Ch.   Objective:  There were no vitals filed for this visit. There is no height or weight on file to calculate BMI. Constitutional Well developed. Well nourished.  Vascular Foot warm and well perfused. Capillary refill normal to all digits.  Calf is soft and supple, no posterior calf or knee pain, negative Homans' sign  Neurologic Normal speech. Oriented to person, place, and time. Epicritic sensation to light touch grossly present bilaterally.  Dermatologic Scab present on top of amputation site of second toe, distal tip ulcer of the third toe  Orthopedic: He has no pain or swelling.  Good alignment of toes maintained.  He does still have some flexion at the DIPJ especially in the third   Multiple view plain film radiographs: Arthrodesis sites in the toes are fairly well-healed the osteotomy sites are healing well with bone callus formation Assessment:   1. Hammertoe of left foot   2. Skin ulcer of toe of left foot with fat layer exposed (HCC)   3. Joint contracture of foot, left      Plan:  Patient was evaluated and treated and all questions answered.  S/p foot surgery left -Ulcerations are still present.  I recommend he continue local wound care with mupirocin ointment which she has at home.  I placed him on a second round of doxycycline and once the ulcerations can heal further I think we should proceed with flexor tenotomy of the third toe can consider others as well to reduce the risk of reulceration.  Overall alignment is  greatly improved from baseline his function and pain has reduced significantly, would like to improve further alignment of the toes to prevent recurrent ulcerations and infection.   Return in about 2 weeks (around 08/15/2021) for tenotomy of left third toe.

## 2021-08-07 DIAGNOSIS — I10 Essential (primary) hypertension: Secondary | ICD-10-CM | POA: Diagnosis not present

## 2021-08-09 LAB — URINALYSIS, COMPLETE
Bilirubin, UA: NEGATIVE
Glucose, UA: NEGATIVE
Ketones, UA: NEGATIVE
Leukocytes,UA: NEGATIVE
Nitrite, UA: NEGATIVE
Protein,UA: NEGATIVE
RBC, UA: NEGATIVE
Specific Gravity, UA: 1.015 (ref 1.005–1.030)
Urobilinogen, Ur: 0.2 mg/dL (ref 0.2–1.0)
pH, UA: 5.5 (ref 5.0–7.5)

## 2021-08-10 LAB — URINE CULTURE: Organism ID, Bacteria: NO GROWTH

## 2021-08-21 ENCOUNTER — Other Ambulatory Visit (HOSPITAL_COMMUNITY): Payer: Self-pay

## 2021-08-21 DIAGNOSIS — M545 Low back pain, unspecified: Secondary | ICD-10-CM | POA: Diagnosis not present

## 2021-08-21 DIAGNOSIS — M25551 Pain in right hip: Secondary | ICD-10-CM | POA: Diagnosis not present

## 2021-08-23 ENCOUNTER — Ambulatory Visit: Payer: 59 | Admitting: Podiatry

## 2021-08-23 DIAGNOSIS — M24575 Contracture, left foot: Secondary | ICD-10-CM | POA: Diagnosis not present

## 2021-08-23 NOTE — Patient Instructions (Signed)
Leave the bandage on until Saturday. After that you may remove and shower. Cover with neosporin and a bandaid

## 2021-08-28 NOTE — Progress Notes (Signed)
He presents today for follow-up on his left third contracted toe.  He has completed the doxycycline course.  Feels well denies fevers chills nausea vomiting.  No drainage from the tip of the toe.  Would like to proceed with flexor tenotomy today.  All risks and benefits were discussed prior to the procedure.  He understands and wishes to proceed.  Following a digital block with 1.5 cc each of 2% lidocaine and 0.5% Marcaine plain, the toe was prepped in an aseptic manner with Betadine and a tourniquet applied.  An 18-gauge needle and 11 blade was used to transect the long flexor tendon at the level of the middle phalanx.  This reduced much of the contracture.  It was splinted with a sterile bandage.  Post care instructions were given.  I will see him back in 1 month for follow-up

## 2021-09-18 ENCOUNTER — Other Ambulatory Visit (HOSPITAL_COMMUNITY): Payer: Self-pay

## 2021-09-23 ENCOUNTER — Encounter: Payer: Self-pay | Admitting: Cardiology
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
## 2021-09-25 ENCOUNTER — Encounter: Payer: Self-pay | Admitting: Cardiology

## 2021-09-25 ENCOUNTER — Ambulatory Visit: Payer: 59 | Admitting: Cardiology

## 2021-09-25 VITALS — BP 125/72 | HR 49 | Ht >= 80 in | Wt 287.0 lb

## 2021-09-25 DIAGNOSIS — Q8740 Marfan's syndrome, unspecified: Secondary | ICD-10-CM | POA: Diagnosis not present

## 2021-09-25 DIAGNOSIS — Z9889 Other specified postprocedural states: Secondary | ICD-10-CM | POA: Diagnosis not present

## 2021-09-25 DIAGNOSIS — I10 Essential (primary) hypertension: Secondary | ICD-10-CM

## 2021-09-25 DIAGNOSIS — E785 Hyperlipidemia, unspecified: Secondary | ICD-10-CM | POA: Diagnosis not present

## 2021-09-25 NOTE — Patient Instructions (Signed)
  Testing/Procedures:  CTA OF THE CHEST TO FOLLOW UP AORTIC ANEURYSM REPAIR AT THE [facility]   Follow-Up: At [facility], you and your health needs are our priority.  As part of our continuing mission to provide you with exceptional heart care, we have created designated Provider Care Teams.  These Care Teams include your primary Cardiologist (physician) and Advanced Practice Providers (APPs -  Physician Assistants and Nurse Practitioners) who all work together to provide you with the care you need, when you need it.  We recommend signing up for the patient portal called "MyChart".  Sign up information is provided on this After Visit Summary.  MyChart is used to connect with patients for Virtual Visits (Telemedicine).  Patients are able to view lab/test results, encounter notes, upcoming appointments, etc.  Non-urgent messages can be sent to your provider as well.   To learn more about what you can do with MyChart, go to [email or web address].    Your next appointment:   6 month(s)  The format for your next appointment:   In Person  Provider:   Rollene Rotunda MS IN [place]

## 2021-09-27 ENCOUNTER — Ambulatory Visit: Payer: 59 | Admitting: Podiatry

## 2021-09-27 ENCOUNTER — Encounter: Payer: Self-pay | Admitting: Podiatry

## 2021-09-27 DIAGNOSIS — S90851A Superficial foreign body, right foot, initial encounter: Secondary | ICD-10-CM | POA: Diagnosis not present

## 2021-09-27 DIAGNOSIS — S91331A Puncture wound without foreign body, right foot, initial encounter: Secondary | ICD-10-CM

## 2021-09-27 DIAGNOSIS — M24575 Contracture, left foot: Secondary | ICD-10-CM

## 2021-10-02 NOTE — Progress Notes (Signed)
  Subjective:  Patient ID: Carl Bruce, male    DOB: [birth year],  MRN: [id number]  Chief Complaint  Patient presents with   Procedure    Tenotomy follow up 3rd toe left   "Its fine, no problems"   Foot Pain    Plantar (arch) right - small, callused lesion x 2 weeks, slight redness, thinks insole might have rubbed a place    46 y.o. male presents with the above complaint. History confirmed with patient.  He presents today for follow-up of his left third flexor tenotomy.  Is doing well is not having any pain.  He is pleased with the result.  He notes a new issue on the right foot today says there is a painful hard callus he cannot get to  Objective:  Physical Exam: warm, good capillary refill, no trophic changes or ulcerative lesions, normal DP and PT pulses, normal sensory exam, and left third toe flexor tenotomy site healed well, significant improvement in position of the toe, on the right foot there is a plantar midfoot hyperkeratotic lesion that is erythematous and very tender to touch..   Assessment:   1. Puncture wound of right foot, initial encounter   2. Foreign body in right foot, initial encounter   3. Joint contracture of foot, left      Plan:  Patient was evaluated and treated and all questions answered.  His left third toe tenotomy is doing well and is asymptomatic.  May continue regular activity and shoe gear.  He would like to wait some time before addressing his right foot deformities as well   Regarding his right foot and the new issue of appear to be puncture wound and foreign body on the right foot.  I began by debriding the overlying hyperkeratosis of what I thought was a porokeratosis.  Once it was) a foreign body I prepped the area with Betadine and lidocaine cream.  The area was then anesthetized in a field block with lidocaine and Marcaine with epinephrine.  Once was completed it was reprepped with Betadine.  Sharp scalpel was used to incise the area of the  puncture wound and forceps were used to extract a small metallic wire filament.  This alleviated his pain and pressure.  There is no gross purulence.  It did not extend beyond the subcutaneous layer.  It was dressed with a bandage and post care instructions were given.  Return as needed if this does not improve or worsens  No follow-ups on file.

## 2021-10-13 ENCOUNTER — Encounter (HOSPITAL_COMMUNITY): Payer: Self-pay

## 2021-10-13 ENCOUNTER — Ambulatory Visit (HOSPITAL_COMMUNITY)
Admission: RE | Admit: 2021-10-13 | Discharge: 2021-10-13 | Disposition: A | Discharge status: Home / Self Care | Payer: 59 | Source: Ambulatory Visit | Attending: Cardiology | Admitting: Cardiology

## 2021-10-13 DIAGNOSIS — Q8740 Marfan's syndrome, unspecified: Secondary | ICD-10-CM | POA: Diagnosis not present

## 2021-10-13 DIAGNOSIS — Z9889 Other specified postprocedural states: Secondary | ICD-10-CM | POA: Insufficient documentation

## 2021-10-13 DIAGNOSIS — Z952 Presence of prosthetic heart valve: Secondary | ICD-10-CM | POA: Diagnosis not present

## 2021-10-13 DIAGNOSIS — I719 Aortic aneurysm of unspecified site, without rupture: Secondary | ICD-10-CM | POA: Diagnosis not present

## 2021-10-13 MED ORDER — IOHEXOL 350 MG/ML SOLN
100.0000 mL | Freq: Once | INTRAVENOUS | Status: AC | PRN
  Administered 2021-10-13: 100 mL via INTRAVENOUS
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
## 2021-11-07 NOTE — Patient Instructions (Signed)
Below is our plan:  We will continue levetiracetam 500mg  twice daily   Please make sure you are consistent with timing of seizure medication. I recommend annual visit with primary care provider (PCP) for complete physical and routine blood work. I recommend daily intake of vitamin D (400-800iu) and calcium (800-1000mg ) for bone health. Discuss Dexa screening with PCP.   According to [place] law, you can not drive unless you are seizure / syncope free for at least 6 months and under physician's care.  Please maintain precautions. Do not participate in activities where a loss of awareness could harm you or someone else. No swimming alone, no tub bathing, no hot tubs, no driving, no operating motorized vehicles (cars, ATVs, motocycles, etc), lawnmowers, power tools or firearms. No standing at heights, such as rooftops, ladders or stairs. Avoid hot objects such as stoves, heaters, open fires. Wear a helmet when riding a bicycle, scooter, skateboard, etc. and avoid areas of traffic. Set your water heater to 120 degrees or less.  Please make sure you are staying well hydrated. I recommend 50-60 ounces daily. Well balanced diet and regular exercise encouraged. Consistent sleep schedule with 6-8 hours recommended.   Please continue follow up with care team as directed.   Follow up with me in 6 months   You may receive a survey regarding today's visit. I encourage you to leave honest feed back as I do use this information to improve patient care. Thank you for seeing me today!

## 2021-11-08 ENCOUNTER — Encounter: Payer: Self-pay | Admitting: Family Medicine

## 2021-11-08 ENCOUNTER — Ambulatory Visit: Payer: 59 | Admitting: Family Medicine

## 2021-11-08 VITALS — BP 114/66 | HR 53 | Ht >= 80 in | Wt 281.0 lb

## 2021-11-08 DIAGNOSIS — G40909 Epilepsy, unspecified, not intractable, without status epilepticus: Secondary | ICD-10-CM | POA: Diagnosis not present

## 2021-11-08 DIAGNOSIS — F32A Depression, unspecified: Secondary | ICD-10-CM

## 2021-11-08 DIAGNOSIS — Z87828 Personal history of other (healed) physical injury and trauma: Secondary | ICD-10-CM

## 2021-11-12 DIAGNOSIS — Z720 Tobacco use: Secondary | ICD-10-CM | POA: Diagnosis not present

## 2021-11-12 DIAGNOSIS — L511 Stevens-Johnson syndrome: Secondary | ICD-10-CM | POA: Diagnosis not present

## 2021-11-12 DIAGNOSIS — R4182 Altered mental status, unspecified: Secondary | ICD-10-CM | POA: Diagnosis not present

## 2021-11-12 DIAGNOSIS — R410 Disorientation, unspecified: Secondary | ICD-10-CM | POA: Diagnosis not present

## 2021-11-12 DIAGNOSIS — I10 Essential (primary) hypertension: Secondary | ICD-10-CM | POA: Diagnosis not present

## 2021-11-12 DIAGNOSIS — G4730 Sleep apnea, unspecified: Secondary | ICD-10-CM | POA: Diagnosis not present

## 2021-11-12 DIAGNOSIS — Q8740 Marfan's syndrome, unspecified: Secondary | ICD-10-CM | POA: Diagnosis not present

## 2021-11-14 ENCOUNTER — Encounter: Payer: Self-pay | Admitting: Family Medicine

## 2021-11-14 ENCOUNTER — Ambulatory Visit (HOSPITAL_COMMUNITY)
Admission: EM | Admit: 2021-11-14 | Discharge: 2021-11-14 | Disposition: A | Discharge status: Home / Self Care | Payer: 59

## 2021-11-14 DIAGNOSIS — F321 Major depressive disorder, single episode, moderate: Secondary | ICD-10-CM | POA: Diagnosis not present

## 2021-11-14 NOTE — ED Notes (Signed)
Patient discharge by Thomes Lolling, NP with written and verbal instructions.
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
## 2021-11-14 NOTE — Discharge Instructions (Addendum)
Outpatient psychiatry and therapy is recommended.  Given your remote location, the following will provide services virtually:  [address] [place], [place] [address] Phone: [phone]  [facility] [place], [place] [address] Phone: [phone]  [facility] [address] [place], [place] [address] Phone: [phone]  **Additional options can be found on [email or web address]  - you can search for providers by location and specialty

## 2021-11-27 ENCOUNTER — Other Ambulatory Visit (HOSPITAL_COMMUNITY): Payer: Self-pay

## 2021-11-28 ENCOUNTER — Encounter (HOSPITAL_COMMUNITY): Payer: 59 | Admitting: Psychiatry

## 2021-11-28 ENCOUNTER — Encounter (HOSPITAL_COMMUNITY): Payer: Self-pay

## 2021-11-28 DIAGNOSIS — F329 Major depressive disorder, single episode, unspecified: Secondary | ICD-10-CM | POA: Insufficient documentation

## 2021-11-28 NOTE — Progress Notes (Signed)
This encounter was created in error - please disregard.

## 2021-12-18 ENCOUNTER — Other Ambulatory Visit (HOSPITAL_COMMUNITY): Payer: Self-pay

## 2021-12-20 ENCOUNTER — Other Ambulatory Visit (HOSPITAL_COMMUNITY): Payer: Self-pay

## 2021-12-26 ENCOUNTER — Other Ambulatory Visit: Payer: Self-pay | Admitting: Family Medicine

## 2021-12-26 ENCOUNTER — Other Ambulatory Visit: Payer: 59

## 2021-12-26 DIAGNOSIS — R3915 Urgency of urination: Secondary | ICD-10-CM | POA: Diagnosis not present

## 2021-12-26 LAB — URINALYSIS, COMPLETE
Bilirubin, UA: NEGATIVE
Glucose, UA: NEGATIVE
Ketones, UA: NEGATIVE
Leukocytes,UA: NEGATIVE
Nitrite, UA: NEGATIVE
Protein,UA: NEGATIVE
RBC, UA: NEGATIVE
Specific Gravity, UA: 1.025 (ref 1.005–1.030)
Urobilinogen, Ur: 0.2 mg/dL (ref 0.2–1.0)
pH, UA: 6 (ref 5.0–7.5)

## 2021-12-26 LAB — MICROSCOPIC EXAMINATION
Bacteria, UA: NONE SEEN
Epithelial Cells (non renal): NONE SEEN /hpf (ref 0–10)
RBC, Urine: NONE SEEN /hpf (ref 0–2)
Renal Epithel, UA: NONE SEEN /hpf
WBC, UA: NONE SEEN /hpf (ref 0–5)

## 2021-12-28 LAB — URINE CULTURE: Organism ID, Bacteria: NO GROWTH

## 2022-01-02 ENCOUNTER — Ambulatory Visit: Payer: 59 | Admitting: Urology

## 2022-01-03 DIAGNOSIS — G4733 Obstructive sleep apnea (adult) (pediatric): Secondary | ICD-10-CM | POA: Diagnosis not present

## 2022-01-09 ENCOUNTER — Encounter: Payer: Self-pay | Admitting: Urology

## 2022-01-09 ENCOUNTER — Ambulatory Visit (INDEPENDENT_AMBULATORY_CARE_PROVIDER_SITE_OTHER): Payer: 59 | Admitting: Urology

## 2022-01-09 VITALS — BP 125/75 | HR 63

## 2022-01-09 DIAGNOSIS — R3915 Urgency of urination: Secondary | ICD-10-CM | POA: Diagnosis not present

## 2022-01-09 DIAGNOSIS — N3281 Overactive bladder: Secondary | ICD-10-CM

## 2022-01-09 DIAGNOSIS — R361 Hematospermia: Secondary | ICD-10-CM

## 2022-01-09 DIAGNOSIS — R350 Frequency of micturition: Secondary | ICD-10-CM | POA: Diagnosis not present
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
## 2022-01-10 LAB — URINALYSIS, ROUTINE W REFLEX MICROSCOPIC
Bilirubin, UA: NEGATIVE
Glucose, UA: NEGATIVE
Ketones, UA: NEGATIVE
Leukocytes,UA: NEGATIVE
Nitrite, UA: NEGATIVE
Protein,UA: NEGATIVE
RBC, UA: NEGATIVE
Specific Gravity, UA: 1.025 (ref 1.005–1.030)
Urobilinogen, Ur: 0.2 mg/dL (ref 0.2–1.0)
pH, UA: 6 (ref 5.0–7.5)

## 2022-01-12 DIAGNOSIS — G4733 Obstructive sleep apnea (adult) (pediatric): Secondary | ICD-10-CM | POA: Diagnosis not present

## 2022-01-22 DIAGNOSIS — F331 Major depressive disorder, recurrent, moderate: Secondary | ICD-10-CM | POA: Diagnosis not present

## 2022-01-26 ENCOUNTER — Other Ambulatory Visit: Payer: Self-pay | Admitting: Nurse Practitioner

## 2022-01-26 MED ORDER — VIIBRYD STARTER PACK 10 & 20 MG PO KIT: PACK | ORAL | 0 refills | Status: DC

## 2022-01-30 ENCOUNTER — Ambulatory Visit: Payer: 59 | Admitting: Nurse Practitioner

## 2022-01-30 ENCOUNTER — Encounter: Payer: Self-pay | Admitting: Nurse Practitioner

## 2022-01-30 VITALS — BP 130/71 | HR 66 | Temp 97.1°F | Resp 20 | Ht >= 80 in | Wt 289.0 lb

## 2022-01-30 DIAGNOSIS — R531 Weakness: Secondary | ICD-10-CM

## 2022-01-30 DIAGNOSIS — R062 Wheezing: Secondary | ICD-10-CM | POA: Diagnosis not present

## 2022-01-30 MED ORDER — PREDNISONE 10 MG (21) PO TBPK: ORAL_TABLET | ORAL | 0 refills | Status: DC

## 2022-01-30 NOTE — Progress Notes (Signed)
   Subjective:    Patient ID: Carl Bruce, male    DOB: April 22, 1975, 46 y.o.   MRN: [id number]   Chief Complaint: Fatigue   HPI Patient comes in c/o of feeling cold all the time, decrease appetite and fatigue. His last check up his labs were al normal. He says this started 2 weeks ago. Has been in bed all weekend and just feels bad. Fatigue really set in the last 2 days.     Review of Systems  Constitutional:  Positive for appetite change (decreased) and fatigue. Negative for fever.  HENT: Negative.    Respiratory:  Positive for shortness of breath (slight) and wheezing.        Objective:   Physical Exam Vitals reviewed.  Constitutional:      Appearance: Normal appearance.  HENT:     Nose: No congestion or rhinorrhea.     Mouth/Throat:     Pharynx: No oropharyngeal exudate or posterior oropharyngeal erythema.  Cardiovascular:     Rate and Rhythm: Normal rate and regular rhythm.     Heart sounds: Normal heart sounds.  Pulmonary:     Effort: Pulmonary effort is normal.     Breath sounds: Normal breath sounds.  Skin:    General: Skin is warm.  Neurological:     General: No focal deficit present.     Mental Status: He is alert and oriented to person, place, and time.  Psychiatric:        Mood and Affect: Mood normal.        Behavior: Behavior normal.    BP 130/71   Pulse 66   Temp (!) 97.1 F (36.2 C) (Temporal)   Resp 20   Ht 7' (2.134 m)   Wt 289 lb (131.1 kg)   BMI 28.80 kg/m   EKG- NSR-unchanged      Assessment & Plan:  [name] in today with chief complaint of Fatigue   1. Weak - EKG 12-Lead - Novel Coronavirus, NAA ([facility]) - Anemia Profile B - Thyroid Panel With TSH - CMP14+EGFR  2. Wheezing Meds ordered this encounter  Medications   predniSONE (STERAPRED UNI-PAK 21 TAB) 10 MG (21) TBPK tablet    Sig: As directed x 6 days    Dispense:  21 tablet    Refill:  0    Order Specific Question:   Supervising Provider    Answer:    Caryl Pina A [[id number]]   Covid test pending    The above assessment and management plan was discussed with the patient. The patient verbalized understanding of and has agreed to the management plan. Patient is aware to call the clinic if symptoms persist or worsen. Patient is aware when to return to the clinic for a follow-up visit. Patient educated on when it is appropriate to go to the emergency department.   Mary-Margaret Hassell Done, FNP

## 2022-01-30 NOTE — Patient Instructions (Signed)
Fatigue If you have fatigue, you feel tired all the time and have a lack of energy or a lack of motivation. Fatigue may make it difficult to start or complete tasks because of exhaustion. Occasional or mild fatigue is often a normal response to activity or life. However, long-term (chronic) or extreme fatigue may be a symptom of a medical condition such as: Depression. Not having enough red blood cells or hemoglobin in the blood (anemia). A problem with a small gland located in the lower front part of the neck (thyroid disorder). Rheumatologic conditions. These are problems related to the body's defense system (immune system). Infections, especially certain viral infections. Fatigue can also lead to negative health outcomes over time. Follow these instructions at home: Medicines Take over-the-counter and prescription medicines only as told by your health care provider. Take a multivitamin if told by your health care provider. Do not use herbal or dietary supplements unless they are approved by your health care provider. Eating and drinking  Avoid heavy meals in the evening. Eat a well-balanced diet, which includes lean proteins, whole grains, plenty of fruits and vegetables, and low-fat dairy products. Avoid eating or drinking too many products with caffeine in them. Avoid alcohol. Drink enough fluid to keep your urine pale yellow. Activity  Exercise regularly, as told by your health care provider. Use or practice techniques to help you relax, such as yoga, tai chi, meditation, or massage therapy. Lifestyle Change situations that cause you stress. Try to keep your work and personal schedules in balance. Do not use recreational or illegal drugs. General instructions Monitor your fatigue for any changes. Go to bed and get up at the same time every day. Avoid fatigue by pacing yourself during the day and getting enough sleep at night. Maintain a healthy weight. Contact a health care  provider if: Your fatigue does not get better. You have a fever. You suddenly lose or gain weight. You have headaches. You have trouble falling asleep or sleeping through the night. You feel angry, guilty, anxious, or sad. You have swelling in your legs or another part of your body. Get help right away if: You feel confused, feel like you might faint, or faint. Your vision is blurry or you have a severe headache. You have severe pain in your abdomen, your back, or the area between your waist and hips (pelvis). You have chest pain, shortness of breath, or an irregular or fast heartbeat. You are unable to urinate, or you urinate less than normal. You have abnormal bleeding from the rectum, nose, lungs, nipples, or, if you are male, the vagina. You vomit blood. You have thoughts about hurting yourself or others. These symptoms may be an emergency. Get help right away. Call 911. Do not wait to see if the symptoms will go away. Do not drive yourself to the hospital. Get help right away if you feel like you may hurt yourself or others, or have thoughts about taking your own life. Go to your nearest emergency room or: Call 911. Call the [facility] at [phone] or 988. This is open 24 hours a day. Text the Crisis Text Line at 979-823-0385. Summary If you have fatigue, you feel tired all the time and have a lack of energy or a lack of motivation. Fatigue may make it difficult to start or complete tasks because of exhaustion. Long-term (chronic) or extreme fatigue may be a symptom of a medical condition. Exercise regularly, as told by your health care provider.  Change situations that cause you stress. Try to keep your work and personal schedules in balance. This information is not intended to replace advice given to you by your health care provider. Make sure you discuss any questions you have with your health care provider. Document Revised: 11/14/2020 Document  Reviewed: 11/14/2020 Elsevier Patient Education  2023 Elsevier Inc.  

## 2022-01-31 LAB — ANEMIA PROFILE B
Basophils Absolute: 0 10*3/uL (ref 0.0–0.2)
Basos: 1 %
EOS (ABSOLUTE): 0 10*3/uL (ref 0.0–0.4)
Eos: 1 %
Ferritin: 67 ng/mL (ref 30–400)
Folate: 16.9 ng/mL (ref >3.0)
Hematocrit: 44 % (ref 37.5–51.0)
Hemoglobin: 14.7 g/dL (ref 13.0–17.7)
Immature Grans (Abs): 0 10*3/uL (ref 0.0–0.1)
Immature Granulocytes: 0 %
Iron Saturation: 10 % — ABNORMAL LOW (ref 15–55)
Iron: 32 ug/dL — ABNORMAL LOW (ref 38–169)
Lymphocytes Absolute: 1 10*3/uL (ref 0.7–3.1)
Lymphs: 27 %
MCH: 27.3 pg (ref 26.6–33.0)
MCHC: 33.4 g/dL (ref 31.5–35.7)
MCV: 82 fL (ref 79–97)
Monocytes Absolute: 0.5 10*3/uL (ref 0.1–0.9)
Monocytes: 14 %
Neutrophils Absolute: 2.1 10*3/uL (ref 1.4–7.0)
Neutrophils: 57 %
Platelets: 160 10*3/uL (ref 150–450)
RBC: 5.39 x10E6/uL (ref 4.14–5.80)
RDW: 13 % (ref 11.6–15.4)
Retic Ct Pct: 1 % (ref 0.6–2.6)
Total Iron Binding Capacity: 314 ug/dL (ref 250–450)
UIBC: 282 ug/dL (ref 111–343)
Vitamin B-12: 250 pg/mL (ref 232–1245)
WBC: 3.7 10*3/uL (ref 3.4–10.8)

## 2022-01-31 LAB — CMP14+EGFR
ALT: 17 IU/L (ref 0–44)
AST: 20 IU/L (ref 0–40)
Albumin/Globulin Ratio: 1.8 (ref 1.2–2.2)
Albumin: 4.5 g/dL (ref 4.1–5.1)
Alkaline Phosphatase: 93 IU/L (ref 44–121)
BUN/Creatinine Ratio: 16 (ref 9–20)
BUN: 16 mg/dL (ref 6–24)
Bilirubin Total: 0.4 mg/dL (ref 0.0–1.2)
CO2: 23 mmol/L (ref 20–29)
Calcium: 8.8 mg/dL (ref 8.7–10.2)
Chloride: 96 mmol/L (ref 96–106)
Creatinine, Ser: 1.01 mg/dL (ref 0.76–1.27)
Globulin, Total: 2.5 g/dL (ref 1.5–4.5)
Glucose: 84 mg/dL (ref 70–99)
Potassium: 4 mmol/L (ref 3.5–5.2)
Sodium: 137 mmol/L (ref 134–144)
Total Protein: 7 g/dL (ref 6.0–8.5)
eGFR: 93 mL/min/{1.73_m2} (ref >59)

## 2022-01-31 LAB — THYROID PANEL WITH TSH
Free Thyroxine Index: 1.6 (ref 1.2–4.9)
T3 Uptake Ratio: 22 % — ABNORMAL LOW (ref 24–39)
T4, Total: 7.1 ug/dL (ref 4.5–12.0)
TSH: 2.22 u[IU]/mL (ref 0.450–4.500)

## 2022-02-03 LAB — NOVEL CORONAVIRUS, NAA: SARS-CoV-2, NAA: NOT DETECTED

## 2022-02-06 ENCOUNTER — Other Ambulatory Visit: Payer: Self-pay | Admitting: Nurse Practitioner

## 2022-02-06 MED ORDER — LANSOPRAZOLE 30 MG PO CPDR: 30.0000 mg | DELAYED_RELEASE_CAPSULE | Freq: Every day | ORAL | 1 refills | Status: DC

## 2022-02-09 ENCOUNTER — Other Ambulatory Visit: Payer: Self-pay | Admitting: Family Medicine

## 2022-02-09 MED ORDER — LANSOPRAZOLE 30 MG PO CPDR: 30.0000 mg | DELAYED_RELEASE_CAPSULE | Freq: Every day | ORAL | 3 refills | Status: DC

## 2022-02-09 MED ORDER — VILAZODONE HCL 40 MG PO TABS: 40.0000 mg | ORAL_TABLET | Freq: Every day | ORAL | 3 refills | Status: DC

## 2022-02-11 ENCOUNTER — Encounter: Payer: Self-pay | Admitting: Podiatry

## 2022-02-15 ENCOUNTER — Other Ambulatory Visit: Payer: Self-pay | Admitting: Family Medicine

## 2022-02-15 ENCOUNTER — Other Ambulatory Visit (HOSPITAL_COMMUNITY): Payer: Self-pay

## 2022-02-15 ENCOUNTER — Other Ambulatory Visit: Payer: Self-pay

## 2022-02-15 MED ORDER — LEVETIRACETAM 500 MG PO TABS
500.0000 mg | ORAL_TABLET | Freq: Two times a day (BID) | ORAL | 0 refills | Status: DC
  Filled 2022-02-15 – 2022-02-21 (×2): qty 180, 90d supply, fill #0

## 2022-02-16 ENCOUNTER — Other Ambulatory Visit (HOSPITAL_COMMUNITY): Payer: Self-pay

## 2022-02-16 ENCOUNTER — Encounter (HOSPITAL_COMMUNITY): Payer: Self-pay

## 2022-02-21 ENCOUNTER — Other Ambulatory Visit (HOSPITAL_COMMUNITY): Payer: Self-pay

## 2022-02-21 ENCOUNTER — Other Ambulatory Visit: Payer: Self-pay

## 2022-02-28 ENCOUNTER — Ambulatory Visit (INDEPENDENT_AMBULATORY_CARE_PROVIDER_SITE_OTHER): Payer: Commercial Managed Care - PPO | Admitting: Podiatry

## 2022-02-28 ENCOUNTER — Encounter: Payer: Self-pay | Admitting: Podiatry

## 2022-02-28 DIAGNOSIS — L97522 Non-pressure chronic ulcer of other part of left foot with fat layer exposed: Secondary | ICD-10-CM

## 2022-02-28 MED ORDER — MUPIROCIN 2 % EX OINT: 1.0000 | TOPICAL_OINTMENT | Freq: Every day | CUTANEOUS | 2 refills | Status: DC

## 2022-03-01 ENCOUNTER — Other Ambulatory Visit (HOSPITAL_COMMUNITY): Payer: Self-pay

## 2022-03-01 ENCOUNTER — Other Ambulatory Visit: Payer: Self-pay | Admitting: Family Medicine

## 2022-03-01 MED ORDER — LANSOPRAZOLE 30 MG PO CPDR
30.0000 mg | DELAYED_RELEASE_CAPSULE | Freq: Every day | ORAL | 3 refills | Status: DC
  Filled 2022-03-01 – 2022-03-05 (×2): qty 90, 90d supply, fill #0
  Filled 2022-05-19: qty 90, 90d supply, fill #1
  Filled 2022-08-22: qty 90, 90d supply, fill #2
  Filled 2022-11-19: qty 90, 90d supply, fill #3

## 2022-03-02 ENCOUNTER — Other Ambulatory Visit (HOSPITAL_COMMUNITY): Payer: Self-pay

## 2022-03-04 NOTE — Progress Notes (Signed)
  Subjective:  Patient ID: Carl Bruce, male    DOB: [birth year],  MRN: [id number]  Chief Complaint  Patient presents with   Callouses    L Great toe has callus    47 y.o. male presents with the above complaint. History confirmed with patient.  He returns for follow-up he has a new wound on the left great toe.  Has gone back to work, is wearing an old pair shoes  Objective:  Physical Exam: warm, good capillary refill, no trophic changes or ulcerative lesions, normal DP and PT pulses, and full-thickness ulcer distal left hallux measure 0.7 x 0.3 cm, exposed subcutaneous tissue, hyperkeratotic rim.  Subungual hematoma present with thickening of nail plate.  No signs of infection     Assessment:   1. Skin ulcer of toe of left foot with fat layer exposed ([place])      Plan:  Patient was evaluated and treated and all questions answered.  Ulcer was debrided of the hyperkeratotic tissue and nonviable tissue sharply with a scalpel in an excisional manner.  I recommend topical treatment with mupirocin no indication for oral antibiotics currently.  Likely appears to be quite superficial.  I discussed with him that I think this likely is due to shoe gear impact and pressure and he needs to reevaluate his shoe gear for this.  Shoe length may be even extended due to previous surgery and straightening of the toes.  I will see him back in 3 weeks for further wound care  Return in about 3 weeks (around 03/21/2022) for wound care.

## 2022-03-05 ENCOUNTER — Ambulatory Visit: Payer: 59 | Admitting: Podiatry

## 2022-03-05 ENCOUNTER — Other Ambulatory Visit (HOSPITAL_COMMUNITY): Payer: Self-pay

## 2022-03-05 ENCOUNTER — Other Ambulatory Visit: Payer: Self-pay | Admitting: Nurse Practitioner

## 2022-03-06 ENCOUNTER — Other Ambulatory Visit (HOSPITAL_COMMUNITY): Payer: Self-pay

## 2022-03-06 ENCOUNTER — Encounter: Payer: Self-pay | Admitting: Podiatry

## 2022-03-06 ENCOUNTER — Other Ambulatory Visit: Payer: Self-pay

## 2022-03-06 MED ORDER — VILAZODONE HCL 40 MG PO TABS
40.0000 mg | ORAL_TABLET | Freq: Every day | ORAL | 3 refills | Status: DC
  Filled 2022-03-06: qty 90, 90d supply, fill #0

## 2022-03-12 ENCOUNTER — Other Ambulatory Visit: Payer: Self-pay | Admitting: Nurse Practitioner

## 2022-03-12 MED ORDER — ARIPIPRAZOLE 5 MG PO TABS: 5.0000 mg | ORAL_TABLET | Freq: Every day | ORAL | 2 refills | Status: DC

## 2022-03-12 NOTE — Progress Notes (Signed)
Wean off viibryd- add abilify 5mg  daily  Meds ordered this encounter  Medications   ARIPiprazole (ABILIFY) 5 MG tablet    Sig: Take 1 tablet (5 mg total) by mouth daily.    Dispense:  30 tablet    Refill:  2    Order Specific Question:   Supervising Provider    Answer:   Worthy Rancher [[id number]]   [place], FNP

## 2022-03-15 ENCOUNTER — Encounter: Payer: Self-pay | Admitting: Podiatry

## 2022-03-19 ENCOUNTER — Other Ambulatory Visit: Payer: Self-pay

## 2022-03-19 ENCOUNTER — Ambulatory Visit (INDEPENDENT_AMBULATORY_CARE_PROVIDER_SITE_OTHER): Payer: Commercial Managed Care - PPO | Admitting: Nurse Practitioner

## 2022-03-19 ENCOUNTER — Other Ambulatory Visit (HOSPITAL_COMMUNITY): Payer: Self-pay

## 2022-03-19 ENCOUNTER — Encounter: Payer: Self-pay | Admitting: Nurse Practitioner

## 2022-03-19 VITALS — BP 129/77 | HR 86 | Temp 97.6°F | Resp 20 | Ht >= 80 in | Wt 293.0 lb

## 2022-03-19 DIAGNOSIS — E782 Mixed hyperlipidemia: Secondary | ICD-10-CM | POA: Diagnosis not present

## 2022-03-19 DIAGNOSIS — G40909 Epilepsy, unspecified, not intractable, without status epilepticus: Secondary | ICD-10-CM

## 2022-03-19 DIAGNOSIS — I10 Essential (primary) hypertension: Secondary | ICD-10-CM

## 2022-03-19 DIAGNOSIS — Q87410 Marfan's syndrome with aortic dilation: Secondary | ICD-10-CM | POA: Diagnosis not present

## 2022-03-19 DIAGNOSIS — F411 Generalized anxiety disorder: Secondary | ICD-10-CM

## 2022-03-19 MED ORDER — METOPROLOL TARTRATE 100 MG PO TABS
100.0000 mg | ORAL_TABLET | Freq: Two times a day (BID) | ORAL | 3 refills | Status: DC
  Filled 2022-03-19: qty 180, 90d supply, fill #0
  Filled 2022-08-22: qty 180, 90d supply, fill #1

## 2022-03-19 MED ORDER — LOSARTAN POTASSIUM 25 MG PO TABS
25.0000 mg | ORAL_TABLET | Freq: Two times a day (BID) | ORAL | 3 refills | Status: DC
  Filled 2022-03-19: qty 180, 90d supply, fill #0
  Filled 2022-08-22: qty 180, 90d supply, fill #1

## 2022-03-19 MED ORDER — LEVETIRACETAM 500 MG PO TABS
500.0000 mg | ORAL_TABLET | Freq: Two times a day (BID) | ORAL | 1 refills | Status: DC
  Filled 2022-03-19: qty 180, 90d supply, fill #0

## 2022-03-19 MED ORDER — ROSUVASTATIN CALCIUM 20 MG PO TABS
20.0000 mg | ORAL_TABLET | Freq: Every day | ORAL | 1 refills | Status: DC
  Filled 2022-03-19: qty 90, 90d supply, fill #0
  Filled 2022-06-18: qty 90, 90d supply, fill #1
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
## 2022-03-19 NOTE — Patient Instructions (Signed)

## 2022-03-20 LAB — CBC WITH DIFFERENTIAL/PLATELET
Basophils Absolute: 0.1 10*3/uL (ref 0.0–0.2)
Basos: 1 %
EOS (ABSOLUTE): 0.2 10*3/uL (ref 0.0–0.4)
Eos: 3 %
Hematocrit: 43.8 % (ref 37.5–51.0)
Hemoglobin: 14.7 g/dL (ref 13.0–17.7)
Immature Grans (Abs): 0 10*3/uL (ref 0.0–0.1)
Immature Granulocytes: 0 %
Lymphocytes Absolute: 1.9 10*3/uL (ref 0.7–3.1)
Lymphs: 26 %
MCH: 27.8 pg (ref 26.6–33.0)
MCHC: 33.6 g/dL (ref 31.5–35.7)
MCV: 83 fL (ref 79–97)
Monocytes Absolute: 0.6 10*3/uL (ref 0.1–0.9)
Monocytes: 8 %
Neutrophils Absolute: 4.6 10*3/uL (ref 1.4–7.0)
Neutrophils: 62 %
Platelets: 253 10*3/uL (ref 150–450)
RBC: 5.28 x10E6/uL (ref 4.14–5.80)
RDW: 13.4 % (ref 11.6–15.4)
WBC: 7.4 10*3/uL (ref 3.4–10.8)

## 2022-03-20 LAB — LIPID PANEL
Chol/HDL Ratio: 3 ratio (ref 0.0–5.0)
Cholesterol, Total: 120 mg/dL (ref 100–199)
HDL: 40 mg/dL (ref >39)
LDL Chol Calc (NIH): 63 mg/dL (ref 0–99)
Triglycerides: 85 mg/dL (ref 0–149)
VLDL Cholesterol Cal: 17 mg/dL (ref 5–40)

## 2022-03-20 LAB — CMP14+EGFR
ALT: 15 IU/L (ref 0–44)
AST: 20 IU/L (ref 0–40)
Albumin/Globulin Ratio: 2.2 (ref 1.2–2.2)
Albumin: 4.8 g/dL (ref 4.1–5.1)
Alkaline Phosphatase: 83 IU/L (ref 44–121)
BUN/Creatinine Ratio: 12 (ref 9–20)
BUN: 11 mg/dL (ref 6–24)
Bilirubin Total: 0.3 mg/dL (ref 0.0–1.2)
CO2: 24 mmol/L (ref 20–29)
Calcium: 9.8 mg/dL (ref 8.7–10.2)
Chloride: 101 mmol/L (ref 96–106)
Creatinine, Ser: 0.93 mg/dL (ref 0.76–1.27)
Globulin, Total: 2.2 g/dL (ref 1.5–4.5)
Glucose: 75 mg/dL (ref 70–99)
Potassium: 4.2 mmol/L (ref 3.5–5.2)
Sodium: 142 mmol/L (ref 134–144)
Total Protein: 7 g/dL (ref 6.0–8.5)
eGFR: 103 mL/min/{1.73_m2} (ref >59)

## 2022-03-22 ENCOUNTER — Other Ambulatory Visit (HOSPITAL_COMMUNITY): Payer: Self-pay

## 2022-03-28 ENCOUNTER — Ambulatory Visit (INDEPENDENT_AMBULATORY_CARE_PROVIDER_SITE_OTHER): Payer: Commercial Managed Care - PPO | Admitting: Podiatry

## 2022-03-28 ENCOUNTER — Encounter: Payer: Self-pay | Admitting: Podiatry

## 2022-03-28 DIAGNOSIS — L603 Nail dystrophy: Secondary | ICD-10-CM | POA: Diagnosis not present

## 2022-03-28 DIAGNOSIS — L97522 Non-pressure chronic ulcer of other part of left foot with fat layer exposed: Secondary | ICD-10-CM | POA: Diagnosis not present

## 2022-03-28 NOTE — Patient Instructions (Signed)

## 2022-03-31 NOTE — Progress Notes (Signed)
  Subjective:  Patient ID: Carl Bruce, male    DOB: 1975/11/05,  MRN: [id number]  Chief Complaint  Patient presents with   Foot Ulcer    3 week follow up left great toe    47 y.o. male presents with the above complaint. History confirmed with patient.  He returns for follow-up he has has been using the mupirocin ointment, continues to have pressure and bleeding under the skin  Objective:  Physical Exam: warm, good capillary refill, no trophic changes or ulcerative lesions, normal DP and PT pulses, and full-thickness ulcer distal left hallux measure 0.3 x 0.3 cm, much smaller in diameter, exposed subcutaneous tissue, hyperkeratotic rim.  Continued subungual hematoma present with thickening of nail plate and blood blister formation on the distal tip of the soft tissue of the toenail.  No signs of infection     Assessment:   1. Skin ulcer of toe of left foot with fat layer exposed ([place])   2. Nail dystrophy      Plan:  Patient was evaluated and treated and all questions answered.  Ulcer was debrided of the hyperkeratotic tissue and nonviable tissue sharply with a scalpel in an excisional manner.  Postdebridement measurements noted above.  Continue use of the mupirocin ointment until this heals.  I discussed with him and his wife that I suspect the significant thickening and dystrophy of the nail plate is causing increased pressure and plantar flexing the toe due to shoe gear leading to worsening of the ulceration.  I recommended avulsion of the nail plate with a permanent matricectomy.  We discussed the risks and benefits of this.  Following consent and digital block with lidocaine and Marcaine the left hallux nail plate was avulsed and the reapplication's of phenolic acid were applied.  Post care instructions including soaking were given.  Continue use of mupirocin ointment on the nailbed as well as the distal hallux.  I will see him back in 3 weeks for follow-up for this.  Return in  about 3 weeks (around 04/18/2022) for wound care.
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
## 2022-04-04 ENCOUNTER — Encounter: Payer: Self-pay | Admitting: Cardiology

## 2022-04-04 ENCOUNTER — Ambulatory Visit (INDEPENDENT_AMBULATORY_CARE_PROVIDER_SITE_OTHER): Payer: Commercial Managed Care - PPO | Admitting: Cardiology

## 2022-04-04 VITALS — BP 128/70 | HR 76 | Ht >= 80 in | Wt 294.0 lb

## 2022-04-04 DIAGNOSIS — I10 Essential (primary) hypertension: Secondary | ICD-10-CM

## 2022-04-04 DIAGNOSIS — Q87410 Marfan's syndrome with aortic dilation: Secondary | ICD-10-CM | POA: Diagnosis not present

## 2022-04-04 DIAGNOSIS — E785 Hyperlipidemia, unspecified: Secondary | ICD-10-CM | POA: Diagnosis not present

## 2022-04-04 NOTE — Patient Instructions (Signed)
Medication Instructions:  The current medical regimen is effective;  continue present plan and medications.  *If you need a refill on your cardiac medications before your next appointment, please call your pharmacy*  Follow-Up: At [facility], you and your health needs are our priority.  As part of our continuing mission to provide you with exceptional heart care, we have created designated Provider Care Teams.  These Care Teams include your primary Cardiologist (physician) and Advanced Practice Providers (APPs -  Physician Assistants and Nurse Practitioners) who all work together to provide you with the care you need, when you need it.  We recommend signing up for the patient portal called "MyChart".  Sign up information is provided on this After Visit Summary.  MyChart is used to connect with patients for Virtual Visits (Telemedicine).  Patients are able to view lab/test results, encounter notes, upcoming appointments, etc.  Non-urgent messages can be sent to your provider as well.   To learn more about what you can do with MyChart, go to [email or web address].    Your next appointment:   1 year(s)  Provider:   Minus Breeding, MD

## 2022-04-12 DIAGNOSIS — G4733 Obstructive sleep apnea (adult) (pediatric): Secondary | ICD-10-CM | POA: Diagnosis not present

## 2022-04-18 ENCOUNTER — Ambulatory Visit: Payer: Commercial Managed Care - PPO | Admitting: Podiatry

## 2022-04-18 ENCOUNTER — Encounter: Payer: Self-pay | Admitting: Podiatry

## 2022-04-18 DIAGNOSIS — L603 Nail dystrophy: Secondary | ICD-10-CM

## 2022-04-18 DIAGNOSIS — L97522 Non-pressure chronic ulcer of other part of left foot with fat layer exposed: Secondary | ICD-10-CM

## 2022-04-19 ENCOUNTER — Other Ambulatory Visit (HOSPITAL_COMMUNITY): Payer: Self-pay

## 2022-04-19 ENCOUNTER — Other Ambulatory Visit: Payer: Self-pay

## 2022-04-19 ENCOUNTER — Other Ambulatory Visit: Payer: Self-pay | Admitting: Nurse Practitioner

## 2022-04-19 MED ORDER — ARIPIPRAZOLE 10 MG PO TABS
10.0000 mg | ORAL_TABLET | Freq: Every day | ORAL | 1 refills | Status: DC
Start: 2022-04-19 — End: 2022-08-22
  Filled 2022-04-19: qty 90, 90d supply, fill #0
  Filled 2022-07-18: qty 90, 90d supply, fill #1

## 2022-04-23 NOTE — Progress Notes (Signed)
  Subjective:  Patient ID: Carl Bruce, male    DOB: May 02, 1975,  MRN: [id number]  Chief Complaint  Patient presents with   Foot Ulcer    3 week follow up left great toe    47 y.o. male presents with the above complaint. History confirmed with patient.  He returns for follow-up he notes that it is feeling much better  Objective:  Physical Exam: warm, good capillary refill, no trophic changes or ulcerative lesions, normal DP and PT pulses, and full-thickness ulcer distal left hallux measure 0.3 x x 0.4 x 0.3 cm, improved in appearance and in diameter, exposed subcutaneous tissue, hyperkeratotic rim.  Nail matricectomy site is improving     Assessment:   1. Skin ulcer of toe of left foot with fat layer exposed ([place])   2. Nail dystrophy      Plan:  Patient was evaluated and treated and all questions answered.  Ulcer was debrided of the hyperkeratotic tissue and nonviable tissue sharply with a scalpel in an excisional manner to the subcutaneous layer.  Postdebridement measurements noted above.  Continue use of the mupirocin ointment, changing daily.  No signs of infection improving.  His nail matricectomy site is healing well he can leave this open to air.  I will see him back in 3 weeks for wound care  Return in about 3 weeks (around 05/09/2022) for wound care.

## 2022-05-08 ENCOUNTER — Encounter: Payer: Self-pay | Admitting: Podiatry

## 2022-05-10 MED ORDER — CIPROFLOXACIN HCL 500 MG PO TABS: 500.0000 mg | ORAL_TABLET | Freq: Two times a day (BID) | ORAL | 0 refills | Status: AC

## 2022-05-13 DIAGNOSIS — G4733 Obstructive sleep apnea (adult) (pediatric): Secondary | ICD-10-CM | POA: Diagnosis not present

## 2022-05-16 ENCOUNTER — Ambulatory Visit: Payer: Commercial Managed Care - PPO | Admitting: Podiatry

## 2022-05-16 DIAGNOSIS — L97522 Non-pressure chronic ulcer of other part of left foot with fat layer exposed: Secondary | ICD-10-CM | POA: Diagnosis not present

## 2022-05-16 DIAGNOSIS — M2032 Hallux varus (acquired), left foot: Secondary | ICD-10-CM

## 2022-05-16 NOTE — Patient Instructions (Signed)
Call [facility] to schedule your MRI at the below locations.  Please allow at least 1 business day after your visit to process the referral.  It may take longer depending on approval from insurance.  Please let me know if you have issues or problems scheduling the MRI   [facility] [place] [phone] [address] [place], [place] [address]  [facility] [place] [phone] [address] [place],  [address]  

## 2022-05-19 ENCOUNTER — Other Ambulatory Visit (HOSPITAL_COMMUNITY): Payer: Self-pay

## 2022-05-20 NOTE — Progress Notes (Signed)
  Subjective:  Patient ID: Carl Bruce, male    DOB: Mar 24, 1975,  MRN: [id number]  Chief Complaint  Patient presents with   Foot Ulcer    3 week follow up left foot    47 y.o. male presents with the above complaint. History confirmed with patient.  They feel he got worse, they have been taking the antibiotics prescribed to him and it has improved since then some but still giving trouble  Objective:  Physical Exam: warm, good capillary refill, no trophic changes or ulcerative lesions, normal DP and PT pulses, and full-thickness ulcer distal left hallux measure 0.5 x 0.5 x 0.3 cm, matricectomy site healing well still, there is a large amount of hyperkeratosis and blistering     Assessment:   1. Skin ulcer of toe of left foot with fat layer exposed   2. Hallux malleus, left      Plan:  Patient was evaluated and treated and all questions answered.  Suspect the worsening was a mild infection that developed after closure of the callus overlying the wound and epidermolysis and blistering.  Continue using the ointment and antibiotics.  I did recommend an MRI at this point considering the chronicity of this, I suspect his hallux malleus is a major factor and may need IPJ fusion and Jones tenosuspension.  Would like to rule out infection prior to proceeding with this.  I will see him back after the MRI in 3 weeks for further wound care.  Return in about 3 weeks (around 06/06/2022) for wound care, after MRI to review.

## 2022-05-21 ENCOUNTER — Other Ambulatory Visit (HOSPITAL_COMMUNITY): Payer: Self-pay

## 2022-05-21 ENCOUNTER — Encounter: Payer: Self-pay | Admitting: Podiatry

## 2022-05-21 ENCOUNTER — Other Ambulatory Visit: Payer: Self-pay | Admitting: Family Medicine

## 2022-05-21 ENCOUNTER — Other Ambulatory Visit: Payer: Self-pay

## 2022-05-21 DIAGNOSIS — G40909 Epilepsy, unspecified, not intractable, without status epilepticus: Secondary | ICD-10-CM

## 2022-05-21 MED ORDER — LEVETIRACETAM 500 MG PO TABS
500.0000 mg | ORAL_TABLET | Freq: Two times a day (BID) | ORAL | 1 refills | Status: DC
  Filled 2022-05-21: qty 180, 90d supply, fill #0
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
## 2022-05-21 NOTE — Patient Instructions (Signed)
Below is our plan:  We will continue levetiracetam 500mg twice daily   Please make sure you are consistent with timing of seizure medication. I recommend annual visit with primary care provider (PCP) for complete physical and routine blood work. I recommend daily intake of vitamin D (400-800iu) and calcium (800-1000mg) for bone health. Discuss Dexa screening with PCP.   According to [place] law, you can not drive unless you are seizure / syncope free for at least 6 months and under physician's care.  Please maintain precautions. Do not participate in activities where a loss of awareness could harm you or someone else. No swimming alone, no tub bathing, no hot tubs, no driving, no operating motorized vehicles (cars, ATVs, motocycles, etc), lawnmowers, power tools or firearms. No standing at heights, such as rooftops, ladders or stairs. Avoid hot objects such as stoves, heaters, open fires. Wear a helmet when riding a bicycle, scooter, skateboard, etc. and avoid areas of traffic. Set your water heater to 120 degrees or less.  Please make sure you are staying well hydrated. I recommend 50-60 ounces daily. Well balanced diet and regular exercise encouraged. Consistent sleep schedule with 6-8 hours recommended.   Please continue follow up with care team as directed.   Follow up with me in 1 year  You may receive a survey regarding today's visit. I encourage you to leave honest feed back as I do use this information to improve patient care. Thank you for seeing me today!    

## 2022-05-22 ENCOUNTER — Encounter: Payer: Self-pay | Admitting: Family Medicine

## 2022-05-22 ENCOUNTER — Other Ambulatory Visit (HOSPITAL_COMMUNITY): Payer: Self-pay

## 2022-05-22 ENCOUNTER — Ambulatory Visit: Payer: Commercial Managed Care - PPO | Admitting: Family Medicine

## 2022-05-22 VITALS — BP 135/65 | HR 56 | Ht >= 80 in | Wt 293.4 lb

## 2022-05-22 DIAGNOSIS — G40909 Epilepsy, unspecified, not intractable, without status epilepticus: Secondary | ICD-10-CM | POA: Diagnosis not present

## 2022-05-22 DIAGNOSIS — Z87828 Personal history of other (healed) physical injury and trauma: Secondary | ICD-10-CM | POA: Diagnosis not present

## 2022-05-22 MED ORDER — LEVETIRACETAM 500 MG PO TABS
500.0000 mg | ORAL_TABLET | Freq: Two times a day (BID) | ORAL | 3 refills | Status: DC
  Filled 2022-05-22 – 2022-08-22 (×2): qty 180, 90d supply, fill #0

## 2022-05-22 NOTE — Addendum Note (Signed)
Addended byLilian Kapur, Altonio Schwertner R on: 05/22/2022 07:22 AM   Modules accepted: Orders

## 2022-06-06 ENCOUNTER — Ambulatory Visit
Admission: RE | Admit: 2022-06-06 | Discharge: 2022-06-06 | Disposition: A | Discharge status: Home / Self Care | Payer: Commercial Managed Care - PPO | Source: Ambulatory Visit | Attending: Podiatry | Admitting: Podiatry

## 2022-06-06 DIAGNOSIS — L97522 Non-pressure chronic ulcer of other part of left foot with fat layer exposed: Secondary | ICD-10-CM

## 2022-06-06 DIAGNOSIS — M869 Osteomyelitis, unspecified: Secondary | ICD-10-CM | POA: Diagnosis not present

## 2022-06-07 ENCOUNTER — Ambulatory Visit: Payer: Commercial Managed Care - PPO | Admitting: Podiatry

## 2022-06-07 DIAGNOSIS — M2032 Hallux varus (acquired), left foot: Secondary | ICD-10-CM | POA: Diagnosis not present

## 2022-06-07 DIAGNOSIS — M869 Osteomyelitis, unspecified: Secondary | ICD-10-CM

## 2022-06-07 NOTE — Progress Notes (Signed)
  Subjective:  Patient ID: Carl Bruce, male    DOB: 04/18/1975,  MRN: [id number]  Chief Complaint  Patient presents with   Foot Ulcer    wound care 3 wks, after MRI to review    47 y.o. male presents with the above complaint. History confirmed with patient.  He has completed the  Objective:  Physical Exam: warm, good capillary refill, no trophic changes or ulcerative lesions, normal DP and PT pulses, and full-thickness ulcer distal left hallux improved measures 0.2 to 0.2 x 0.2  MRI shows distal phalanx osteomyelitis   Assessment:   1. Hallux malleus, left   2. Osteomyelitis of great toe of left foot (HCC)      Plan:  Patient was evaluated and treated and all questions answered.  MRI results reviewed with the patient and his wife.  We discussed the presence of the hallux malleus deformity as well.  He does have osteomyelitis of the distal phalanx.  I do not think further antibiotics are likely to be able to cure this and he will have a high chance of recurrence due to the hallux malleus deformity.  I did recommend a partial hallux amputation.  All risks and benefits discussed.  All questions addressed.  Informed consent signed and reviewed.  No follow-ups on file.

## 2022-06-11 ENCOUNTER — Telehealth: Payer: Self-pay | Admitting: Urology

## 2022-06-11 NOTE — Telephone Encounter (Signed)
DOS - 06/15/22  AMPUTATION TOE IPJ 1ST LEFT --- 16109  [employer] EFFECTIVE DATE - 02/05/22  PER [employer]'S AUTOMATED SYSTEM FOR CPT CODE 60454 NO PRIOR AUTH IS REQUIRED.  CALL REF # [id number]

## 2022-06-12 DIAGNOSIS — G4733 Obstructive sleep apnea (adult) (pediatric): Secondary | ICD-10-CM | POA: Diagnosis not present

## 2022-06-15 ENCOUNTER — Encounter: Payer: Self-pay | Admitting: Podiatry

## 2022-06-15 ENCOUNTER — Other Ambulatory Visit: Payer: Self-pay | Admitting: Podiatry

## 2022-06-15 DIAGNOSIS — M2042 Other hammer toe(s) (acquired), left foot: Secondary | ICD-10-CM | POA: Diagnosis not present

## 2022-06-15 DIAGNOSIS — M2062 Acquired deformities of toe(s), unspecified, left foot: Secondary | ICD-10-CM | POA: Diagnosis not present

## 2022-06-15 DIAGNOSIS — M868X7 Other osteomyelitis, ankle and foot: Secondary | ICD-10-CM | POA: Diagnosis not present

## 2022-06-15 DIAGNOSIS — M86672 Other chronic osteomyelitis, left ankle and foot: Secondary | ICD-10-CM | POA: Diagnosis not present

## 2022-06-15 MED ORDER — CIPROFLOXACIN HCL 500 MG PO TABS: 500.0000 mg | ORAL_TABLET | Freq: Two times a day (BID) | ORAL | 0 refills | Status: AC

## 2022-06-15 MED ORDER — OXYCODONE-ACETAMINOPHEN 5-325 MG PO TABS: 1.0000 | ORAL_TABLET | Freq: Four times a day (QID) | ORAL | 0 refills | Status: AC | PRN

## 2022-06-15 MED ORDER — DOXYCYCLINE HYCLATE 100 MG PO TABS: 100.0000 mg | ORAL_TABLET | Freq: Two times a day (BID) | ORAL | 0 refills | Status: DC

## 2022-06-15 MED ORDER — IBUPROFEN 600 MG PO TABS: 600.0000 mg | ORAL_TABLET | Freq: Four times a day (QID) | ORAL | 0 refills | Status: AC | PRN

## 2022-06-18 ENCOUNTER — Other Ambulatory Visit (HOSPITAL_COMMUNITY): Payer: Self-pay

## 2022-06-21 ENCOUNTER — Ambulatory Visit (INDEPENDENT_AMBULATORY_CARE_PROVIDER_SITE_OTHER): Payer: Commercial Managed Care - PPO | Admitting: Podiatry

## 2022-06-21 DIAGNOSIS — M869 Osteomyelitis, unspecified: Secondary | ICD-10-CM

## 2022-06-22 ENCOUNTER — Encounter: Payer: Self-pay | Admitting: Podiatry

## 2022-06-22 MED ORDER — CIPROFLOXACIN HCL 500 MG PO TABS: 500.0000 mg | ORAL_TABLET | Freq: Two times a day (BID) | ORAL | 0 refills | Status: AC

## 2022-06-25 NOTE — Progress Notes (Signed)
  Subjective:  Patient ID: Carl Bruce, male    DOB: Sep 17, 1975,  MRN: [id number]  Chief Complaint  Patient presents with   Routine Post Op    POV #1 DOS 06/15/2022 PARTIAL LT GREAT TOE AMPUTATION    47 y.o. male returns for post-op check.  He is doing well not having much pain  Review of Systems: Negative except as noted in the HPI. Denies N/V/F/Ch.   Objective:  There were no vitals filed for this visit. There is no height or weight on file to calculate BMI. Constitutional Well developed. Well nourished.  Vascular Foot warm and well perfused. Capillary refill normal to all digits.  Calf is soft and supple, no posterior calf or knee pain, negative Homans' sign  Neurologic Normal speech. Oriented to person, place, and time. Epicritic sensation to light touch grossly present bilaterally.  Dermatologic Skin healing well without signs of infection. Skin edges well coapted without signs of infection.  Some maceration  Orthopedic: He has little to no tenderness to palpation noted about the surgical site.    Assessment:   1. Osteomyelitis of great toe of left foot (HCC)    Plan:  Patient was evaluated and treated and all questions answered.  S/p foot surgery left -Progressing as expected post-operatively.  Some maceration noted.  Begin every other day changes with Betadine- -continue WBAT in surgical shoe.  Advised to rest is much as possible -Sutures: Plan to remove in 2 weeks. 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  Return in about 2 weeks (around 07/05/2022) for suture removal, post op (no x-rays).

## 2022-07-05 ENCOUNTER — Ambulatory Visit (INDEPENDENT_AMBULATORY_CARE_PROVIDER_SITE_OTHER): Payer: Commercial Managed Care - PPO | Admitting: Podiatry

## 2022-07-05 DIAGNOSIS — M869 Osteomyelitis, unspecified: Secondary | ICD-10-CM

## 2022-07-06 NOTE — Progress Notes (Signed)
  Subjective:  Patient ID: Carl Bruce, male    DOB: 17-Sep-1975,  MRN: [id number]  Chief Complaint  Patient presents with   Routine Post Op    POV #2 DOS 06/15/2022 PARTIAL LT GREAT TOE AMPUTATION    47 y.o. male returns for post-op check.  He is doing well not having much pain  Review of Systems: Negative except as noted in the HPI. Denies N/V/F/Ch.   Objective:  There were no vitals filed for this visit. There is no height or weight on file to calculate BMI. Constitutional Well developed. Well nourished.  Vascular Foot warm and well perfused. Capillary refill normal to all digits.  Calf is soft and supple, no posterior calf or knee pain, negative Homans' sign  Neurologic Normal speech. Oriented to person, place, and time. Epicritic sensation to light touch grossly present bilaterally.  Dermatologic Skin healing well without signs of infection. Skin edges well coapted without signs of infection.  Some maceration  Orthopedic: He has little to no tenderness to palpation noted about the surgical site.    Assessment:   1. Osteomyelitis of great toe of left foot (HCC)    Plan:  Patient was evaluated and treated and all questions answered.  S/p foot surgery left -Doing very well and appears to be fully healed without any signs of residual infection.  Staples and sutures are removed.  May return to regular shoe gear and activity.  Monitor closely for recurrence  Return in about 2 months (around 09/04/2022) for partial toe amputation follow up .

## 2022-07-18 ENCOUNTER — Other Ambulatory Visit (HOSPITAL_COMMUNITY): Payer: Self-pay

## 2022-07-25 ENCOUNTER — Encounter: Payer: Commercial Managed Care - PPO | Admitting: Podiatry

## 2022-08-07 ENCOUNTER — Encounter: Payer: Self-pay | Admitting: Family Medicine

## 2022-08-10 DIAGNOSIS — G4733 Obstructive sleep apnea (adult) (pediatric): Secondary | ICD-10-CM | POA: Diagnosis not present

## 2022-08-22 ENCOUNTER — Other Ambulatory Visit: Payer: Self-pay | Admitting: Nurse Practitioner

## 2022-08-22 DIAGNOSIS — E782 Mixed hyperlipidemia: Secondary | ICD-10-CM

## 2022-08-23 ENCOUNTER — Other Ambulatory Visit (HOSPITAL_COMMUNITY): Payer: Self-pay

## 2022-08-23 ENCOUNTER — Other Ambulatory Visit: Payer: Self-pay

## 2022-08-23 MED ORDER — ARIPIPRAZOLE 10 MG PO TABS
10.0000 mg | ORAL_TABLET | Freq: Every day | ORAL | 0 refills | Status: DC
  Filled 2022-08-23 – 2022-11-04 (×2): qty 90, 90d supply, fill #0

## 2022-08-23 MED ORDER — ROSUVASTATIN CALCIUM 20 MG PO TABS
20.0000 mg | ORAL_TABLET | Freq: Every day | ORAL | 0 refills | Status: DC
  Filled 2022-08-23 – 2022-11-04 (×2): qty 90, 90d supply, fill #0

## 2022-08-27 ENCOUNTER — Other Ambulatory Visit: Payer: Self-pay | Admitting: Family Medicine

## 2022-08-27 DIAGNOSIS — G40909 Epilepsy, unspecified, not intractable, without status epilepticus: Secondary | ICD-10-CM

## 2022-08-27 MED ORDER — LEVETIRACETAM 500 MG PO TABS: 500.0000 mg | ORAL_TABLET | Freq: Two times a day (BID) | ORAL | 0 refills | Status: DC

## 2022-08-30 ENCOUNTER — Ambulatory Visit (INDEPENDENT_AMBULATORY_CARE_PROVIDER_SITE_OTHER): Payer: Commercial Managed Care - PPO | Admitting: Podiatry

## 2022-08-30 ENCOUNTER — Ambulatory Visit (INDEPENDENT_AMBULATORY_CARE_PROVIDER_SITE_OTHER): Payer: Commercial Managed Care - PPO

## 2022-08-30 DIAGNOSIS — M869 Osteomyelitis, unspecified: Secondary | ICD-10-CM

## 2022-08-30 DIAGNOSIS — M79672 Pain in left foot: Secondary | ICD-10-CM

## 2022-08-30 DIAGNOSIS — M2032 Hallux varus (acquired), left foot: Secondary | ICD-10-CM | POA: Diagnosis not present

## 2022-08-30 DIAGNOSIS — Z9889 Other specified postprocedural states: Secondary | ICD-10-CM

## 2022-08-31 ENCOUNTER — Other Ambulatory Visit: Payer: Self-pay | Admitting: Podiatry

## 2022-08-31 DIAGNOSIS — Z9889 Other specified postprocedural states: Secondary | ICD-10-CM

## 2022-09-02 NOTE — Progress Notes (Signed)
  Subjective:  Patient ID: Carl Bruce, male    DOB: 1975-06-22,  MRN: [id number]  Chief Complaint  Patient presents with   Routine Post Op    POV #3 DOS 06/15/2022 PARTIAL LT GREAT TOE AMPUTATION/DR Trace Wirick PT    47 y.o. male returns for post-op check.  He continues to do well no new issues or evidence of new or recurrent infection  Review of Systems: Negative except as noted in the HPI. Denies N/V/F/Ch.   Objective:  There were no vitals filed for this visit. There is no height or weight on file to calculate BMI. Constitutional Well developed. Well nourished.  Vascular Foot warm and well perfused. Capillary refill normal to all digits.  Calf is soft and supple, no posterior calf or knee pain, negative Homans' sign  Neurologic Normal speech. Oriented to person, place, and time. Epicritic sensation to light touch grossly present bilaterally.  Dermatologic Incision is well healed non hypertrophic and no drainage  Orthopedic: He has little to no tenderness to palpation noted about the surgical site.    Radiographs taken today show no recurrence of osteomyelitis or infection.  Assessment:   1. Post-operative state   2. Osteomyelitis of great toe of left foot (HCC)   3. Hallux malleus, left    Plan:  Patient was evaluated and treated and all questions answered.  S/p foot surgery left -Doing well. Healed without complication. Continue regular shoe gear as tolerated. Follow up with me PRN   Return if symptoms worsen or fail to improve.

## 2022-09-06 ENCOUNTER — Encounter: Payer: Self-pay | Admitting: Podiatry

## 2022-09-06 MED ORDER — CIPROFLOXACIN HCL 500 MG PO TABS: 500.0000 mg | ORAL_TABLET | Freq: Two times a day (BID) | ORAL | 0 refills | Status: AC

## 2022-09-10 DIAGNOSIS — G4733 Obstructive sleep apnea (adult) (pediatric): Secondary | ICD-10-CM | POA: Diagnosis not present

## 2022-10-02 ENCOUNTER — Telehealth: Payer: Self-pay | Admitting: Family Medicine

## 2022-10-02 ENCOUNTER — Encounter: Payer: Self-pay | Admitting: Family Medicine

## 2022-10-02 NOTE — Telephone Encounter (Signed)
Sent mychart msg and letter in mail informing pt of appt change due to NP being out

## 2022-10-11 DIAGNOSIS — G4733 Obstructive sleep apnea (adult) (pediatric): Secondary | ICD-10-CM | POA: Diagnosis not present

## 2022-11-05 ENCOUNTER — Other Ambulatory Visit (HOSPITAL_COMMUNITY): Payer: Self-pay

## 2022-11-05 ENCOUNTER — Other Ambulatory Visit: Payer: Self-pay

## 2022-11-06 ENCOUNTER — Other Ambulatory Visit: Payer: Self-pay

## 2022-11-06 ENCOUNTER — Other Ambulatory Visit: Payer: Self-pay | Admitting: Nurse Practitioner

## 2022-11-06 ENCOUNTER — Other Ambulatory Visit (HOSPITAL_COMMUNITY): Payer: Self-pay

## 2022-11-06 MED ORDER — VILAZODONE HCL 20 MG PO TABS
20.0000 mg | ORAL_TABLET | Freq: Every day | ORAL | 3 refills | Status: DC
  Filled 2022-11-06: qty 90, 90d supply, fill #0
  Filled 2023-02-07 – 2023-03-07 (×3): qty 90, 90d supply, fill #1

## 2022-11-14 ENCOUNTER — Other Ambulatory Visit (HOSPITAL_COMMUNITY): Payer: Self-pay

## 2022-11-19 ENCOUNTER — Other Ambulatory Visit (HOSPITAL_COMMUNITY): Payer: Self-pay

## 2022-11-20 ENCOUNTER — Other Ambulatory Visit: Payer: Self-pay

## 2022-11-20 ENCOUNTER — Other Ambulatory Visit (HOSPITAL_COMMUNITY): Payer: Self-pay

## 2022-11-20 ENCOUNTER — Other Ambulatory Visit: Payer: Self-pay | Admitting: Family Medicine

## 2022-11-20 DIAGNOSIS — G40909 Epilepsy, unspecified, not intractable, without status epilepticus: Secondary | ICD-10-CM

## 2022-11-20 MED ORDER — LEVETIRACETAM 500 MG PO TABS
500.0000 mg | ORAL_TABLET | Freq: Two times a day (BID) | ORAL | 3 refills | Status: DC
Start: 2022-11-20 — End: 2023-05-29
  Filled 2022-11-20: qty 180, 90d supply, fill #0
  Filled 2023-02-11 – 2023-02-21 (×3): qty 180, 90d supply, fill #1
  Filled 2023-05-19: qty 180, 90d supply, fill #2

## 2022-11-21 NOTE — Telephone Encounter (Signed)
Rx was sent on 11/20/22 by PCP Dr.Martin #180 tablets with 3 refills ,  Rx denied

## 2022-11-28 DIAGNOSIS — M25551 Pain in right hip: Secondary | ICD-10-CM | POA: Diagnosis not present

## 2022-11-28 DIAGNOSIS — M545 Low back pain, unspecified: Secondary | ICD-10-CM | POA: Diagnosis not present

## 2022-12-08 DIAGNOSIS — M545 Low back pain, unspecified: Secondary | ICD-10-CM | POA: Diagnosis not present

## 2022-12-25 ENCOUNTER — Other Ambulatory Visit (HOSPITAL_COMMUNITY): Payer: Self-pay

## 2022-12-25 DIAGNOSIS — G4733 Obstructive sleep apnea (adult) (pediatric): Secondary | ICD-10-CM | POA: Diagnosis not present

## 2023-01-24 DIAGNOSIS — G4733 Obstructive sleep apnea (adult) (pediatric): Secondary | ICD-10-CM | POA: Diagnosis not present

## 2023-02-07 ENCOUNTER — Other Ambulatory Visit: Payer: Self-pay | Admitting: Family Medicine

## 2023-02-07 ENCOUNTER — Other Ambulatory Visit (HOSPITAL_COMMUNITY): Payer: Self-pay

## 2023-02-07 DIAGNOSIS — E782 Mixed hyperlipidemia: Secondary | ICD-10-CM

## 2023-02-07 MED ORDER — ARIPIPRAZOLE 10 MG PO TABS: 10.0000 mg | ORAL_TABLET | Freq: Every day | ORAL | 0 refills | Status: DC

## 2023-02-07 MED ORDER — ROSUVASTATIN CALCIUM 20 MG PO TABS
20.0000 mg | ORAL_TABLET | Freq: Every day | ORAL | 0 refills | Status: DC
Start: 2023-02-07 — End: 2023-02-07
  Filled 2023-02-07: qty 90, 90d supply, fill #0

## 2023-02-07 MED ORDER — VILAZODONE HCL 20 MG PO TABS: 20.0000 mg | ORAL_TABLET | Freq: Every day | ORAL | 0 refills | Status: DC

## 2023-02-07 MED ORDER — ROSUVASTATIN CALCIUM 20 MG PO TABS
20.0000 mg | ORAL_TABLET | Freq: Every day | ORAL | 0 refills | Status: DC
Start: 2023-02-07 — End: 2023-03-07

## 2023-02-07 MED ORDER — ARIPIPRAZOLE 10 MG PO TABS
10.0000 mg | ORAL_TABLET | Freq: Every day | ORAL | 0 refills | Status: DC
  Filled 2023-02-07: qty 90, 90d supply, fill #0

## 2023-02-12 ENCOUNTER — Other Ambulatory Visit: Payer: Self-pay

## 2023-02-12 ENCOUNTER — Encounter: Payer: Self-pay | Admitting: Pharmacist

## 2023-02-14 ENCOUNTER — Other Ambulatory Visit: Payer: Self-pay

## 2023-02-20 ENCOUNTER — Other Ambulatory Visit (HOSPITAL_COMMUNITY): Payer: Self-pay

## 2023-02-21 ENCOUNTER — Other Ambulatory Visit: Payer: Self-pay | Admitting: Family Medicine

## 2023-02-21 ENCOUNTER — Other Ambulatory Visit: Payer: Self-pay | Admitting: Nurse Practitioner

## 2023-02-21 ENCOUNTER — Other Ambulatory Visit: Payer: Self-pay

## 2023-02-21 ENCOUNTER — Other Ambulatory Visit (HOSPITAL_COMMUNITY): Payer: Self-pay

## 2023-02-21 DIAGNOSIS — G40909 Epilepsy, unspecified, not intractable, without status epilepticus: Secondary | ICD-10-CM

## 2023-02-21 MED ORDER — LEVETIRACETAM 500 MG PO TABS: 500.0000 mg | ORAL_TABLET | Freq: Two times a day (BID) | ORAL | 0 refills | Status: DC

## 2023-02-22 ENCOUNTER — Other Ambulatory Visit (HOSPITAL_COMMUNITY): Payer: Self-pay

## 2023-02-22 ENCOUNTER — Other Ambulatory Visit: Payer: Self-pay | Admitting: Family Medicine

## 2023-02-22 ENCOUNTER — Other Ambulatory Visit: Payer: Self-pay

## 2023-02-22 DIAGNOSIS — G40909 Epilepsy, unspecified, not intractable, without status epilepticus: Secondary | ICD-10-CM

## 2023-02-22 MED ORDER — LANSOPRAZOLE 30 MG PO CPDR
30.0000 mg | DELAYED_RELEASE_CAPSULE | Freq: Every day | ORAL | 0 refills | Status: DC
  Filled 2023-02-22: qty 30, 30d supply, fill #0

## 2023-02-22 MED ORDER — LEVETIRACETAM 500 MG PO TABS
500.0000 mg | ORAL_TABLET | Freq: Two times a day (BID) | ORAL | 1 refills | Status: DC
  Filled 2023-02-22: qty 180, 90d supply, fill #0

## 2023-02-22 MED ORDER — ARIPIPRAZOLE 10 MG PO TABS
10.0000 mg | ORAL_TABLET | Freq: Every day | ORAL | 0 refills | Status: DC
  Filled 2023-02-22: qty 30, 30d supply, fill #0

## 2023-02-24 DIAGNOSIS — G4733 Obstructive sleep apnea (adult) (pediatric): Secondary | ICD-10-CM | POA: Diagnosis not present

## 2023-03-05 ENCOUNTER — Other Ambulatory Visit: Payer: Self-pay | Admitting: Nurse Practitioner

## 2023-03-05 MED ORDER — ARIPIPRAZOLE 10 MG PO TABS
10.0000 mg | ORAL_TABLET | Freq: Every day | ORAL | 1 refills | Status: DC
  Filled 2023-03-07: qty 90, 90d supply, fill #0
  Filled 2023-05-19: qty 90, 90d supply, fill #1

## 2023-03-05 NOTE — Progress Notes (Signed)
Meds ordered this encounter  Medications   ARIPiprazole (ABILIFY) 10 MG tablet    Sig: Take 1 tablet (10 mg total) by mouth daily.    Dispense:  90 tablet    Refill:  1    Supervising Provider:   Arville Care A [[id number]]   Mary-Margaret Daphine Deutscher, FNP

## 2023-03-07 ENCOUNTER — Other Ambulatory Visit (HOSPITAL_COMMUNITY): Payer: Self-pay

## 2023-03-07 ENCOUNTER — Other Ambulatory Visit: Payer: Self-pay | Admitting: Nurse Practitioner

## 2023-03-07 ENCOUNTER — Other Ambulatory Visit: Payer: Self-pay

## 2023-03-07 DIAGNOSIS — E782 Mixed hyperlipidemia: Secondary | ICD-10-CM

## 2023-03-07 MED ORDER — ROSUVASTATIN CALCIUM 20 MG PO TABS
20.0000 mg | ORAL_TABLET | Freq: Every day | ORAL | 1 refills | Status: DC
  Filled 2023-03-07: qty 90, 90d supply, fill #0
  Filled 2023-06-09: qty 90, 90d supply, fill #1

## 2023-03-07 MED ORDER — VILAZODONE HCL 20 MG PO TABS
20.0000 mg | ORAL_TABLET | Freq: Every day | ORAL | 1 refills | Status: DC
  Filled 2023-03-07 – 2023-05-19 (×2): qty 90, 90d supply, fill #0

## 2023-03-07 MED ORDER — LANSOPRAZOLE 30 MG PO CPDR
30.0000 mg | DELAYED_RELEASE_CAPSULE | Freq: Every day | ORAL | 0 refills | Status: DC
  Filled 2023-03-07: qty 90, 90d supply, fill #0

## 2023-03-08 ENCOUNTER — Other Ambulatory Visit: Payer: Self-pay

## 2023-03-08 ENCOUNTER — Other Ambulatory Visit (HOSPITAL_COMMUNITY): Payer: Self-pay

## 2023-03-18 ENCOUNTER — Telehealth: Payer: Self-pay | Admitting: Nurse Practitioner

## 2023-03-18 ENCOUNTER — Other Ambulatory Visit: Payer: Self-pay

## 2023-03-18 MED ORDER — CLINDAMYCIN HCL 300 MG PO CAPS: 300.0000 mg | ORAL_CAPSULE | Freq: Four times a day (QID) | ORAL | 0 refills | Status: DC

## 2023-03-18 NOTE — Telephone Encounter (Signed)
 Patient went to dentist with tooth pain last week. Told he needed tooth pulled. They gave him nothing. He woke up this morning with left facila swelling and pain. Dentist will n ot return call. Needs antibiotic. Need  to make sure infection doe snot go  to his heart. Meds ordered this encounter  Medications   clindamycin  (CLEOCIN ) 300 MG capsule    Sig: Take 1 capsule (300 mg total) by mouth 4 (four) times daily.    Dispense:  40 capsule    Refill:  0    Supervising Provider:   Jolyne Needs A [[id number]]   Mary-Margaret Gaylyn Keas, FNP

## 2023-05-20 ENCOUNTER — Other Ambulatory Visit (HOSPITAL_COMMUNITY): Payer: Self-pay

## 2023-05-22 ENCOUNTER — Ambulatory Visit: Payer: Commercial Managed Care - PPO | Admitting: Family Medicine

## 2023-05-29 ENCOUNTER — Ambulatory Visit (INDEPENDENT_AMBULATORY_CARE_PROVIDER_SITE_OTHER): Payer: Commercial Managed Care - PPO | Admitting: Family Medicine

## 2023-05-29 ENCOUNTER — Encounter: Payer: Self-pay | Admitting: Family Medicine

## 2023-05-29 ENCOUNTER — Telehealth: Payer: Self-pay | Admitting: Family Medicine

## 2023-05-29 VITALS — BP 148/78 | HR 49 | Ht >= 80 in | Wt 306.5 lb

## 2023-05-29 DIAGNOSIS — R413 Other amnesia: Secondary | ICD-10-CM | POA: Diagnosis not present

## 2023-05-29 DIAGNOSIS — G40909 Epilepsy, unspecified, not intractable, without status epilepticus: Secondary | ICD-10-CM | POA: Diagnosis not present

## 2023-05-29 NOTE — Patient Instructions (Signed)
 Below is our plan:  We will continue levetiracetam  500mg  twice daily. We will send referral to neuropsychology for formal neurocognitive testing. Let me know if you have any questions or do not hear back.   Please make sure you are consistent with timing of seizure medication. I recommend annual visit with primary care provider (PCP) for complete physical and routine blood work. I recommend daily intake of vitamin D  (400-800iu) and calcium  (800-1000mg ) for bone health. Discuss Dexa screening with PCP.   According to [place] law, you can not drive unless you are seizure / syncope free for at least 6 months and under physician's care.  Please maintain precautions. Do not participate in activities where a loss of awareness could harm you or someone else. No swimming alone, no tub bathing, no hot tubs, no driving, no operating motorized vehicles (cars, ATVs, motocycles, etc), lawnmowers, power tools or firearms. No standing at heights, such as rooftops, ladders or stairs. Avoid hot objects such as stoves, heaters, open fires. Wear a helmet when riding a bicycle, scooter, skateboard, etc. and avoid areas of traffic. Set your water heater to 120 degrees or less.  SUDEP is the sudden, unexpected death of someone with epilepsy, who was otherwise healthy. In SUDEP cases, no other cause of death is found when an autopsy is done. Each year, more than 1 in 1,000 people with epilepsy die from SUDEP. This is the leading cause of death in people with uncontrolled seizures. Until further answers are available, the best way to prevent SUDEP is to lower your risk by controlling seizures. Research has found that people with all types of epilepsy that experience convulsive seizures can be at risk.  Please make sure you are staying well hydrated. I recommend 50-60 ounces daily. Well balanced diet and regular exercise encouraged. Consistent sleep schedule with 6-8 hours recommended.   Please continue follow up with care team as  directed.   Follow up with me in 1 year   You may receive a survey regarding today's visit. I encourage you to leave honest feed back as I do use this information to improve patient care. Thank you for seeing me today!

## 2023-05-29 NOTE — Telephone Encounter (Signed)
 Referral neuropsychology fax to [facility]. Phone: [phone], Fax: [phone]
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
## 2023-06-10 ENCOUNTER — Other Ambulatory Visit (HOSPITAL_COMMUNITY): Payer: Self-pay

## 2023-08-08 ENCOUNTER — Ambulatory Visit (INDEPENDENT_AMBULATORY_CARE_PROVIDER_SITE_OTHER): Admitting: Nurse Practitioner

## 2023-08-08 ENCOUNTER — Ambulatory Visit (INDEPENDENT_AMBULATORY_CARE_PROVIDER_SITE_OTHER)

## 2023-08-08 ENCOUNTER — Other Ambulatory Visit (HOSPITAL_COMMUNITY): Payer: Self-pay

## 2023-08-08 ENCOUNTER — Other Ambulatory Visit: Payer: Self-pay

## 2023-08-08 ENCOUNTER — Encounter: Payer: Self-pay | Admitting: Nurse Practitioner

## 2023-08-08 VITALS — BP 126/80 | HR 67 | Temp 97.1°F | Ht >= 80 in | Wt 301.0 lb

## 2023-08-08 DIAGNOSIS — F411 Generalized anxiety disorder: Secondary | ICD-10-CM

## 2023-08-08 DIAGNOSIS — Q87410 Marfan's syndrome with aortic dilation: Secondary | ICD-10-CM

## 2023-08-08 DIAGNOSIS — G40909 Epilepsy, unspecified, not intractable, without status epilepticus: Secondary | ICD-10-CM

## 2023-08-08 DIAGNOSIS — I10 Essential (primary) hypertension: Secondary | ICD-10-CM

## 2023-08-08 DIAGNOSIS — E782 Mixed hyperlipidemia: Secondary | ICD-10-CM

## 2023-08-08 DIAGNOSIS — Z0001 Encounter for general adult medical examination with abnormal findings: Secondary | ICD-10-CM

## 2023-08-08 DIAGNOSIS — Z0000 Encounter for general adult medical examination without abnormal findings: Secondary | ICD-10-CM

## 2023-08-08 DIAGNOSIS — R52 Pain, unspecified: Secondary | ICD-10-CM

## 2023-08-08 DIAGNOSIS — Z139 Encounter for screening, unspecified: Secondary | ICD-10-CM | POA: Diagnosis not present

## 2023-08-08 LAB — LIPID PANEL

## 2023-08-08 MED ORDER — ROSUVASTATIN CALCIUM 20 MG PO TABS
20.0000 mg | ORAL_TABLET | Freq: Every day | ORAL | 1 refills | Status: DC
  Filled 2023-08-08 – 2023-09-01 (×3): qty 90, 90d supply, fill #0
  Filled 2023-12-04: qty 90, 90d supply, fill #1

## 2023-08-08 MED ORDER — LEVETIRACETAM 500 MG PO TABS
500.0000 mg | ORAL_TABLET | Freq: Two times a day (BID) | ORAL | 1 refills | Status: DC
  Filled 2023-08-08 – 2023-08-12 (×2): qty 180, 90d supply, fill #0
  Filled 2023-11-05: qty 180, 90d supply, fill #1

## 2023-08-08 MED ORDER — ARIPIPRAZOLE 10 MG PO TABS
10.0000 mg | ORAL_TABLET | Freq: Every day | ORAL | 1 refills | Status: DC
  Filled 2023-08-08 – 2023-08-12 (×2): qty 90, 90d supply, fill #0
  Filled 2023-11-05: qty 90, 90d supply, fill #1

## 2023-08-08 MED ORDER — LANSOPRAZOLE 30 MG PO CPDR
30.0000 mg | DELAYED_RELEASE_CAPSULE | Freq: Every day | ORAL | 1 refills | Status: DC
  Filled 2023-08-08: qty 90, 90d supply, fill #0
  Filled 2023-11-05: qty 90, 90d supply, fill #1

## 2023-08-08 MED ORDER — LOSARTAN POTASSIUM 25 MG PO TABS
25.0000 mg | ORAL_TABLET | Freq: Two times a day (BID) | ORAL | 1 refills | Status: DC
  Filled 2023-08-08: qty 180, 90d supply, fill #0
  Filled 2023-11-05: qty 180, 90d supply, fill #1

## 2023-08-08 MED ORDER — VILAZODONE HCL 20 MG PO TABS
20.0000 mg | ORAL_TABLET | Freq: Every day | ORAL | 1 refills | Status: DC
  Filled 2023-08-08 – 2023-08-12 (×2): qty 90, 90d supply, fill #0
  Filled 2023-11-05: qty 90, 90d supply, fill #1

## 2023-08-08 MED ORDER — METOPROLOL TARTRATE 100 MG PO TABS
100.0000 mg | ORAL_TABLET | Freq: Two times a day (BID) | ORAL | 3 refills | Status: DC
  Filled 2023-08-08: qty 180, 90d supply, fill #0
  Filled 2023-11-05: qty 180, 90d supply, fill #1
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
## 2023-08-08 NOTE — Patient Instructions (Signed)

## 2023-08-09 ENCOUNTER — Ambulatory Visit: Payer: Self-pay | Admitting: Nurse Practitioner

## 2023-08-09 LAB — LIPID PANEL
Chol/HDL Ratio: 3.1 ratio (ref 0.0–5.0)
Cholesterol, Total: 121 mg/dL (ref 100–199)
HDL: 39 mg/dL — ABNORMAL LOW (ref >39)
LDL Chol Calc (NIH): 63 mg/dL (ref 0–99)
Triglycerides: 99 mg/dL (ref 0–149)
VLDL Cholesterol Cal: 19 mg/dL (ref 5–40)

## 2023-08-09 LAB — CBC WITH DIFFERENTIAL/PLATELET
Basophils Absolute: 0.1 x10E3/uL (ref 0.0–0.2)
Basos: 1 %
EOS (ABSOLUTE): 0.3 x10E3/uL (ref 0.0–0.4)
Eos: 6 %
Hematocrit: 46.3 % (ref 37.5–51.0)
Hemoglobin: 15.1 g/dL (ref 13.0–17.7)
Immature Grans (Abs): 0 x10E3/uL (ref 0.0–0.1)
Immature Granulocytes: 0 %
Lymphocytes Absolute: 2.2 x10E3/uL (ref 0.7–3.1)
Lymphs: 37 %
MCH: 27.3 pg (ref 26.6–33.0)
MCHC: 32.6 g/dL (ref 31.5–35.7)
MCV: 84 fL (ref 79–97)
Monocytes Absolute: 0.5 x10E3/uL (ref 0.1–0.9)
Monocytes: 8 %
Neutrophils Absolute: 2.8 x10E3/uL (ref 1.4–7.0)
Neutrophils: 48 %
Platelets: 200 x10E3/uL (ref 150–450)
RBC: 5.53 x10E6/uL (ref 4.14–5.80)
RDW: 13.2 % (ref 11.6–15.4)
WBC: 6 x10E3/uL (ref 3.4–10.8)

## 2023-08-09 LAB — CMP14+EGFR
ALT: 20 IU/L (ref 0–44)
AST: 18 IU/L (ref 0–40)
Albumin: 4.8 g/dL (ref 4.1–5.1)
Alkaline Phosphatase: 105 IU/L (ref 44–121)
BUN/Creatinine Ratio: 9 (ref 9–20)
BUN: 10 mg/dL (ref 6–24)
Bilirubin Total: 0.8 mg/dL (ref 0.0–1.2)
CO2: 27 mmol/L (ref 20–29)
Calcium: 9.1 mg/dL (ref 8.7–10.2)
Chloride: 101 mmol/L (ref 96–106)
Creatinine, Ser: 1.11 mg/dL (ref 0.76–1.27)
Globulin, Total: 2.3 g/dL (ref 1.5–4.5)
Glucose: 72 mg/dL (ref 70–99)
Potassium: 4.5 mmol/L (ref 3.5–5.2)
Sodium: 141 mmol/L (ref 134–144)
Total Protein: 7.1 g/dL (ref 6.0–8.5)
eGFR: 82 mL/min/1.73 (ref >59)

## 2023-08-12 ENCOUNTER — Other Ambulatory Visit (HOSPITAL_COMMUNITY): Payer: Self-pay

## 2023-08-13 ENCOUNTER — Other Ambulatory Visit (HOSPITAL_COMMUNITY): Payer: Self-pay

## 2023-08-13 ENCOUNTER — Other Ambulatory Visit: Payer: Self-pay

## 2023-08-14 ENCOUNTER — Other Ambulatory Visit: Payer: Self-pay

## 2023-08-18 NOTE — Progress Notes (Unsigned)
 Cardiology Office Note:   Date:  08/21/2023  ID:  [facility], DOB 1975/12/04, MRN [id number] PCP: Gladis Mustard, FNP  [facility] Providers Cardiologist:  Carl Schilling, MD {  History of Present Illness:   [facility] is a 48 y.o. male who presents for follow up of Marfan's.  He is status post aortic root reconstruction with valve sparing.  34-mm Hemashield graft and replacement of ascending aorta and hemiarch with a 24-mm Hemashield graft in August,2006 (Dr. Theresa) .   Prior to surgery he had no obstructive CAD on CT.        He was previously seen by a cardiologist at [facility] .  In 2021 EF is 55 to 60%.  There is no pericardial effusion.  There was moderate left ventricular hypertrophy.  He had a CT at that time which demonstrated a curvilinear hypodense structure near the aortic root which did not conform with the expected aortic valvular contour and was thought to be possibly a localized dissection.  I ordered a CT angiogram in September 2023.  He had stable aortic root repair.  There were no acute pulmonary findings.   He returns for follow-up.  Since I last saw him he had 1 day of chest discomfort when he was emotionally stressed because of his middle 27 year old child.  He otherwise is able to be physically active around the house.  He does not exercise routinely but he does a lot of chores for his wife and has not had any new symptoms.  He denies any chest pressure other than that 1 day.  He has not had any new shortness of breath, PND or orthopnea.  He has had no new palpitations, presyncope or syncope.  He has had no weight gain or edema.  ROS: As stated in the HPI and negative for all other systems.  Studies Reviewed:    EKG:   EKG Interpretation Date/Time:  Wednesday August 21 2023 15:25:08 EDT Ventricular Rate:  60 PR Interval:  246 QRS Duration:  158 QT Interval:  466 QTC Calculation: 466 R Axis:   71  Text Interpretation: Sinus rhythm with 1st degree A-V  block Right bundle branch block T wave abnormality, consider inferior ischemia When compared with ECG of 02-Oct-2013 09:22, No significant change since last tracing Confirmed by [facility] ([id number]) on 08/21/2023 4:03:43 PM    Risk Assessment/Calculations:              Physical Exam:   VS:  BP 118/80   Pulse 60   Ht 7' (2.134 m)   Wt 298 lb (135.2 kg)   BMI 29.69 kg/m    Wt Readings from Last 3 Encounters:  08/21/23 298 lb (135.2 kg)  08/08/23 (!) 301 lb (136.5 kg)  05/29/23 (!) 306 lb 8 oz (139 kg)     GEN: Well nourished, well developed in no acute distress NECK: No JVD; right carotid bruits CARDIAC: RRR, no murmurs, rubs, gallops RESPIRATORY:  Clear to auscultation without rales, wheezing or rhonchi  ABDOMEN: Soft, non-tender, non-distended EXTREMITIES:  No edema; No deformity   ASSESSMENT AND PLAN:   Marfan syndrome status post aortic root reconstruction with valve sparing using a 34-mm Hemashield graft and replacement of ascending aorta for aortic aneurysm in August of 2006: No further imaging.  He had stable repair as of 2023.  Dyslipidemia: LDL was 63.  No change in therapy.   HTN: Blood pressure is at target.  No change in therapy.   Bruit:  I will check carotid Dopplers.  I looked through previous records from [facility] and do not see this.    Follow up with me in 1 year.  Signed, Carl Schilling, MD

## 2023-08-19 DIAGNOSIS — G4733 Obstructive sleep apnea (adult) (pediatric): Secondary | ICD-10-CM | POA: Diagnosis not present

## 2023-08-21 ENCOUNTER — Ambulatory Visit (INDEPENDENT_AMBULATORY_CARE_PROVIDER_SITE_OTHER): Admitting: Cardiology

## 2023-08-21 ENCOUNTER — Encounter: Payer: Self-pay | Admitting: Cardiology

## 2023-08-21 VITALS — BP 118/80 | HR 60 | Ht >= 80 in | Wt 298.0 lb

## 2023-08-21 DIAGNOSIS — R0989 Other specified symptoms and signs involving the circulatory and respiratory systems: Secondary | ICD-10-CM | POA: Diagnosis not present

## 2023-08-21 DIAGNOSIS — E785 Hyperlipidemia, unspecified: Secondary | ICD-10-CM

## 2023-08-21 DIAGNOSIS — Q87410 Marfan's syndrome with aortic dilation: Secondary | ICD-10-CM

## 2023-08-21 DIAGNOSIS — I10 Essential (primary) hypertension: Secondary | ICD-10-CM

## 2023-08-21 NOTE — Patient Instructions (Signed)
 Medication Instructions:   Your physician recommends that you continue on your current medications as directed. Please refer to the Current Medication list given to you today.   Labwork: None today  Testing/Procedures: Your physician has requested that you have a carotid duplex. This test is an ultrasound of the carotid arteries in your neck. It looks at blood flow through these arteries that supply the brain with blood. Allow one hour for this exam. There are no restrictions or special instructions.   Follow-Up: 1 year Dr.Hochrein  Any Other Special Instructions Will Be Listed Below (If Applicable).  If you need a refill on your cardiac medications before your next appointment, please call your pharmacy.

## 2023-09-02 ENCOUNTER — Other Ambulatory Visit (HOSPITAL_COMMUNITY): Payer: Self-pay

## 2023-09-02 ENCOUNTER — Other Ambulatory Visit: Payer: Self-pay

## 2023-09-11 ENCOUNTER — Ambulatory Visit (HOSPITAL_COMMUNITY)
Admission: RE | Admit: 2023-09-11 | Discharge: 2023-09-11 | Disposition: A | Discharge status: Home / Self Care | Source: Ambulatory Visit | Attending: Cardiology | Admitting: Cardiology

## 2023-09-11 DIAGNOSIS — I10 Essential (primary) hypertension: Secondary | ICD-10-CM | POA: Diagnosis not present

## 2023-09-11 DIAGNOSIS — R0989 Other specified symptoms and signs involving the circulatory and respiratory systems: Secondary | ICD-10-CM | POA: Diagnosis not present

## 2023-09-11 DIAGNOSIS — E785 Hyperlipidemia, unspecified: Secondary | ICD-10-CM | POA: Diagnosis not present

## 2023-09-19 ENCOUNTER — Ambulatory Visit: Payer: Self-pay | Admitting: Cardiology

## 2023-10-01 DIAGNOSIS — R4189 Other symptoms and signs involving cognitive functions and awareness: Secondary | ICD-10-CM | POA: Diagnosis not present

## 2023-10-01 DIAGNOSIS — R413 Other amnesia: Secondary | ICD-10-CM | POA: Diagnosis not present

## 2023-10-01 DIAGNOSIS — F418 Other specified anxiety disorders: Secondary | ICD-10-CM | POA: Diagnosis not present

## 2023-10-16 ENCOUNTER — Other Ambulatory Visit (HOSPITAL_COMMUNITY): Payer: Self-pay

## 2023-11-06 ENCOUNTER — Other Ambulatory Visit (HOSPITAL_COMMUNITY): Payer: Self-pay

## 2023-12-05 ENCOUNTER — Other Ambulatory Visit (HOSPITAL_COMMUNITY): Payer: Self-pay

## 2023-12-05 ENCOUNTER — Other Ambulatory Visit: Payer: Self-pay

## 2024-02-04 ENCOUNTER — Other Ambulatory Visit (HOSPITAL_COMMUNITY): Payer: Self-pay

## 2024-02-04 ENCOUNTER — Ambulatory Visit: Admitting: Nurse Practitioner

## 2024-02-04 ENCOUNTER — Encounter: Payer: Self-pay | Admitting: Nurse Practitioner

## 2024-02-04 VITALS — BP 121/73 | HR 57 | Temp 97.2°F | Ht >= 80 in | Wt 308.0 lb

## 2024-02-04 DIAGNOSIS — Q87410 Marfan's syndrome with aortic dilation: Secondary | ICD-10-CM

## 2024-02-04 DIAGNOSIS — I10 Essential (primary) hypertension: Secondary | ICD-10-CM | POA: Diagnosis not present

## 2024-02-04 DIAGNOSIS — G40909 Epilepsy, unspecified, not intractable, without status epilepticus: Secondary | ICD-10-CM

## 2024-02-04 DIAGNOSIS — F411 Generalized anxiety disorder: Secondary | ICD-10-CM

## 2024-02-04 DIAGNOSIS — E782 Mixed hyperlipidemia: Secondary | ICD-10-CM

## 2024-02-04 MED ORDER — METOPROLOL TARTRATE 100 MG PO TABS
100.0000 mg | ORAL_TABLET | Freq: Two times a day (BID) | ORAL | 1 refills | Status: AC
  Filled 2024-02-04: qty 180, 90d supply, fill #0

## 2024-02-04 MED ORDER — ROSUVASTATIN CALCIUM 20 MG PO TABS
20.0000 mg | ORAL_TABLET | Freq: Every day | ORAL | 1 refills | Status: AC
  Filled 2024-02-04 – 2024-03-05 (×2): qty 90, 90d supply, fill #0

## 2024-02-04 MED ORDER — LOSARTAN POTASSIUM 25 MG PO TABS
25.0000 mg | ORAL_TABLET | Freq: Two times a day (BID) | ORAL | 1 refills | Status: AC
  Filled 2024-02-04: qty 180, 90d supply, fill #0

## 2024-02-04 MED ORDER — LEVETIRACETAM 500 MG PO TABS
500.0000 mg | ORAL_TABLET | Freq: Two times a day (BID) | ORAL | 1 refills | Status: AC
  Filled 2024-02-04: qty 180, 90d supply, fill #0

## 2024-02-04 MED ORDER — ARIPIPRAZOLE 10 MG PO TABS
10.0000 mg | ORAL_TABLET | Freq: Every day | ORAL | 1 refills | Status: AC
  Filled 2024-02-04: qty 90, 90d supply, fill #0

## 2024-02-04 MED ORDER — VILAZODONE HCL 20 MG PO TABS
20.0000 mg | ORAL_TABLET | Freq: Every day | ORAL | 1 refills | Status: AC
  Filled 2024-02-04: qty 90, 90d supply, fill #0

## 2024-02-04 MED ORDER — LANSOPRAZOLE 30 MG PO CPDR
30.0000 mg | DELAYED_RELEASE_CAPSULE | Freq: Every day | ORAL | 1 refills | Status: AC
  Filled 2024-02-04: qty 90, 90d supply, fill #0
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
## 2024-02-04 NOTE — Patient Instructions (Signed)

## 2024-02-05 ENCOUNTER — Other Ambulatory Visit (HOSPITAL_COMMUNITY): Payer: Self-pay

## 2024-02-05 ENCOUNTER — Other Ambulatory Visit: Payer: Self-pay

## 2024-02-05 ENCOUNTER — Ambulatory Visit: Payer: Self-pay | Admitting: Nurse Practitioner

## 2024-02-05 LAB — CBC WITH DIFFERENTIAL/PLATELET
Basophils Absolute: 0 x10E3/uL (ref 0.0–0.2)
Basos: 1 %
EOS (ABSOLUTE): 0.3 x10E3/uL (ref 0.0–0.4)
Eos: 5 %
Hematocrit: 41.9 % (ref 37.5–51.0)
Hemoglobin: 14.1 g/dL (ref 13.0–17.7)
Immature Grans (Abs): 0 x10E3/uL (ref 0.0–0.1)
Immature Granulocytes: 0 %
Lymphocytes Absolute: 2.4 x10E3/uL (ref 0.7–3.1)
Lymphs: 40 %
MCH: 28.1 pg (ref 26.6–33.0)
MCHC: 33.7 g/dL (ref 31.5–35.7)
MCV: 84 fL (ref 79–97)
Monocytes Absolute: 0.6 x10E3/uL (ref 0.1–0.9)
Monocytes: 10 %
Neutrophils Absolute: 2.7 x10E3/uL (ref 1.4–7.0)
Neutrophils: 44 %
Platelets: 219 x10E3/uL (ref 150–450)
RBC: 5.02 x10E6/uL (ref 4.14–5.80)
RDW: 13.5 % (ref 11.6–15.4)
WBC: 6 x10E3/uL (ref 3.4–10.8)

## 2024-02-05 LAB — LIPID PANEL
Chol/HDL Ratio: 3.1 ratio (ref 0.0–5.0)
Cholesterol, Total: 107 mg/dL (ref 100–199)
HDL: 35 mg/dL — ABNORMAL LOW
LDL Chol Calc (NIH): 49 mg/dL (ref 0–99)
Triglycerides: 131 mg/dL (ref 0–149)
VLDL Cholesterol Cal: 23 mg/dL (ref 5–40)

## 2024-02-05 LAB — CMP14+EGFR
ALT: 17 IU/L (ref 0–44)
AST: 17 IU/L (ref 0–40)
Albumin: 4.5 g/dL (ref 4.1–5.1)
Alkaline Phosphatase: 105 IU/L (ref 47–123)
BUN/Creatinine Ratio: 13 (ref 9–20)
BUN: 12 mg/dL (ref 6–24)
Bilirubin Total: 0.4 mg/dL (ref 0.0–1.2)
CO2: 28 mmol/L (ref 20–29)
Calcium: 9.6 mg/dL (ref 8.7–10.2)
Chloride: 101 mmol/L (ref 96–106)
Creatinine, Ser: 0.93 mg/dL (ref 0.76–1.27)
Globulin, Total: 2 g/dL (ref 1.5–4.5)
Glucose: 72 mg/dL (ref 70–99)
Potassium: 4 mmol/L (ref 3.5–5.2)
Sodium: 140 mmol/L (ref 134–144)
Total Protein: 6.5 g/dL (ref 6.0–8.5)
eGFR: 102 mL/min/1.73

## 2024-03-06 ENCOUNTER — Other Ambulatory Visit: Payer: Self-pay

## 2024-03-06 ENCOUNTER — Other Ambulatory Visit (HOSPITAL_COMMUNITY): Payer: Self-pay

## 2024-06-03 ENCOUNTER — Ambulatory Visit: Admitting: Family Medicine

## 2024-11-17 ENCOUNTER — Ambulatory Visit: Admitting: Family Medicine
